# Patient Record
Sex: Male | Born: 1960 | Race: White | Hispanic: No | Marital: Married | Smoking: Never smoker
Health system: Southern US, Community
[De-identification: ages and names within clinical notes are randomized; demographics above are authoritative.]

## PROBLEM LIST (undated history)

## (undated) DIAGNOSIS — E669 Obesity, unspecified: Principal | ICD-10-CM

## (undated) DIAGNOSIS — K922 Gastrointestinal hemorrhage, unspecified: Secondary | ICD-10-CM

## (undated) DIAGNOSIS — I5042 Chronic combined systolic (congestive) and diastolic (congestive) heart failure: Secondary | ICD-10-CM

## (undated) DIAGNOSIS — I255 Ischemic cardiomyopathy: Secondary | ICD-10-CM

## (undated) DIAGNOSIS — I2510 Atherosclerotic heart disease of native coronary artery without angina pectoris: Secondary | ICD-10-CM

## (undated) DIAGNOSIS — M1990 Unspecified osteoarthritis, unspecified site: Secondary | ICD-10-CM

## (undated) DIAGNOSIS — E785 Hyperlipidemia, unspecified: Secondary | ICD-10-CM

## (undated) DIAGNOSIS — G4733 Obstructive sleep apnea (adult) (pediatric): Secondary | ICD-10-CM

## (undated) DIAGNOSIS — I219 Acute myocardial infarction, unspecified: Secondary | ICD-10-CM

## (undated) DIAGNOSIS — Z9989 Dependence on other enabling machines and devices: Secondary | ICD-10-CM

## (undated) DIAGNOSIS — D509 Iron deficiency anemia, unspecified: Secondary | ICD-10-CM

## (undated) HISTORY — DX: Ischemic cardiomyopathy: I25.5

## (undated) HISTORY — DX: Obesity, unspecified: E66.9

## (undated) HISTORY — DX: Hyperlipidemia, unspecified: E78.5

---

## 2013-07-22 ENCOUNTER — Institutional Professional Consult (permissible substitution): Payer: Self-pay | Admitting: Neurology

## 2013-08-01 DIAGNOSIS — G4733 Obstructive sleep apnea (adult) (pediatric): Secondary | ICD-10-CM

## 2013-08-01 DIAGNOSIS — E66811 Obesity, class 1: Secondary | ICD-10-CM

## 2013-08-01 DIAGNOSIS — E669 Obesity, unspecified: Secondary | ICD-10-CM

## 2013-08-01 HISTORY — DX: Obesity, class 1: E66.811

## 2013-08-01 HISTORY — DX: Obstructive sleep apnea (adult) (pediatric): G47.33

## 2013-08-01 HISTORY — DX: Obesity, unspecified: E66.9

## 2013-08-05 ENCOUNTER — Encounter (INDEPENDENT_AMBULATORY_CARE_PROVIDER_SITE_OTHER): Payer: Self-pay

## 2013-08-05 ENCOUNTER — Encounter: Payer: Self-pay | Admitting: Neurology

## 2013-08-05 ENCOUNTER — Ambulatory Visit (INDEPENDENT_AMBULATORY_CARE_PROVIDER_SITE_OTHER): Payer: Managed Care, Other (non HMO) | Admitting: Neurology

## 2013-08-05 VITALS — BP 154/96 | HR 77 | Resp 16 | Ht 71.0 in | Wt 283.0 lb

## 2013-08-05 DIAGNOSIS — E669 Obesity, unspecified: Secondary | ICD-10-CM | POA: Insufficient documentation

## 2013-08-05 DIAGNOSIS — R0989 Other specified symptoms and signs involving the circulatory and respiratory systems: Secondary | ICD-10-CM

## 2013-08-05 DIAGNOSIS — R0609 Other forms of dyspnea: Secondary | ICD-10-CM

## 2013-08-05 DIAGNOSIS — R0683 Snoring: Secondary | ICD-10-CM | POA: Insufficient documentation

## 2013-08-05 NOTE — Patient Instructions (Addendum)
Obesity Obesity is defined as having too much total body fat and a body mass index (BMI) of 30 or more. BMI is an estimate of body fat and is calculated from your height and weight. Obesity happens when you consume more calories than you can burn by exercising or performing daily physical tasks. Prolonged obesity can cause major illnesses or emergencies, such as:   A stroke.  Heart disease.  Diabetes.  Cancer.  Arthritis.  High blood pressure (hypertension).  High cholesterol.  Sleep apnea.  Erectile dysfunction.  Infertility problems. CAUSES   Regularly eating unhealthy foods.  Physical inactivity.  Certain disorders, such as an underactive thyroid (hypothyroidism), Cushing's syndrome, and polycystic ovarian syndrome.  Certain medicines, such as steroids, some depression medicines, and antipsychotics.  Genetics.  Lack of sleep. DIAGNOSIS  A caregiver can diagnose obesity after calculating your BMI. Obesity will be diagnosed if your BMI is 30 or higher.  There are other methods of measuring obesity levels. Some other methods include measuring your skin fold thickness, your waist circumference, and comparing your hip circumference to your waist circumference. TREATMENT  A healthy treatment program includes some or all of the following:  Long-term dietary changes.  Exercise and physical activity.  Behavioral and lifestyle changes.  Medicine only under the supervision of your caregiver. Medicines may help, but only if they are used with diet and exercise programs. An unhealthy treatment program includes:  Fasting.  Fad diets.  Supplements and drugs. These choices do not succeed in long-term weight control.  HOME CARE INSTRUCTIONS   Exercise and perform physical activity as directed by your caregiver. To increase physical activity, try the following:  Use stairs instead of elevators.  Park farther away from store entrances.  Garden, bike, or walk instead of  watching television or using the computer.  Eat healthy, low-calorie foods and drinks on a regular basis. Eat more fruits and vegetables. Use low-calorie cookbooks or take healthy cooking classes.  Limit fast food, sweets, and processed snack foods.  Eat smaller portions.  Keep a daily journal of everything you eat. There are many free websites to help you with this. It may be helpful to measure your foods so you can determine if you are eating the correct portion sizes.  Avoid drinking alcohol. Drink more water and drinks without calories.  Take vitamins and supplements only as recommended by your caregiver.  Weight-loss support groups, Nurse, mental health, counselors, and stress reduction education can also be very helpful. SEEK IMMEDIATE MEDICAL CARE IF:  You have chest pain or tightness.  You have trouble breathing or feel short of breath.  You have weakness or leg numbness.  You feel confused or have trouble talking.  You have sudden changes in your vision. MAKE SURE YOU:  Understand these instructions.  Will watch your condition.  Will get help right away if you are not doing well or get worse. Document Released: 08/25/2004 Document Revised: 01/17/2012 Document Reviewed: 08/24/2011 [facility] Patient Information 2014 [place]. Sleep Apnea Sleep apnea is disorder that affects a person's sleep. A person with sleep apnea has abnormal pauses in their breathing when they sleep. It is hard for them to get a good sleep. This makes a person tired during the day. It also can lead to other physical problems. There are three types of sleep apnea. One type is when breathing stops for a short time because your airway is blocked (obstructive sleep apnea). Another type is when the brain sometimes fails to give the normal  signal to breathe to the muscles that control your breathing (central sleep apnea). The third type is a combination of the other two types. HOME CARE  Do not  sleep on your back. Try to sleep on your side.  Take all medicine as told by your doctor.  Avoid alcohol, calming medicines (sedatives), and depressant drugs.  Try to lose weight if you are overweight. Talk to your doctor about a healthy weight goal. Your doctor may have you use a device that helps to open your airway. It can help you get the air that you need. It is called a positive airway pressure (PAP) device. There are three types of PAP devices:  Continuous positive airway pressure (CPAP) device.  Nasal expiratory positive airway pressure (EPAP) device.  Bilevel positive airway pressure (BPAP) device. MAKE SURE YOU:  Understand these instructions.  Will watch your condition.  Will get help right away if you are not doing well or get worse. Document Released: 04/26/2008 Document Revised: 07/04/2012 Document Reviewed: 11/19/2011 [facility] Patient Information 2014 [place].

## 2013-08-05 NOTE — Progress Notes (Signed)
[facility]  Provider:  Larey Seat, M D  Referring Provider: No ref. provider found Primary Care Physician:  No primary provider on file.  Chief Complaint  Patient presents with  . OSA    NP, paper referral, Rm 10    HPI:  James Mullins is a 53 y.o. cauc. Married , right handed  male .  Today seen here as a referral  from his occupational health provider , Dr. 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   James Mullins is aware that he is a snorer, but this has been mostly bothering his wife not himself. He endorsed some joint pain and some weight gain over the last 5 years, but he has no established health history that would interfere with performance and ability of a Engineer, materials.  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 While evaluated by occupational health doctor for the drivers exam , Dr Eden Emms  noted that the patient had some risk factors including a body mass index of 38, and neck circumference of over 70 inches, and now rule upper airway Mallampati 3-4 and a clinical symptom of severe snoring.  His occupational health exam also reported normal hearing ,  Vision 20/20 uncorrected vision,  and a blood pressure of 130/80. Snoring has become louder over the last 5 years , correlating with weight gain his wife reported.   He has irregular sleep habits, based on irregular work hours. He drove last night until 9 PM, went home and to bed at about 10 PM but was asked to drive possibly an early morning route.  External factors would be put aside, his desire is to go to bed between 10 and 11 PM. He normally falls asleep fairly promptly within 30 minutes or less.  he will have 1 or 2 nocturia breaks at night, but reports to fall back asleep  promptly again. If he has no other obligations he will sleep until 9 AM. During work days he will need an alarm. He does not drink any  caffeinated beverages. His workplace is about 10 minutes drive from his home.  The patient is basically on cold as a driver he may work 6 days 1 week and 3 days another week and  In various shifts. Average nightly sleep time may vary between 6 and 7 hours.  The patient uses snuff tobacco - but he  is a nonsmoker. He drinks  ( dealcoholized ) light beer , about 3 per day ,but not hard liquor. He is not a caffeine , ice tea or soda drinker.  No family history of sleep disorders.      Review of Systems: Out of a complete 14 system review, the patient complains of only the following symptoms, and all other reviewed systems are negative. The patient endorsed the Epworth sleepiness scale at 4 points, the fatigue severity scale at 22 points. Arthralgia .   History   Social History  . Marital Status: Married    Spouse Name: renee    Number of Children: 1  . Years of Education: college   Occupational History  . Not on file.   Social History Main Topics  . Smoking status: Never Smoker   . Smokeless tobacco: Not on file  . Alcohol Use: Yes  . Drug Use: No  . Sexual Activity: Not on file   Other Topics Concern  .  Not on file   Social History Narrative  . No narrative on file    Family History  Problem Relation Age of Onset  . Stroke Father     Past Medical History  Diagnosis Date  . Obesity (BMI 30.0-34.9)   . Snoring disorder 08/05/2013    Past Surgical History  Procedure Laterality Date  . None      No current outpatient prescriptions on file.   No current facility-administered medications for this visit.    Allergies as of 08/05/2013  . (No Known Allergies)    Vitals: BP 154/96  Pulse 77  Resp 16  Ht 5\' 11"  (1.803 m)  Wt 283 lb (128.368 kg)  BMI 39.49 kg/m2 Last Weight:  Wt Readings from Last 1 Encounters:  08/05/13 283 lb (128.368 kg)   Last Height:   Ht Readings from Last 1 Encounters:  08/05/13 5\' 11"  (1.803 m)   BMI  38   Physical  exam:  General: The patient is awake, alert and appears not in acute distress. The patient is well groomed. Head: Normocephalic, atraumatic. Neck is supple. Mallampati 3-4, neck circumference: 17.8 , inches, nasal passage clear.  He has seasonal rhinitis.  Cardiovascular:  Regular rate and rhythm , without  murmurs or carotid bruit, and without distended neck veins. Respiratory: Lungs are clear to auscultation. Skin:  Without evidence of edema, or rash Trunk: BMI is  elevated and patient has normal posture.  Neurologic exam : The patient is awake and alert, oriented to place and time.  Memory subjective  described as intact. There is a normal attention span & concentration ability.  Speech is fluent without dysarthria, dysphonia or aphasia. Mood and affect are appropriate.  Cranial nerves: Pupils are equal and briskly reactive to light. Funduscopic exam without  evidence of pallor or edema. Extraocular movements  in vertical and horizontal planes intact and without nystagmus. Visual fields by finger perimetry are intact. Hearing to finger rub intact.  Facial sensation intact to fine touch. Facial motor strength is symmetric and tongue and uvula move midline.  Motor exam:  Normal tone and normal muscle bulk and symmetric normal strength in all extremities.  Sensory:  Fine touch, pinprick and vibration were tested in all extremities. Proprioception is  normal.  Coordination: Rapid alternating movements in the fingers/hands is normal.  Finger-to-nose maneuver tested and normal without evidence of ataxia, dysmetria or tremor.  Gait and station: Patient walks without assistive device. Deep tendon reflexes: in the  upper and lower extremities are symmetric and intact. Babinski maneuver response is downgoing.   Assessment:  After physical and neurologic examination, review of laboratory studies, imaging, neurophysiology testing and pre-existing records, assessment is   1) patient is at high  risk for OSA.  2) shift work related irregular sleep times.  3) obesity 4) situational elevated blood pressure.   Plan:  Treatment plan and additional workup : Sleep study ASAP for DOT , needs 30 day compliance if placed on CPAP.  Weight loss by diet and exercise information and discussion.  35 minute discussion in visit.

## 2013-08-21 ENCOUNTER — Ambulatory Visit (INDEPENDENT_AMBULATORY_CARE_PROVIDER_SITE_OTHER): Payer: Managed Care, Other (non HMO)

## 2013-08-21 DIAGNOSIS — G4733 Obstructive sleep apnea (adult) (pediatric): Secondary | ICD-10-CM

## 2013-08-21 DIAGNOSIS — E669 Obesity, unspecified: Secondary | ICD-10-CM

## 2013-08-21 DIAGNOSIS — E66811 Obesity, class 1: Secondary | ICD-10-CM

## 2013-08-21 DIAGNOSIS — R0683 Snoring: Secondary | ICD-10-CM

## 2013-08-27 ENCOUNTER — Telehealth: Payer: Self-pay | Admitting: Neurology

## 2013-08-27 DIAGNOSIS — G4733 Obstructive sleep apnea (adult) (pediatric): Secondary | ICD-10-CM

## 2013-08-27 NOTE — Telephone Encounter (Signed)
I called and spoke with the patient about his recent sleep study results. I informed the patient that the study did reveal the diagnosis of moderate obstructive sleep apnea and Dr. Brett Fairy recommend CPAP therapy at home. I will send the order to [facility] and mail the report to the patient with a follow up instruction letter.

## 2013-08-28 ENCOUNTER — Encounter: Payer: Self-pay | Admitting: *Deleted

## 2013-08-30 ENCOUNTER — Telehealth: Payer: Self-pay | Admitting: *Deleted

## 2013-08-30 NOTE — Telephone Encounter (Signed)
Error

## 2015-09-02 DIAGNOSIS — I2510 Atherosclerotic heart disease of native coronary artery without angina pectoris: Secondary | ICD-10-CM

## 2015-09-02 DIAGNOSIS — D509 Iron deficiency anemia, unspecified: Secondary | ICD-10-CM

## 2015-09-02 DIAGNOSIS — I5042 Chronic combined systolic (congestive) and diastolic (congestive) heart failure: Secondary | ICD-10-CM

## 2015-09-02 HISTORY — DX: Iron deficiency anemia, unspecified: D50.9

## 2015-09-02 HISTORY — DX: Atherosclerotic heart disease of native coronary artery without angina pectoris: I25.10

## 2015-09-02 HISTORY — DX: Chronic combined systolic (congestive) and diastolic (congestive) heart failure: I50.42

## 2015-09-12 ENCOUNTER — Inpatient Hospital Stay (HOSPITAL_COMMUNITY)
Admission: EM | Admit: 2015-09-12 | Discharge: 2015-09-14 | DRG: 246 | Disposition: A | Discharge status: Home / Self Care | Payer: Managed Care, Other (non HMO) | Attending: Cardiology | Admitting: Cardiology

## 2015-09-12 ENCOUNTER — Inpatient Hospital Stay (HOSPITAL_COMMUNITY): Payer: Managed Care, Other (non HMO)

## 2015-09-12 ENCOUNTER — Encounter (HOSPITAL_COMMUNITY): Payer: Self-pay

## 2015-09-12 ENCOUNTER — Emergency Department (HOSPITAL_COMMUNITY): Payer: Managed Care, Other (non HMO)

## 2015-09-12 ENCOUNTER — Encounter (HOSPITAL_COMMUNITY)
Admission: EM | Disposition: A | Discharge status: Home / Self Care | Payer: Self-pay | Source: Home / Self Care | Attending: Cardiology

## 2015-09-12 DIAGNOSIS — Z955 Presence of coronary angioplasty implant and graft: Secondary | ICD-10-CM

## 2015-09-12 DIAGNOSIS — I2510 Atherosclerotic heart disease of native coronary artery without angina pectoris: Secondary | ICD-10-CM

## 2015-09-12 DIAGNOSIS — I5041 Acute combined systolic (congestive) and diastolic (congestive) heart failure: Secondary | ICD-10-CM | POA: Diagnosis present

## 2015-09-12 DIAGNOSIS — I2111 ST elevation (STEMI) myocardial infarction involving right coronary artery: Secondary | ICD-10-CM | POA: Diagnosis present

## 2015-09-12 DIAGNOSIS — G4733 Obstructive sleep apnea (adult) (pediatric): Secondary | ICD-10-CM | POA: Diagnosis present

## 2015-09-12 DIAGNOSIS — E669 Obesity, unspecified: Secondary | ICD-10-CM | POA: Diagnosis not present

## 2015-09-12 DIAGNOSIS — Z823 Family history of stroke: Secondary | ICD-10-CM | POA: Diagnosis not present

## 2015-09-12 DIAGNOSIS — D509 Iron deficiency anemia, unspecified: Secondary | ICD-10-CM | POA: Diagnosis present

## 2015-09-12 DIAGNOSIS — I213 ST elevation (STEMI) myocardial infarction of unspecified site: Secondary | ICD-10-CM

## 2015-09-12 DIAGNOSIS — I509 Heart failure, unspecified: Secondary | ICD-10-CM | POA: Diagnosis not present

## 2015-09-12 DIAGNOSIS — D649 Anemia, unspecified: Secondary | ICD-10-CM

## 2015-09-12 DIAGNOSIS — I429 Cardiomyopathy, unspecified: Secondary | ICD-10-CM | POA: Diagnosis present

## 2015-09-12 DIAGNOSIS — Z79899 Other long term (current) drug therapy: Secondary | ICD-10-CM | POA: Diagnosis not present

## 2015-09-12 DIAGNOSIS — I517 Cardiomegaly: Secondary | ICD-10-CM | POA: Diagnosis present

## 2015-09-12 DIAGNOSIS — E66811 Obesity, class 1: Secondary | ICD-10-CM | POA: Diagnosis present

## 2015-09-12 DIAGNOSIS — Z6841 Body Mass Index (BMI) 40.0 and over, adult: Secondary | ICD-10-CM

## 2015-09-12 DIAGNOSIS — I255 Ischemic cardiomyopathy: Secondary | ICD-10-CM | POA: Diagnosis not present

## 2015-09-12 DIAGNOSIS — D500 Iron deficiency anemia secondary to blood loss (chronic): Secondary | ICD-10-CM | POA: Diagnosis not present

## 2015-09-12 HISTORY — PX: CARDIAC CATHETERIZATION: SHX172

## 2015-09-12 HISTORY — DX: Dependence on other enabling machines and devices: Z99.89

## 2015-09-12 HISTORY — DX: Obstructive sleep apnea (adult) (pediatric): G47.33

## 2015-09-12 LAB — COMPREHENSIVE METABOLIC PANEL
ALK PHOS: 55 U/L (ref 38–126)
ALT: 21 U/L (ref 17–63)
ALT: 23 U/L (ref 17–63)
ANION GAP: 9 (ref 5–15)
AST: 24 U/L (ref 15–41)
AST: 49 U/L — AB (ref 15–41)
Albumin: 3.9 g/dL (ref 3.5–5.0)
Albumin: 3.3 g/dL — ABNORMAL LOW (ref 3.5–5.0)
Alkaline Phosphatase: 59 U/L (ref 38–126)
Anion gap: 6 (ref 5–15)
BUN: 8 mg/dL (ref 6–20)
BUN: 6 mg/dL (ref 6–20)
CALCIUM: 8.5 mg/dL — AB (ref 8.9–10.3)
CHLORIDE: 107 mmol/L (ref 101–111)
CHLORIDE: 109 mmol/L (ref 101–111)
CO2: 22 mmol/L (ref 22–32)
CO2: 23 mmol/L (ref 22–32)
CREATININE: 0.93 mg/dL (ref 0.61–1.24)
CREATININE: 0.97 mg/dL (ref 0.61–1.24)
Calcium: 8.4 mg/dL — ABNORMAL LOW (ref 8.9–10.3)
GFR calc Af Amer: >60 mL/min (ref >60)
GFR calc non Af Amer: >60 mL/min (ref >60)
GLUCOSE: 126 mg/dL — AB (ref 65–99)
Glucose, Bld: 101 mg/dL — ABNORMAL HIGH (ref 65–99)
Potassium: 4.3 mmol/L (ref 3.5–5.1)
Potassium: 4.4 mmol/L (ref 3.5–5.1)
SODIUM: 138 mmol/L (ref 135–145)
Sodium: 138 mmol/L (ref 135–145)
Total Bilirubin: 0.8 mg/dL (ref 0.3–1.2)
Total Bilirubin: 0.4 mg/dL (ref 0.3–1.2)
Total Protein: 6.9 g/dL (ref 6.5–8.1)
Total Protein: 5.9 g/dL — ABNORMAL LOW (ref 6.5–8.1)

## 2015-09-12 LAB — CBC
HCT: 32.2 % — ABNORMAL LOW (ref 39.0–52.0)
Hemoglobin: 9.1 g/dL — ABNORMAL LOW (ref 13.0–17.0)
MCH: 19 pg — ABNORMAL LOW (ref 26.0–34.0)
MCHC: 28.3 g/dL — ABNORMAL LOW (ref 30.0–36.0)
MCV: 67.2 fL — ABNORMAL LOW (ref 78.0–100.0)
PLATELETS: 252 10*3/uL (ref 150–400)
RBC: 4.79 MIL/uL (ref 4.22–5.81)
RDW: 17.7 % — AB (ref 11.5–15.5)
WBC: 9.6 10*3/uL (ref 4.0–10.5)

## 2015-09-12 LAB — CBC WITH DIFFERENTIAL/PLATELET
BASOS PCT: 1 %
Basophils Absolute: 0.1 10*3/uL (ref 0.0–0.1)
EOS PCT: 1 %
Eosinophils Absolute: 0.1 10*3/uL (ref 0.0–0.7)
HEMATOCRIT: 32.6 % — AB (ref 39.0–52.0)
Hemoglobin: 9.2 g/dL — ABNORMAL LOW (ref 13.0–17.0)
LYMPHS ABS: 1.2 10*3/uL (ref 0.7–4.0)
Lymphocytes Relative: 17 %
MCH: 19.1 pg — AB (ref 26.0–34.0)
MCHC: 28.2 g/dL — ABNORMAL LOW (ref 30.0–36.0)
MCV: 67.6 fL — ABNORMAL LOW (ref 78.0–100.0)
MONOS PCT: 10 %
Monocytes Absolute: 0.7 10*3/uL (ref 0.1–1.0)
NEUTROS ABS: 4.9 10*3/uL (ref 1.7–7.7)
Neutrophils Relative %: 71 %
Platelets: 256 10*3/uL (ref 150–400)
RBC: 4.82 MIL/uL (ref 4.22–5.81)
RDW: 17.6 % — AB (ref 11.5–15.5)
WBC: 7 10*3/uL (ref 4.0–10.5)

## 2015-09-12 LAB — MAGNESIUM: Magnesium: 2 mg/dL (ref 1.7–2.4)

## 2015-09-12 LAB — IRON AND TIBC
IRON: 23 ug/dL — AB (ref 45–182)
SATURATION RATIOS: 5 % — AB (ref 17.9–39.5)
TIBC: 482 ug/dL — AB (ref 250–450)
UIBC: 459 ug/dL

## 2015-09-12 LAB — TROPONIN I
TROPONIN I: 6.88 ng/mL — AB (ref <0.031)
TROPONIN I: 15.39 ng/mL — AB (ref <0.031)

## 2015-09-12 LAB — RETICULOCYTES
RBC.: 4.79 MIL/uL (ref 4.22–5.81)
Retic Count, Absolute: 71.9 10*3/uL (ref 19.0–186.0)
Retic Ct Pct: 1.5 % (ref 0.4–3.1)

## 2015-09-12 LAB — I-STAT TROPONIN, ED: TROPONIN I, POC: 0.32 ng/mL — AB (ref 0.00–0.08)

## 2015-09-12 LAB — VITAMIN B12: Vitamin B-12: 187 pg/mL (ref 180–914)

## 2015-09-12 LAB — FOLATE: Folate: 45.8 ng/mL (ref >5.9)

## 2015-09-12 LAB — PROTIME-INR
INR: 1.07 (ref 0.00–1.49)
PROTHROMBIN TIME: 13.7 s (ref 11.6–15.2)

## 2015-09-12 LAB — FERRITIN: FERRITIN: 4 ng/mL — AB (ref 24–336)

## 2015-09-12 LAB — MRSA PCR SCREENING: MRSA by PCR: NEGATIVE

## 2015-09-12 LAB — APTT: aPTT: 31 seconds (ref 24–37)

## 2015-09-12 LAB — TSH: TSH: 2.228 u[IU]/mL (ref 0.350–4.500)

## 2015-09-12 SURGERY — LEFT HEART CATH AND CORONARY ANGIOGRAPHY
Anesthesia: LOCAL

## 2015-09-12 MED ORDER — ENOXAPARIN SODIUM 40 MG/0.4ML ~~LOC~~ SOLN: 40.0000 mg | SUBCUTANEOUS | Status: DC

## 2015-09-12 MED ORDER — VERAPAMIL HCL 2.5 MG/ML IV SOLN
INTRAVENOUS | Status: DC | PRN
  Administered 2015-09-12: 12:00:00 via INTRA_ARTERIAL

## 2015-09-12 MED ORDER — HEPARIN SODIUM (PORCINE) 1000 UNIT/ML IJ SOLN
INTRAMUSCULAR | Status: AC
  Filled 2015-09-12: qty 1

## 2015-09-12 MED ORDER — TICAGRELOR 90 MG PO TABS
ORAL_TABLET | ORAL | Status: AC
  Filled 2015-09-12: qty 2

## 2015-09-12 MED ORDER — FENTANYL CITRATE (PF) 100 MCG/2ML IJ SOLN
INTRAMUSCULAR | Status: DC | PRN
  Administered 2015-09-12: 25 ug via INTRAVENOUS

## 2015-09-12 MED ORDER — SODIUM CHLORIDE 0.9% FLUSH
3.0000 mL | Freq: Two times a day (BID) | INTRAVENOUS | Status: DC
Start: 2015-09-12 — End: 2015-09-14
  Administered 2015-09-12 – 2015-09-13 (×2): 3 mL via INTRAVENOUS

## 2015-09-12 MED ORDER — LISINOPRIL 2.5 MG PO TABS
2.5000 mg | ORAL_TABLET | Freq: Every day | ORAL | Status: DC
  Administered 2015-09-12 – 2015-09-13 (×2): 2.5 mg via ORAL
  Filled 2015-09-12 (×2): qty 1

## 2015-09-12 MED ORDER — ENOXAPARIN SODIUM 60 MG/0.6ML ~~LOC~~ SOLN
60.0000 mg | SUBCUTANEOUS | Status: DC
  Administered 2015-09-13: 60 mg via SUBCUTANEOUS
  Filled 2015-09-12: qty 0.6

## 2015-09-12 MED ORDER — VERAPAMIL HCL 2.5 MG/ML IV SOLN
INTRAVENOUS | Status: AC
  Filled 2015-09-12: qty 2

## 2015-09-12 MED ORDER — ACETAMINOPHEN 325 MG PO TABS: 650.0000 mg | ORAL_TABLET | ORAL | Status: DC | PRN

## 2015-09-12 MED ORDER — LIDOCAINE HCL (PF) 1 % IJ SOLN
INTRAMUSCULAR | Status: AC
  Filled 2015-09-12: qty 30

## 2015-09-12 MED ORDER — ASPIRIN 325 MG PO TABS
325.0000 mg | ORAL_TABLET | Freq: Every day | ORAL | Status: DC
  Filled 2015-09-12: qty 1

## 2015-09-12 MED ORDER — PANTOPRAZOLE SODIUM 40 MG PO TBEC
40.0000 mg | DELAYED_RELEASE_TABLET | Freq: Every day | ORAL | Status: DC
  Administered 2015-09-13: 40 mg via ORAL
  Filled 2015-09-12: qty 1

## 2015-09-12 MED ORDER — HEPARIN (PORCINE) IN NACL 100-0.45 UNIT/ML-% IJ SOLN
1750.0000 [IU]/h | INTRAMUSCULAR | Status: DC
  Filled 2015-09-12: qty 250

## 2015-09-12 MED ORDER — LIDOCAINE HCL (PF) 1 % IJ SOLN
INTRAMUSCULAR | Status: DC | PRN
  Administered 2015-09-12: 5 mL

## 2015-09-12 MED ORDER — MIDAZOLAM HCL 2 MG/2ML IJ SOLN
INTRAMUSCULAR | Status: DC | PRN
  Administered 2015-09-12: 1 mg via INTRAVENOUS

## 2015-09-12 MED ORDER — HEPARIN BOLUS VIA INFUSION
3000.0000 [IU] | Freq: Once | INTRAVENOUS | Status: DC
  Filled 2015-09-12: qty 3000

## 2015-09-12 MED ORDER — NITROGLYCERIN 0.4 MG SL SUBL
0.4000 mg | SUBLINGUAL_TABLET | SUBLINGUAL | Status: DC | PRN
  Filled 2015-09-12: qty 1

## 2015-09-12 MED ORDER — ASPIRIN 81 MG PO CHEW
324.0000 mg | CHEWABLE_TABLET | Freq: Once | ORAL | Status: AC
  Administered 2015-09-12: 12:00:00 via ORAL

## 2015-09-12 MED ORDER — TICAGRELOR 90 MG PO TABS
ORAL_TABLET | ORAL | Status: DC | PRN
  Administered 2015-09-12: 180 mg via ORAL

## 2015-09-12 MED ORDER — SODIUM CHLORIDE 0.9% FLUSH: 3.0000 mL | INTRAVENOUS | Status: DC | PRN

## 2015-09-12 MED ORDER — CARVEDILOL 6.25 MG PO TABS
6.2500 mg | ORAL_TABLET | Freq: Two times a day (BID) | ORAL | Status: DC
  Administered 2015-09-12 – 2015-09-14 (×4): 6.25 mg via ORAL
  Filled 2015-09-12 (×5): qty 1

## 2015-09-12 MED ORDER — ONDANSETRON HCL 4 MG/2ML IJ SOLN: 4.0000 mg | Freq: Four times a day (QID) | INTRAMUSCULAR | Status: DC | PRN

## 2015-09-12 MED ORDER — IOHEXOL 350 MG/ML SOLN
INTRAVENOUS | Status: DC | PRN
  Administered 2015-09-12: 145 mL via INTRA_ARTERIAL

## 2015-09-12 MED ORDER — HEPARIN SODIUM (PORCINE) 1000 UNIT/ML IJ SOLN
INTRAMUSCULAR | Status: DC | PRN
  Administered 2015-09-12: 3000 [IU] via INTRAVENOUS
  Administered 2015-09-12: 4000 [IU] via INTRAVENOUS
  Administered 2015-09-12: 10000 [IU] via INTRAVENOUS

## 2015-09-12 MED ORDER — ASPIRIN 81 MG PO CHEW
81.0000 mg | CHEWABLE_TABLET | Freq: Every day | ORAL | Status: DC
  Administered 2015-09-13 – 2015-09-14 (×2): 81 mg via ORAL
  Filled 2015-09-12 (×2): qty 1

## 2015-09-12 MED ORDER — FENTANYL CITRATE (PF) 100 MCG/2ML IJ SOLN
INTRAMUSCULAR | Status: AC
  Filled 2015-09-12: qty 2

## 2015-09-12 MED ORDER — HEPARIN (PORCINE) IN NACL 2-0.9 UNIT/ML-% IJ SOLN
INTRAMUSCULAR | Status: DC | PRN
  Administered 2015-09-12: 13:00:00

## 2015-09-12 MED ORDER — ASPIRIN 81 MG PO CHEW
CHEWABLE_TABLET | ORAL | Status: AC
  Filled 2015-09-12: qty 4

## 2015-09-12 MED ORDER — NITROGLYCERIN 1 MG/10 ML FOR IR/CATH LAB
INTRA_ARTERIAL | Status: AC
  Filled 2015-09-12: qty 10

## 2015-09-12 MED ORDER — HEPARIN (PORCINE) IN NACL 2-0.9 UNIT/ML-% IJ SOLN
INTRAMUSCULAR | Status: AC
  Filled 2015-09-12: qty 1000

## 2015-09-12 MED ORDER — SODIUM CHLORIDE 0.9 % WEIGHT BASED INFUSION: 1.0000 mL/kg/h | INTRAVENOUS | Status: AC

## 2015-09-12 MED ORDER — SODIUM CHLORIDE 0.9 % IV SOLN: 250.0000 mL | INTRAVENOUS | Status: DC | PRN

## 2015-09-12 MED ORDER — TICAGRELOR 90 MG PO TABS
90.0000 mg | ORAL_TABLET | Freq: Two times a day (BID) | ORAL | Status: DC
  Administered 2015-09-12 – 2015-09-14 (×4): 90 mg via ORAL
  Filled 2015-09-12 (×4): qty 1

## 2015-09-12 MED ORDER — ATORVASTATIN CALCIUM 80 MG PO TABS
80.0000 mg | ORAL_TABLET | Freq: Every day | ORAL | Status: DC
  Administered 2015-09-12 – 2015-09-13 (×2): 80 mg via ORAL
  Filled 2015-09-12 (×3): qty 1

## 2015-09-12 MED ORDER — MIDAZOLAM HCL 2 MG/2ML IJ SOLN
INTRAMUSCULAR | Status: AC
  Filled 2015-09-12: qty 2

## 2015-09-12 SURGICAL SUPPLY — 23 items
BALLN TREK RX 2.5X12 (BALLOONS) ×2
BALLN ~~LOC~~ EMERGE MR 4.0X12 (BALLOONS) ×2
BALLOON TREK RX 2.5X12 (BALLOONS) IMPLANT
BALLOON ~~LOC~~ EMERGE MR 4.0X12 (BALLOONS) IMPLANT
CATH INFINITI 5 FR JL3.5 (CATHETERS) ×2 IMPLANT
CATH INFINITI 5FR ANG PIGTAIL (CATHETERS) ×2 IMPLANT
CATH INFINITI JR4 5F (CATHETERS) ×2 IMPLANT
CATH LAUNCHER 5F RADL (CATHETERS) IMPLANT
CATHETER LAUNCHER 5F RADL (CATHETERS) ×2
DEVICE RAD COMP TR BAND LRG (VASCULAR PRODUCTS) ×2 IMPLANT
ELECT DEFIB PAD ADLT CADENCE (PAD) ×1 IMPLANT
GLIDESHEATH SLEND SS 6F .021 (SHEATH) ×2 IMPLANT
GUIDE CATH RUNWAY 6FR AL 1 (CATHETERS) ×1 IMPLANT
HOVERMATT SINGLE USE (MISCELLANEOUS) ×1 IMPLANT
KIT ENCORE 26 ADVANTAGE (KITS) ×1 IMPLANT
KIT HEART LEFT (KITS) ×2 IMPLANT
PACK CARDIAC CATHETERIZATION (CUSTOM PROCEDURE TRAY) ×2 IMPLANT
STENT PROMUS PREM MR 3.5X16 (Permanent Stent) ×1 IMPLANT
SYR MEDRAD MARK V 150ML (SYRINGE) ×2 IMPLANT
TRANSDUCER W/STOPCOCK (MISCELLANEOUS) ×2 IMPLANT
TUBING CIL FLEX 10 FLL-RA (TUBING) ×2 IMPLANT
WIRE ASAHI PROWATER 180CM (WIRE) ×1 IMPLANT
WIRE SAFE-T 1.5MM-J .035X260CM (WIRE) ×2 IMPLANT

## 2015-09-12 NOTE — H&P (Signed)
Physician History and Physical    Patient ID: James Mullins MRN: [id number] DOB/AGE: November 01, 1960 55 y.o. Admit date: 09/12/2015  Primary Care Physician: No primary care provider on file. Primary Cardiologist N/A  HPI: 55 yo WM with history of obesity and OSA on CPAP therapy. No prior cardiac history. No history of HTN or DM. Lipid status is unknown. Presents with 2 day history of intermittent chest and jaw pain. Today jaw pain became more intense and he presented to the [facility] ED. Ecg there showed inferior ST elevation consistent with STEMI. On arrival to our facility he was pain free. No prior bleeding history other than small amount of BRBPR in the past. No prior GI evaluation.  Review of systems complete and found to be negative unless listed above  Past Medical History  Diagnosis Date  . Obesity (BMI 30.0-34.9)   . Snoring disorder 08/05/2013    Family History  Problem Relation Age of Onset  . Stroke Father     Social History   Social History  . Marital Status: Married    Spouse Name: renee  . Number of Children: 1  . Years of Education: college   Occupational History  . Not on file.   Social History Main Topics  . Smoking status: Never Smoker   . Smokeless tobacco: Current User    Types: Chew  . Alcohol Use: Yes  . Drug Use: No  . Sexual Activity: Not on file   Other Topics Concern  . Not on file   Social History Narrative    Past Surgical History  Procedure Laterality Date  . None       Prescriptions prior to admission  Medication Sig Dispense Refill Last Dose  . Multiple Vitamins-Minerals (MULTIVITAMIN WITH MINERALS) tablet Take 1 tablet by mouth daily.   09/12/2015 at Unknown time    Physical Exam: Blood pressure 138/91, pulse 0, temperature 98 F (36.7 C), temperature source Oral, resp. rate 0, height 5\' 9"  (1.753 m), weight 124.739 kg (275 lb), SpO2 0 %. Current Weight  09/12/15 124.739 kg (275 lb)  08/22/13 123.832 kg (273 lb)  08/05/13 128.368  kg (283 lb)    GENERAL:  Well appearing, obese WM in NAD HEENT:  PERRL, EOMI, sclera are clear. Oropharynx is clear. NECK:  No jugular venous distention, carotid upstroke brisk and symmetric, no bruits, no thyromegaly or adenopathy LUNGS:  Clear to auscultation bilaterally CHEST:  Unremarkable HEART:  RRR,  PMI not displaced or sustained,S1 and S2 within normal limits, no S3, no S4: no clicks, no rubs, no murmurs ABD:  Soft, obese, nontender. BS +, no masses or bruits. No hepatomegaly, no splenomegaly EXT:  2 + pulses throughout, no edema, no cyanosis no clubbing SKIN:  Warm and dry.  No rashes NEURO:  Alert and oriented x 3. Cranial nerves II through XII intact. PSYCH:  Cognitively intact    Labs:   Lab Results  Component Value Date   WBC 7.0 09/12/2015   HGB 9.2* 09/12/2015   HCT 32.6* 09/12/2015   MCV 67.6* 09/12/2015   PLT 256 09/12/2015    Recent Labs Lab 09/12/15 1113  NA 138  K 4.3  CL 107  CO2 22  BUN 8  CREATININE 0.93  CALCIUM 8.4*  PROT 6.9  BILITOT 0.8  ALKPHOS 59  ALT 21  AST 24  GLUCOSE 101*   No results found for: CKMB, CKMBINDEX, TROPONINI No results found for: CHOL No results found for: HDL No results found for:  [place] No results found for: TRIG No results found for: CHOLHDL No results found for: LDLDIRECT  No results found for: PROBNP No results found for: TSH No results found for: HGBA1C  Radiology: No results found.  EKG:NSR with PVCs. Inferior ST elevation   ASSESSMENT AND PLAN:  1. Inferior STEMI. Currently pain free. Will proceed with emergent cardiac cath and PCI. Given ASA. Will add beta blocker as BP allows. High dose statin 2. Morbid obesity with OSA 3. Anemia. Unknown etiology. Will heme check stool and send anemia panel.   Signed: Tracie Lindbloom [place], [place]  09/12/2015, 1:35 PM

## 2015-09-12 NOTE — ED Notes (Signed)
Pt states chest, jaw and bilateral shoulder pain off/on since Thursday.  Not associated with food, movement, or lifting heavy equipment.  Pt states some shortness of breath.  No fever.  No cough.  Non diaphoretic.  No cardiac hx.

## 2015-09-12 NOTE — ED Notes (Signed)
Pt changes in EKG.  IV lines established, cone notified, aspirin given.  Pt transported to [facility]

## 2015-09-12 NOTE — Progress Notes (Signed)
   09/12/15 1900  Clinical Encounter Type  Visited With Family  Visit Type Code  Spiritual Encounters  Spiritual Needs Emotional  CH met girlfriend in [place] lab waiting area; Informed RN and offered support as needed.

## 2015-09-12 NOTE — ED Provider Notes (Signed)
CSN: [id number]     Arrival date & time 09/12/15  45 History   First MD Initiated Contact with Patient 09/12/15 1103     Chief Complaint  Patient presents with  . Chest Pain  . Jaw Pain     (Consider location/radiation/quality/duration/timing/severity/associated sxs/prior Treatment) The history is provided by the patient.  James Mullins is a 55 y.o. male history of obesity here presenting with chest pain, jaw pain. Patient states that he has acute onset of substernal chest pain radiating to the jaw as well as left arm starting yesterday. Some mild shortness of breath associated with it. Patient states that this morning it got acutely worse and was 8 out of 10 in radiate down left arm even more. Denies any history of hypertension or hyperlipidemia or diabetes. Patient never smoked and no history of CAD. Took some Motrin with minimal relief.   Level V caveat- condition of patient    Past Medical History  Diagnosis Date  . Obesity (BMI 30.0-34.9)   . Snoring disorder 08/05/2013   Past Surgical History  Procedure Laterality Date  . None     Family History  Problem Relation Age of Onset  . Stroke Father    Social History  Substance Use Topics  . Smoking status: Never Smoker   . Smokeless tobacco: Current User    Types: Chew  . Alcohol Use: Yes    Review of Systems  Cardiovascular: Positive for chest pain.  All other systems reviewed and are negative.     Allergies  Review of patient's allergies indicates no known allergies.  Home Medications   Prior to Admission medications   Not on File   There were no vitals taken for this visit. Physical Exam  Constitutional: He is oriented to person, place, and time.  Uncomfortable   HENT:  Head: Normocephalic.  Mouth/Throat: Oropharynx is clear and moist.  Eyes: Conjunctivae are normal. Pupils are equal, round, and reactive to light.  Neck: Normal range of motion. Neck supple.  Cardiovascular: Normal rate, regular  rhythm and normal heart sounds.   Pulmonary/Chest: Effort normal and breath sounds normal. No respiratory distress. He has no wheezes. He has no rales.  Abdominal: Soft. Bowel sounds are normal. He exhibits no distension. There is no tenderness. There is no rebound.  Musculoskeletal: Normal range of motion. He exhibits no edema or tenderness.  Neurological: He is alert and oriented to person, place, and time. No cranial nerve deficit. Coordination normal.  Skin: Skin is warm and dry.  Psychiatric: He has a normal mood and affect. His behavior is normal. Judgment and thought content normal.  Nursing note and vitals reviewed.   ED Course  Procedures (including critical care time)  CRITICAL CARE Performed by: Darl Householder, Shyloh Krinke   Total critical care time: 30 minutes  Critical care time was exclusive of separately billable procedures and treating other patients.  Critical care was necessary to treat or prevent imminent or life-threatening deterioration.  Critical care was time spent personally by me on the following activities: development of treatment plan with patient and/or surrogate as well as nursing, discussions with consultants, evaluation of patient's response to treatment, examination of patient, obtaining history from patient or surrogate, ordering and performing treatments and interventions, ordering and review of laboratory studies, ordering and review of radiographic studies, pulse oximetry and re-evaluation of patient's condition.   Labs Review Labs Reviewed  CBC WITH DIFFERENTIAL/PLATELET  COMPREHENSIVE METABOLIC PANEL  APTT  PROTIME-INR  I-STAT TROPOININ, ED  Imaging Review No results found. I have personally reviewed and evaluated these images and lab results as part of my medical decision-making.   EKG Interpretation   Date/Time:  Saturday September 12 2015 11:03:16 EST Ventricular Rate:  91 PR Interval:  165 QRS Duration: 109 QT Interval:  347 QTC Calculation:  427 R Axis:   68 Text Interpretation:  Sinus rhythm Multiform ventricular premature  complexes Inferolateral infarct, old Inferior STEMI Confirmed by Jalyn Dutta  MD,  Mihir Flanigan ([id number]) on 09/12/2015 11:17:20 AM      MDM   Final diagnoses:  ST elevation myocardial infarction (STEMI), unspecified artery ([place])   James Mullins is a 55 y.o. male here with chest pain. Has PVCs on the monitor and possible inferior STEMI. Discussed with Dr. Martinique, STEMI doc, who agrees with inferior STEMI diagnosis. Given ASA, nitro, heparin. Will transfer to [facility] for cath. Code STEMI activated. Dr. Billy Fischer in the ED aware.    Wandra Arthurs, MD 09/12/15 1120

## 2015-09-13 DIAGNOSIS — D500 Iron deficiency anemia secondary to blood loss (chronic): Secondary | ICD-10-CM

## 2015-09-13 DIAGNOSIS — I255 Ischemic cardiomyopathy: Secondary | ICD-10-CM

## 2015-09-13 LAB — BASIC METABOLIC PANEL
Anion gap: 10 (ref 5–15)
BUN: 11 mg/dL (ref 6–20)
CALCIUM: 8.3 mg/dL — AB (ref 8.9–10.3)
CHLORIDE: 108 mmol/L (ref 101–111)
CO2: 22 mmol/L (ref 22–32)
CREATININE: 0.95 mg/dL (ref 0.61–1.24)
GFR calc non Af Amer: >60 mL/min (ref >60)
GLUCOSE: 111 mg/dL — AB (ref 65–99)
Potassium: 4.1 mmol/L (ref 3.5–5.1)
Sodium: 140 mmol/L (ref 135–145)

## 2015-09-13 LAB — LIPID PANEL
CHOL/HDL RATIO: 4.4 ratio
CHOLESTEROL: 133 mg/dL (ref 0–200)
HDL: 30 mg/dL — ABNORMAL LOW (ref >40)
LDL Cholesterol: 73 mg/dL (ref 0–99)
Triglycerides: 150 mg/dL — ABNORMAL HIGH (ref <150)
VLDL: 30 mg/dL (ref 0–40)

## 2015-09-13 LAB — CBC
HCT: 28.2 % — ABNORMAL LOW (ref 39.0–52.0)
Hemoglobin: 8.1 g/dL — ABNORMAL LOW (ref 13.0–17.0)
MCH: 19.3 pg — AB (ref 26.0–34.0)
MCHC: 28.7 g/dL — AB (ref 30.0–36.0)
MCV: 67.3 fL — AB (ref 78.0–100.0)
PLATELETS: 219 10*3/uL (ref 150–400)
RBC: 4.19 MIL/uL — AB (ref 4.22–5.81)
RDW: 17.7 % — AB (ref 11.5–15.5)
WBC: 6.6 10*3/uL (ref 4.0–10.5)

## 2015-09-13 LAB — TROPONIN I: Troponin I: 14.55 ng/mL (ref <0.031)

## 2015-09-13 MED ORDER — FERROUS SULFATE 325 (65 FE) MG PO TABS
325.0000 mg | ORAL_TABLET | Freq: Two times a day (BID) | ORAL | Status: DC
  Administered 2015-09-13 – 2015-09-14 (×2): 325 mg via ORAL
  Filled 2015-09-13 (×2): qty 1

## 2015-09-13 MED ORDER — LISINOPRIL 5 MG PO TABS
5.0000 mg | ORAL_TABLET | Freq: Every day | ORAL | Status: DC
  Administered 2015-09-14: 5 mg via ORAL
  Filled 2015-09-13: qty 1

## 2015-09-13 NOTE — Progress Notes (Signed)
Utilization review completed.  

## 2015-09-13 NOTE — Progress Notes (Addendum)
Patient ID: James Mullins, male   DOB: [birth year], 55 y.o.   MRN: [id number]   SUBJECTIVE: Patient presented yesterday with inferior STEMI, had DES to RCA.    No complaints today, no chest pain or dyspnea.   Scheduled Meds: . aspirin  81 mg Oral Daily  . atorvastatin  80 mg Oral q1800  . carvedilol  6.25 mg Oral BID WC  . enoxaparin (LOVENOX) injection  60 mg Subcutaneous Q24H  . lisinopril  2.5 mg Oral Daily  . pantoprazole  40 mg Oral Q1200  . sodium chloride flush  3 mL Intravenous Q12H  . ticagrelor  90 mg Oral BID   Continuous Infusions:  PRN Meds:.sodium chloride, acetaminophen, nitroGLYCERIN, ondansetron (ZOFRAN) IV, sodium chloride flush    Filed Vitals:   09/13/15 0500 09/13/15 0600 09/13/15 0700 09/13/15 0800  BP:  114/80    Pulse: 68 61 75 74  Temp:    98.9 F (37.2 C)  TempSrc:    Oral  Resp: 16 15 19 18   Height:      Weight:      SpO2: 98% 99% 97% 96%    Intake/Output Summary (Last 24 hours) at 09/13/15 0957 Last data filed at 09/13/15 0700  Gross per 24 hour  Intake   1096 ml  Output   3475 ml  Net  -2379 ml    LABS: Basic Metabolic Panel:  Recent Labs  09/12/15 1458 09/13/15 0136  NA 138 140  K 4.4 4.1  CL 109 108  CO2 23 22  GLUCOSE 126* 111*  BUN 6 11  CREATININE 0.97 0.95  CALCIUM 8.5* 8.3*  MG 2.0  --    Liver Function Tests:  Recent Labs  09/12/15 1113 09/12/15 1458  AST 24 49*  ALT 21 23  ALKPHOS 59 55  BILITOT 0.8 0.4  PROT 6.9 5.9*  ALBUMIN 3.9 3.3*   No results for input(s): LIPASE, AMYLASE in the last 72 hours. CBC:  Recent Labs  09/12/15 1113 09/12/15 1458 09/13/15 0136  WBC 7.0 9.6 6.6  NEUTROABS 4.9  --   --   HGB 9.2* 9.1* 8.1*  HCT 32.6* 32.2* 28.2*  MCV 67.6* 67.2* 67.3*  PLT 256 252 219   Cardiac Enzymes:  Recent Labs  09/12/15 1458 09/12/15 1921 09/13/15 0136  TROPONINI 6.88* 15.39* 14.55*   BNP: Invalid input(s): POCBNP D-Dimer: No results for input(s): DDIMER in the last 72  hours. Hemoglobin A1C: No results for input(s): HGBA1C in the last 72 hours. Fasting Lipid Panel:  Recent Labs  09/13/15 0136  CHOL 133  HDL 30*  LDLCALC 73  TRIG 150*  CHOLHDL 4.4   Thyroid Function Tests:  Recent Labs  09/12/15 1458  TSH 2.228   Anemia Panel:  Recent Labs  09/12/15 1458  VITAMINB12 187  FOLATE 45.8  FERRITIN 4*  TIBC 482*  IRON 23*  RETICCTPCT 1.5    RADIOLOGY: Portable Chest X-ray 1 View  09/12/2015  CLINICAL DATA:  ST elevation myocardial infarction. EXAM: PORTABLE CHEST 1 VIEW COMPARISON:  None. FINDINGS: Mild cardiomegaly, accentuated by technique. No failure or consolidation. Negative aortic and hilar contours. IMPRESSION: Mild cardiomegaly without failure. Electronically Signed   By: James Fantasia M.D.   On: 09/12/2015 15:25    PHYSICAL EXAM General: NAD, obese Neck: No JVD, no thyromegaly or thyroid nodule.  Lungs: Clear to auscultation bilaterally with normal respiratory effort. CV: Nondisplaced PMI.  Heart regular S1/S2, no S3/S4, no murmur.  No peripheral edema.  No  carotid bruit.  Normal pedal pulses.  Abdomen: Soft, nontender, no hepatosplenomegaly, no distention.  Neurologic: Alert and oriented x 3.  Psych: Normal affect. Extremities: No clubbing or cyanosis.   TELEMETRY: Reviewed telemetry pt in NSR  ASSESSMENT AND PLAN: 55 yo with history of obesity, OSA presented with inferior MI, now s/p DES to RCA. 1. CAD: Inferior MI with DES to RCA. Doing well, no further chest pain.  - Continue ASA 81 and ticagrelor.   - Continue atorvastatin 80 daily.  - Cardiac rehab consult, ambulate.  2. Cardiomyopathy: EF 35-40% by LV-gram, hypokinesis globally, ?out of proportion to degree of CAD.  Not volume overloaded on exam. - Will need echo today.  - Continue Coreg, can increase lisinopril to 5 mg daily.  3. Anemia: Fe deficiency.  Says he has occasional red blood mixed with stool that he has assumed to be hemorrhoids.  Will send FOBT.   Will start po iron. Will need eventual GI workup, can be as outpatient if hemoglobin remains stable.   Ambulate with CR, may go to telemetry.   James Mullins 09/13/2015 10:01 AM

## 2015-09-14 ENCOUNTER — Inpatient Hospital Stay (HOSPITAL_COMMUNITY): Payer: Managed Care, Other (non HMO)

## 2015-09-14 ENCOUNTER — Encounter (HOSPITAL_COMMUNITY): Payer: Self-pay | Admitting: Cardiology

## 2015-09-14 DIAGNOSIS — E669 Obesity, unspecified: Secondary | ICD-10-CM

## 2015-09-14 DIAGNOSIS — I509 Heart failure, unspecified: Secondary | ICD-10-CM

## 2015-09-14 LAB — POCT ACTIVATED CLOTTING TIME
ACTIVATED CLOTTING TIME: 219 s
Activated Clotting Time: 224 seconds

## 2015-09-14 LAB — BASIC METABOLIC PANEL
ANION GAP: 12 (ref 5–15)
BUN: 10 mg/dL (ref 6–20)
CALCIUM: 8.5 mg/dL — AB (ref 8.9–10.3)
CHLORIDE: 108 mmol/L (ref 101–111)
CO2: 21 mmol/L — AB (ref 22–32)
CREATININE: 0.85 mg/dL (ref 0.61–1.24)
GFR calc non Af Amer: >60 mL/min (ref >60)
Glucose, Bld: 102 mg/dL — ABNORMAL HIGH (ref 65–99)
Potassium: 3.9 mmol/L (ref 3.5–5.1)
SODIUM: 141 mmol/L (ref 135–145)

## 2015-09-14 LAB — CBC
HCT: 28.8 % — ABNORMAL LOW (ref 39.0–52.0)
HEMOGLOBIN: 7.8 g/dL — AB (ref 13.0–17.0)
MCH: 18.2 pg — AB (ref 26.0–34.0)
MCHC: 27.1 g/dL — ABNORMAL LOW (ref 30.0–36.0)
MCV: 67.1 fL — ABNORMAL LOW (ref 78.0–100.0)
PLATELETS: 221 10*3/uL (ref 150–400)
RBC: 4.29 MIL/uL (ref 4.22–5.81)
RDW: 17.7 % — ABNORMAL HIGH (ref 11.5–15.5)
WBC: 5.7 10*3/uL (ref 4.0–10.5)

## 2015-09-14 LAB — POCT I-STAT, CHEM 8
BUN: 6 mg/dL (ref 6–20)
CREATININE: 0.7 mg/dL (ref 0.61–1.24)
Calcium, Ion: 1.13 mmol/L (ref 1.12–1.23)
Chloride: 102 mmol/L (ref 101–111)
GLUCOSE: 92 mg/dL (ref 65–99)
HEMATOCRIT: 31 % — AB (ref 39.0–52.0)
HEMOGLOBIN: 10.5 g/dL — AB (ref 13.0–17.0)
POTASSIUM: 4.2 mmol/L (ref 3.5–5.1)
Sodium: 136 mmol/L (ref 135–145)
TCO2: 21 mmol/L (ref 0–100)

## 2015-09-14 LAB — HEMOGLOBIN A1C
HEMOGLOBIN A1C: 5.4 % (ref 4.8–5.6)
Mean Plasma Glucose: 108 mg/dL

## 2015-09-14 MED ORDER — PANTOPRAZOLE SODIUM 40 MG PO TBEC: 40.0000 mg | DELAYED_RELEASE_TABLET | Freq: Every day | ORAL | Status: DC

## 2015-09-14 MED ORDER — LISINOPRIL 5 MG PO TABS: 5.0000 mg | ORAL_TABLET | Freq: Every day | ORAL | Status: DC

## 2015-09-14 MED ORDER — ATORVASTATIN CALCIUM 80 MG PO TABS: 80.0000 mg | ORAL_TABLET | Freq: Every day | ORAL | Status: DC

## 2015-09-14 MED ORDER — TICAGRELOR 60 MG PO TABS: 60.0000 mg | ORAL_TABLET | Freq: Two times a day (BID) | ORAL | Status: DC

## 2015-09-14 MED ORDER — CARVEDILOL 6.25 MG PO TABS: 6.2500 mg | ORAL_TABLET | Freq: Two times a day (BID) | ORAL | Status: DC

## 2015-09-14 MED ORDER — NITROGLYCERIN 0.4 MG SL SUBL: 0.4000 mg | SUBLINGUAL_TABLET | SUBLINGUAL | Status: DC | PRN

## 2015-09-14 MED ORDER — ASPIRIN 81 MG PO CHEW: 81.0000 mg | CHEWABLE_TABLET | Freq: Every day | ORAL | Status: DC

## 2015-09-14 MED ORDER — TICAGRELOR 90 MG PO TABS: 90.0000 mg | ORAL_TABLET | Freq: Two times a day (BID) | ORAL | Status: DC

## 2015-09-14 MED ORDER — FERROUS SULFATE 325 (65 FE) MG PO TABS: 325.0000 mg | ORAL_TABLET | Freq: Two times a day (BID) | ORAL | Status: DC

## 2015-09-14 MED FILL — CARVEDILOL 6.25 MG TABLET: 6.25 | 30 days supply | Qty: 60 | Fill #0

## 2015-09-14 MED FILL — PANTOPRAZOLE SOD DR 40 MG T: 40 | 30 days supply | Qty: 30 | Fill #0 | Status: TO

## 2015-09-14 MED FILL — ATORVASTATIN 80 MG TABLET: 80 | 30 days supply | Qty: 30 | Fill #0

## 2015-09-14 MED FILL — NITROGLYCERIN 0.4 MG TAB SL: 0.4 | 15 days supply | Qty: 25 | Fill #0

## 2015-09-14 MED FILL — LISINOPRIL 5 MG TABLET: 5 | 30 days supply | Qty: 30 | Fill #0

## 2015-09-14 NOTE — Progress Notes (Signed)
Insurance check completed for Brilinta Pt copay will be $68.41- prior auth not required

## 2015-09-14 NOTE — Progress Notes (Signed)
CARDIAC REHAB PHASE I   PRE:  Rate/Rhythm: 49 SR with PVC    BP: sitting 113/71    SaO2:   MODE:  Ambulation: 690 ft   POST:  Rate/Rhythm: 89 SR with PVCs (increased walking)    BP: sitting 118/65     SaO2:   Tolerated well. Has PVCs. No c/o, feels well. Ed completed. Pt hasn't thought much about quitting tobacco. Is planning to change his diet and exercise. Will send referral to [facility]. Understands importance of Brilinta. Awaiting echo/EF. I encouraged him to weigh daily and cut out salt.  [id number]   James Mullins [place] CES, ACSM 09/14/2015 11:04 AM

## 2015-09-14 NOTE — Progress Notes (Signed)
Patient ID: James Mullins, male   DOB: 1961/03/01, 55 y.o.   MRN: [id number]   SUBJECTIVE: James Mullins is feeling well. He denies any chest pain or shortness of breath. He has been ambulating without difficulty.   Scheduled Meds: . aspirin  81 mg Oral Daily  . atorvastatin  80 mg Oral q1800  . carvedilol  6.25 mg Oral BID WC  . enoxaparin (LOVENOX) injection  60 mg Subcutaneous Q24H  . ferrous sulfate  325 mg Oral BID WC  . lisinopril  5 mg Oral Daily  . pantoprazole  40 mg Oral Q1200  . sodium chloride flush  3 mL Intravenous Q12H  . ticagrelor  90 mg Oral BID   Continuous Infusions:  PRN Meds:.sodium chloride, acetaminophen, nitroGLYCERIN, ondansetron (ZOFRAN) IV, sodium chloride flush    Filed Vitals:   09/13/15 1328 09/13/15 2038 09/14/15 0425 09/14/15 1329  BP: 105/63 116/72 108/64 123/73  Pulse: 51 65 64 71  Temp: 98 F (36.7 C) 98.5 F (36.9 C) 98 F (36.7 C) 98 F (36.7 C)  TempSrc: Oral Oral Oral Oral  Resp: 18 18 18 20   Height:      Weight:      SpO2: 100% 99% 100% 100%    Intake/Output Summary (Last 24 hours) at 09/14/15 1352 Last data filed at 09/14/15 1300  Gross per 24 hour  Intake    720 ml  Output      0 ml  Net    720 ml    LABS: Basic Metabolic Panel:  Recent Labs  09/12/15 1458 09/13/15 0136 09/14/15 0321  NA 138 140 141  K 4.4 4.1 3.9  CL 109 108 108  CO2 23 22 21*  GLUCOSE 126* 111* 102*  BUN 6 11 10   CREATININE 0.97 0.95 0.85  CALCIUM 8.5* 8.3* 8.5*  MG 2.0  --   --    Liver Function Tests:  Recent Labs  09/12/15 1113 09/12/15 1458  AST 24 49*  ALT 21 23  ALKPHOS 59 55  BILITOT 0.8 0.4  PROT 6.9 5.9*  ALBUMIN 3.9 3.3*   No results for input(s): LIPASE, AMYLASE in the last 72 hours. CBC:  Recent Labs  09/12/15 1113  09/13/15 0136 09/14/15 0321  WBC 7.0  < > 6.6 5.7  NEUTROABS 4.9  --   --   --   HGB 9.2*  < > 8.1* 7.8*  HCT 32.6*  < > 28.2* 28.8*  MCV 67.6*  < > 67.3* 67.1*  PLT 256  < > 219 221  < > =  values in this interval not displayed. Cardiac Enzymes:  Recent Labs  09/12/15 1458 09/12/15 1921 09/13/15 0136  TROPONINI 6.88* 15.39* 14.55*   BNP: Invalid input(s): POCBNP D-Dimer: No results for input(s): DDIMER in the last 72 hours. Hemoglobin A1C:  Recent Labs  09/12/15 1458  HGBA1C 5.4   Fasting Lipid Panel:  Recent Labs  09/13/15 0136  CHOL 133  HDL 30*  LDLCALC 73  TRIG 150*  CHOLHDL 4.4   Thyroid Function Tests:  Recent Labs  09/12/15 1458  TSH 2.228   Anemia Panel:  Recent Labs  09/12/15 1458  VITAMINB12 187  FOLATE 45.8  FERRITIN 4*  TIBC 482*  IRON 23*  RETICCTPCT 1.5    RADIOLOGY: Portable Chest X-ray 1 View  09/12/2015  CLINICAL DATA:  ST elevation myocardial infarction. EXAM: PORTABLE CHEST 1 VIEW COMPARISON:  None. FINDINGS: Mild cardiomegaly, accentuated by technique. No failure or consolidation. Negative aortic  and hilar contours. IMPRESSION: Mild cardiomegaly without failure. Electronically Signed   By: Monte Fantasia M.D.   On: 09/12/2015 15:25    PHYSICAL EXAM General: NAD, obese Neck: No JVD, no thyromegaly or thyroid nodule.  Lungs: Clear to auscultation bilaterally with normal respiratory effort. CV: Nondisplaced PMI.  Heart regular S1/S2, no S3/S4, no murmur.  No peripheral edema.  No carotid bruit.  Normal pedal pulses.  Abdomen: Soft, nontender, no hepatosplenomegaly, no distention.  Neurologic: Alert and oriented x 3.  Psych: Normal affect. Extremities: No clubbing or cyanosis.   TELEMETRY: Frequent PVCs.  ASSESSMENT AND PLAN: 55 yo with history of obesity, OSA presented with inferior MI, now s/p DES to RCA.  1. CAD: Inferior MI with DES to RCA. Doing well, no further chest pain.  - Continue ASA 81 and ticagrelor.   - Continue atorvastatin 80 daily.  - Cardiac rehab    2. 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 It also revealed grade 2  diastolic dysfunction. He is euvolemic on exam today. - Continue lisinopril and carvedilol  3. Anemia: Fe deficiency.  Says he has occasional red blood mixed with stool that he has assumed to be hemorrhoids.  Started on iron supplementation.  H/H stable.  He will need an outpatient GI evaluation and a CBC checked in 1 week.    Mehran Guderian C. Oval Linsey, MD, [facility]   09/14/2015 1:52 PM

## 2015-09-14 NOTE — Progress Notes (Signed)
  Echocardiogram 2D Echocardiogram has been performed.  James Mullins 09/14/2015, 9:50 AM

## 2015-09-14 NOTE — Progress Notes (Deleted)
Discharge Summary    Patient ID: James Mullins,  MRN: [id number], DOB/AGE: September 05, 1960 55 y.o.  Admit date: 09/12/2015 Discharge date: 09/14/2015  Primary Care Provider: No primary care provider on file. Primary Cardiologist: Dr. Martinique  Discharge Diagnoses    Principal Problem:   OSA (obstructive sleep apnea) Active Problems:   Obesity (BMI 30.0-34.9)   ST elevation (STEMI) myocardial infarction involving right coronary artery (HCC)   Allergies No Known Allergies  Diagnostic Studies/Procedures    Emergent LHC 09/12/15 Procedures    Coronary Stent Intervention   Left Heart Cath and Coronary Angiography    Conclusion     There is moderate to severe left ventricular systolic dysfunction.  Mid RCA lesion, 90% stenosed. Post intervention, there is a 0% residual stenosis.  1. Single vessel obstructive CAD 2. Moderate to severe LV dysfunction. Out of proportion to CAD 3. Successful stenting of the mid RCA with a DES   2D echo 09/14/15 Study Conclusions  - Left ventricle: The cavity size was mildly dilated. Systolic function was mildly to moderately reduced. The estimated ejection fraction was in the range of 40% to 45%. There is akinesis of the basalinferior myocardium. Features are consistent with a pseudonormal left ventricular filling pattern, with concomitant abnormal relaxation and increased filling pressure (grade 2 diastolic dysfunction). - Mitral valve: There was mild regurgitation. - Left atrium: The atrium was moderately to severely dilated. - Tricuspid valve: There was trivial regurgitation. - Pulmonary arteries: PA peak pressure: 31 mm Hg (S).   History of Present Illness     55 yo WM with history of obesity and OSA on CPAP therapy. No prior cardiac history. No history of HTN or DM. Lipid status is unknown. Presents with 2 day history of intermittent chest and jaw pain. Today jaw pain became more intense and he presented to the  [facility] ED. Ecg there showed inferior ST elevation consistent with STEMI. He was transferred to [facility] for emergent [facility].   Hospital Course     On arrival to [facility], patient was taken urgently to the cath lab for emergent LHC, performed by Dr. Martinique. He was found to have single vessel obstructive CAD. There was 90% mid RCA lesion. This was successfully treated with PCI utilizing a DES. There was moderate to severe LV dysfunction, out of proportion to CAD. EF was estimated at 35-45%. He tolerated the procedure well and left the cath lab in stable condition. He was placed on DAPT with ASA + Brilinta. He was also treated with high intensity statin along with a BB and ACE-I. A 2D echo was obtained post PCI which showed improvement in EF to 40-45%. His volume status remained stable. He also denied any recurrent CP or dyspnea. He had no difficulties ambulating with cardiac rehab and had no post cath complications.   His hospital course is also notable for microcytic anemia with a Hgb of 7.8 and a MCV of 67.6. He reported occasional red blood mixed with stool that he has assumed to be hemorrhoids. He was started on iron supplementation and H/H remained stable. Plan is for repeat office CBC in 1 week followed by outpatient GI evaluation.   The patient was last seen and examined by Dr. Oval Linsey who determined that he was stable for discharge home. Post hospital f/u is arranged in 1 week on 09/21/15. He will be followed long term by Dr. Martinique.   Consultants: none   Discharge Vitals Blood pressure 123/73, pulse 71, temperature 98 F (  36.7 C), temperature source Oral, resp. rate 20, height 5\' 9"  (1.753 m), weight 273 lb 5.9 oz (124 kg), SpO2 100 %.  Filed Weights   09/12/15 1124 09/12/15 1300  Weight: 275 lb (124.739 kg) 273 lb 5.9 oz (124 kg)    Labs & Radiologic Studies     CBC  Recent Labs  09/12/15 1113  09/13/15 0136 09/14/15 0321  WBC 7.0  < > 6.6 5.7  NEUTROABS 4.9  --   --   --   HGB 9.2*  < >  8.1* 7.8*  HCT 32.6*  < > 28.2* 28.8*  MCV 67.6*  < > 67.3* 67.1*  PLT 256  < > 219 221  < > = values in this interval not displayed. Basic Metabolic Panel  Recent Labs  09/12/15 1458 09/13/15 0136 09/14/15 0321  NA 138 140 141  K 4.4 4.1 3.9  CL 109 108 108  CO2 23 22 21*  GLUCOSE 126* 111* 102*  BUN 6 11 10   CREATININE 0.97 0.95 0.85  CALCIUM 8.5* 8.3* 8.5*  MG 2.0  --   --    Liver Function Tests  Recent Labs  09/12/15 1113 09/12/15 1458  AST 24 49*  ALT 21 23  ALKPHOS 59 55  BILITOT 0.8 0.4  PROT 6.9 5.9*  ALBUMIN 3.9 3.3*   No results for input(s): LIPASE, AMYLASE in the last 72 hours. Cardiac Enzymes  Recent Labs  09/12/15 1458 09/12/15 1921 09/13/15 0136  TROPONINI 6.88* 15.39* 14.55*   BNP Invalid input(s): POCBNP D-Dimer No results for input(s): DDIMER in the last 72 hours. Hemoglobin A1C  Recent Labs  09/12/15 1458  HGBA1C 5.4   Fasting Lipid Panel  Recent Labs  09/13/15 0136  CHOL 133  HDL 30*  LDLCALC 73  TRIG 150*  CHOLHDL 4.4   Thyroid Function Tests  Recent Labs  09/12/15 1458  TSH 2.228    Portable Chest X-ray 1 View  09/12/2015  CLINICAL DATA:  ST elevation myocardial infarction. EXAM: PORTABLE CHEST 1 VIEW COMPARISON:  None. FINDINGS: Mild cardiomegaly, accentuated by technique. No failure or consolidation. Negative aortic and hilar contours. IMPRESSION: Mild cardiomegaly without failure. Electronically Signed   By: Monte Fantasia M.D.   On: 09/12/2015 15:25    Disposition   Pt is being discharged home today in good condition.  Follow-up Plans & Appointments    Follow-up Information    Follow up with Lyda Jester, PA-C On 09/21/2015.   Specialties:  Cardiology, Radiology   Why:  3:30 PM (Dr. Doug Sou PA)   Contact information:   [address] [place] [address] [phone]      Discharge Instructions    AMB Referral to Cardiac Rehabilitation - Phase II    Complete by:  As directed    Diagnosis:  Myocardial Infarction     AMB Referral to Phase II Cardiac Rehab    Complete by:  As directed   Diagnosis:   Myocardial Infarction PCI       Diet - low sodium heart healthy    Complete by:  As directed      Diet - low sodium heart healthy    Complete by:  As directed      Diet - low sodium heart healthy    Complete by:  As directed      Increase activity slowly    Complete by:  As directed      Increase activity slowly    Complete by:  As directed      Increase activity slowly    Complete by:  As directed            Discharge Medications   Current Discharge Medication List    START taking these medications   Details  aspirin 81 MG chewable tablet Chew 1 tablet (81 mg total) by mouth daily.    atorvastatin (LIPITOR) 80 MG tablet Take 1 tablet (80 mg total) by mouth daily at 6 PM. Qty: 30 tablet, Refills: 5    carvedilol (COREG) 6.25 MG tablet Take 1 tablet (6.25 mg total) by mouth 2 (two) times daily with a meal. Qty: 60 tablet, Refills: 5    ferrous sulfate 325 (65 FE) MG tablet Take 1 tablet (325 mg total) by mouth 2 (two) times daily with a meal. Qty: 60 tablet, Refills: 3    lisinopril (PRINIVIL,ZESTRIL) 5 MG tablet Take 1 tablet (5 mg total) by mouth daily. Qty: 30 tablet, Refills: 5    nitroGLYCERIN (NITROSTAT) 0.4 MG SL tablet Place 1 tablet (0.4 mg total) under the tongue every 5 (five) minutes as needed for chest pain (CP or SOB). Qty: 25 tablet, Refills: 2    pantoprazole (PROTONIX) 40 MG tablet Take 1 tablet (40 mg total) by mouth daily at 12 noon. Qty: 30 tablet, Refills: 5    !! ticagrelor (BRILINTA) 90 MG TABS tablet Take 1 tablet (90 mg total) by mouth 2 (two) times daily. Qty: 60 tablet, Refills: 10    !! ticagrelor (BRILINTA) 90 MG TABS tablet Take 1 tablet (90 mg total) by mouth 2 (two) times daily. Qty: 60 tablet, Refills: 0     !! - Potential duplicate medications found. Please discuss with provider.    CONTINUE these  medications which have NOT CHANGED   Details  Multiple Vitamins-Minerals (MULTIVITAMIN WITH MINERALS) tablet Take 1 tablet by mouth daily.         Aspirin prescribed at discharge?  Yes High Intensity Statin Prescribed? (Lipitor 40-80mg  or Crestor 20-40mg ): Yes Beta Blocker Prescribed? Yes For EF 45% or less, Was ACEI/ARB Prescribed? Yes ADP Receptor Inhibitor Prescribed? (i.e. Plavix etc.-Includes Medically Managed Patients): Yes For EF <40%, Aldosterone Inhibitor Prescribed? No:  Was EF assessed during THIS hospitalization? Yes Was Cardiac Rehab II ordered? (Included Medically managed Patients): Yes   Outstanding Labs/Studies   CBC in 1 week OP referral to GI  Duration of Discharge Encounter   Greater than 30 minutes including physician time.  Alveta Heimlich, Melvena Vink NP 09/14/2015, 3:10 PM

## 2015-09-14 NOTE — Discharge Summary (Signed)
Discharge Summary    Patient ID: James Mullins,  MRN: [id number], DOB/AGE: April 06, 1961 55 y.o.  Admit date: 09/12/2015 Discharge date: 09/14/2015  Primary Care Provider: No primary care provider on file. Primary Cardiologist: Dr. Martinique  Discharge Diagnoses    Principal Problem:   OSA (obstructive sleep apnea) Active Problems:   Obesity (BMI 30.0-34.9)   ST elevation (STEMI) myocardial infarction involving right coronary artery (HCC)   Allergies No Known Allergies  Diagnostic Studies/Procedures    Emergent LHC 09/12/15 Procedures    Coronary Stent Intervention   Left Heart Cath and Coronary Angiography    Conclusion     There is moderate to severe left ventricular systolic dysfunction.  Mid RCA lesion, 90% stenosed. Post intervention, there is a 0% residual stenosis.  1. Single vessel obstructive CAD 2. Moderate to severe LV dysfunction. Out of proportion to CAD 3. Successful stenting of the mid RCA with a DES   2D echo 09/14/15 Study Conclusions  - Left ventricle: The cavity size was mildly dilated. Systolic function was mildly to moderately reduced. The estimated ejection fraction was in the range of 40% to 45%. There is akinesis of the basalinferior myocardium. Features are consistent with a pseudonormal left ventricular filling pattern, with concomitant abnormal relaxation and increased filling pressure (grade 2 diastolic dysfunction). - Mitral valve: There was mild regurgitation. - Left atrium: The atrium was moderately to severely dilated. - Tricuspid valve: There was trivial regurgitation. - Pulmonary arteries: PA peak pressure: 31 mm Hg (S).   History of Present Illness     55 yo WM with history of obesity and OSA on CPAP therapy. No prior cardiac history. No history of HTN or DM. Lipid status is unknown. Presents with 2 day history of intermittent chest and jaw pain. Today jaw pain became more intense and he presented to the  [facility] ED. Ecg there showed inferior ST elevation consistent with STEMI. He was transferred to [facility] for emergent [facility].   Hospital Course     On arrival to [facility], patient was taken urgently to the cath lab for emergent LHC, performed by Dr. Martinique. He was found to have single vessel obstructive CAD. There was 90% mid RCA lesion. This was successfully treated with PCI utilizing a DES. There was moderate to severe LV dysfunction, out of proportion to CAD. EF was estimated at 35-45%. He tolerated the procedure well and left the cath lab in stable condition. He was placed on DAPT with ASA + Brilinta. He was also treated with high intensity statin along with a BB and ACE-I. A 2D echo was obtained post PCI which showed improvement in EF to 40-45%. His volume status remained stable. He also denied any recurrent CP or dyspnea. He had no difficulties ambulating with cardiac rehab and had no post cath complications.   His hospital course is also notable for microcytic anemia with a Hgb of 7.8 and a MCV of 67.6. He reported occasional red blood mixed with stool that he has assumed to be hemorrhoids. He was started on iron supplementation and H/H remained stable. Plan is for repeat office CBC in 1 week followed by outpatient GI evaluation.   The patient was last seen and examined by Dr. Oval Linsey who determined that he was stable for discharge home. Post hospital f/u is arranged in 1 week on 09/21/15. He will be followed long term by Dr. Martinique.   Consultants: none   Discharge Vitals Blood pressure 123/73, pulse 71, temperature 98 F (  36.7 C), temperature source Oral, resp. rate 20, height 5\' 9"  (1.753 m), weight 273 lb 5.9 oz (124 kg), SpO2 100 %.  Filed Weights   09/12/15 1124 09/12/15 1300  Weight: 275 lb (124.739 kg) 273 lb 5.9 oz (124 kg)    Labs & Radiologic Studies     CBC  Recent Labs  09/12/15 1113  09/13/15 0136 09/14/15 0321  WBC 7.0  < > 6.6 5.7  NEUTROABS 4.9  --   --   --   HGB 9.2*  < >  8.1* 7.8*  HCT 32.6*  < > 28.2* 28.8*  MCV 67.6*  < > 67.3* 67.1*  PLT 256  < > 219 221  < > = values in this interval not displayed. Basic Metabolic Panel  Recent Labs  09/12/15 1458 09/13/15 0136 09/14/15 0321  NA 138 140 141  K 4.4 4.1 3.9  CL 109 108 108  CO2 23 22 21*  GLUCOSE 126* 111* 102*  BUN 6 11 10   CREATININE 0.97 0.95 0.85  CALCIUM 8.5* 8.3* 8.5*  MG 2.0  --   --    Liver Function Tests  Recent Labs  09/12/15 1113 09/12/15 1458  AST 24 49*  ALT 21 23  ALKPHOS 59 55  BILITOT 0.8 0.4  PROT 6.9 5.9*  ALBUMIN 3.9 3.3*   No results for input(s): LIPASE, AMYLASE in the last 72 hours. Cardiac Enzymes  Recent Labs  09/12/15 1458 09/12/15 1921 09/13/15 0136  TROPONINI 6.88* 15.39* 14.55*   BNP Invalid input(s): POCBNP D-Dimer No results for input(s): DDIMER in the last 72 hours. Hemoglobin A1C  Recent Labs  09/12/15 1458  HGBA1C 5.4   Fasting Lipid Panel  Recent Labs  09/13/15 0136  CHOL 133  HDL 30*  LDLCALC 73  TRIG 150*  CHOLHDL 4.4   Thyroid Function Tests  Recent Labs  09/12/15 1458  TSH 2.228    Portable Chest X-ray 1 View  09/12/2015  CLINICAL DATA:  ST elevation myocardial infarction. EXAM: PORTABLE CHEST 1 VIEW COMPARISON:  None. FINDINGS: Mild cardiomegaly, accentuated by technique. No failure or consolidation. Negative aortic and hilar contours. IMPRESSION: Mild cardiomegaly without failure. Electronically Signed   By: Monte Fantasia M.D.   On: 09/12/2015 15:25    Disposition   Pt is being discharged home today in good condition.  Follow-up Plans & Appointments    Follow-up Information    Follow up with Lyda Jester, PA-C On 09/21/2015.   Specialties:  Cardiology, Radiology   Why:  3:30 PM (Dr. Doug Sou PA)   Contact information:   [address] [place] [address] [phone]      Discharge Instructions    AMB Referral to Cardiac Rehabilitation - Phase II    Complete by:  As directed    Diagnosis:  Myocardial Infarction     AMB Referral to Phase II Cardiac Rehab    Complete by:  As directed   Diagnosis:   Myocardial Infarction PCI       Diet - low sodium heart healthy    Complete by:  As directed      Diet - low sodium heart healthy    Complete by:  As directed      Diet - low sodium heart healthy    Complete by:  As directed      Increase activity slowly    Complete by:  As directed      Increase activity slowly    Complete by:  As directed      Increase activity slowly    Complete by:  As directed            Discharge Medications   Current Discharge Medication List    START taking these medications   Details  aspirin 81 MG chewable tablet Chew 1 tablet (81 mg total) by mouth daily.    atorvastatin (LIPITOR) 80 MG tablet Take 1 tablet (80 mg total) by mouth daily at 6 PM. Qty: 30 tablet, Refills: 5    carvedilol (COREG) 6.25 MG tablet Take 1 tablet (6.25 mg total) by mouth 2 (two) times daily with a meal. Qty: 60 tablet, Refills: 5    ferrous sulfate 325 (65 FE) MG tablet Take 1 tablet (325 mg total) by mouth 2 (two) times daily with a meal. Qty: 60 tablet, Refills: 3    lisinopril (PRINIVIL,ZESTRIL) 5 MG tablet Take 1 tablet (5 mg total) by mouth daily. Qty: 30 tablet, Refills: 5    nitroGLYCERIN (NITROSTAT) 0.4 MG SL tablet Place 1 tablet (0.4 mg total) under the tongue every 5 (five) minutes as needed for chest pain (CP or SOB). Qty: 25 tablet, Refills: 2    pantoprazole (PROTONIX) 40 MG tablet Take 1 tablet (40 mg total) by mouth daily at 12 noon. Qty: 30 tablet, Refills: 5    !! ticagrelor (BRILINTA) 90 MG TABS tablet Take 1 tablet (90 mg total) by mouth 2 (two) times daily. Qty: 60 tablet, Refills: 10    !! ticagrelor (BRILINTA) 90 MG TABS tablet Take 1 tablet (90 mg total) by mouth 2 (two) times daily. Qty: 60 tablet, Refills: 0     !! - Potential duplicate medications found. Please discuss with provider.    CONTINUE these  medications which have NOT CHANGED   Details  Multiple Vitamins-Minerals (MULTIVITAMIN WITH MINERALS) tablet Take 1 tablet by mouth daily.         Aspirin prescribed at discharge?  Yes High Intensity Statin Prescribed? (Lipitor 40-80mg  or Crestor 20-40mg ): Yes Beta Blocker Prescribed? Yes For EF 45% or less, Was ACEI/ARB Prescribed? Yes ADP Receptor Inhibitor Prescribed? (i.e. Plavix etc.-Includes Medically Managed Patients): Yes For EF <40%, Aldosterone Inhibitor Prescribed? No:  Was EF assessed during THIS hospitalization? Yes Was Cardiac Rehab II ordered? (Included Medically managed Patients): Yes   Outstanding Labs/Studies   CBC in 1 week OP referral to GI  Duration of Discharge Encounter   Greater than 30 minutes including physician time.  Alveta Heimlich, Shaconda Hajduk NP 09/14/2015, 3:10 PM

## 2015-09-14 NOTE — Care Management Note (Signed)
Case Management Note Marvetta Gibbons RN, BSN Unit 2W-Case Manager [phone]  Patient Details  Name: Costa Mackiewicz Villescas MRN: [id number] Date of Birth: [birth year]  Subjective/Objective:    Pt admitted with Stemi, s/p cath with stent                Action/Plan: PTA pt lived at home with spouse- plan to return home- pt started on Brilinta- insurance check for coverage submitted- Pt copay will be $68.41- prior auth not required -- spoke with pt at bedside- coverage info shared- also gave pt both 30 day free and copay assist cards for Brilinta- pt uses [facility] which does have drug in stock.  No further CM needs noted.    Expected Discharge Date:      09/14/15            Expected Discharge Plan:  Home/Self Care  In-House Referral:     Discharge planning Services  CM Consult, Medication Assistance  Post Acute Care Choice:    Choice offered to:     DME Arranged:    DME Agency:     HH Arranged:    HH Agency:     Status of Service:  Completed, signed off  Medicare Important Message Given:    Date Medicare IM Given:    Medicare IM give by:    Date Additional Medicare IM Given:    Additional Medicare Important Message give by:     If discussed at [place] of Stay Meetings, dates discussed:   Discharge Disposition: Home/self care    Additional Comments:  Dawayne Patricia, RN 09/14/2015, 3:03 PM

## 2015-09-21 ENCOUNTER — Encounter: Payer: Self-pay | Admitting: Cardiology

## 2015-09-21 ENCOUNTER — Ambulatory Visit (INDEPENDENT_AMBULATORY_CARE_PROVIDER_SITE_OTHER): Payer: Managed Care, Other (non HMO) | Admitting: Cardiology

## 2015-09-21 ENCOUNTER — Telehealth: Payer: Self-pay

## 2015-09-21 VITALS — BP 112/62 | HR 60 | Ht 69.0 in | Wt 277.0 lb

## 2015-09-21 DIAGNOSIS — D62 Acute posthemorrhagic anemia: Secondary | ICD-10-CM

## 2015-09-21 DIAGNOSIS — D509 Iron deficiency anemia, unspecified: Secondary | ICD-10-CM

## 2015-09-21 DIAGNOSIS — I5022 Chronic systolic (congestive) heart failure: Secondary | ICD-10-CM

## 2015-09-21 DIAGNOSIS — D649 Anemia, unspecified: Secondary | ICD-10-CM | POA: Insufficient documentation

## 2015-09-21 MED ORDER — CARVEDILOL 6.25 MG PO TABS: 3.1250 mg | ORAL_TABLET | Freq: Two times a day (BID) | ORAL | Status: DC

## 2015-09-21 NOTE — Telephone Encounter (Signed)
Patient called no answer.Left message on personal voice mail I was calling to schedule 6 week f/u with Dr.Jordan.Advised to call me back.

## 2015-09-21 NOTE — Progress Notes (Signed)
09/21/2015 James Mullins   [birth year]  [id number]  Primary Physician No PCP Per Patient Primary Cardiologist: Dr. Martinique   Reason for Visit/CC: [facility] F/u for CAD/ STEMI  HPI:   Patient is a 55 year old male, who presents to clinic today for post-hospital followup after recent admission for acute STEMI. His past medical history is significant for obesity and obstructive sleep apnea on CPAP therapy. He presented to [facility] on 09/12/2015 with complaint of chest and jaw pain and ST elevations consistent with an inferior STEMI. Emergent left heart catheterization by Dr. Martinique revealed an occluded RCA which was successfully treated with PCI utilizing a drug-eluting stent. There was no other residual disease. Left ventricular systolic function was moderately reduced at 40-45%. He was treated with dual antiplatelet therapy with aspirin plus Brilinta, Lipitor, Coreg and lisinopril. During his hospitalization he was also noted to have a microcytic anemia with a hemoglobin of 7.8 and mean corpuscle volume of 67.6. He reported occasional red blood mixed with stool that he has assumed to be hemorrhoids. He was started on iron supplementation and H/H remained stable. Plan is for repeat CBC today + referral for outpatient GI evaluation.   Today in follow-up, he reports that he is well. He denies any recurrent chest or jaw pain. No significant dyspnea. His only complaint is mild dizziness however he denies any syncope/near-syncope. He also notes mild fatigue. He denies any melena. He has been fully compliant with all of his medications including his DAPT. He has been compliant with supplemental iron. He is tolerating Lipitor well without any side effects. He has not had to use any sublingual nitroglycerin.    Current Outpatient Prescriptions  Medication Sig Dispense Refill  . aspirin 81 MG chewable tablet Chew 1 tablet (81 mg total) by mouth daily.    Marland Kitchen atorvastatin (LIPITOR) 80 MG  tablet Take 1 tablet (80 mg total) by mouth daily at 6 PM. 30 tablet 5  . carvedilol (COREG) 6.25 MG tablet Take 1 tablet (6.25 mg total) by mouth 2 (two) times daily with a meal. 60 tablet 5  . ferrous sulfate 325 (65 FE) MG tablet Take 1 tablet (325 mg total) by mouth 2 (two) times daily with a meal. 60 tablet 3  . lisinopril (PRINIVIL,ZESTRIL) 5 MG tablet Take 1 tablet (5 mg total) by mouth daily. 30 tablet 5  . Multiple Vitamins-Minerals (MULTIVITAMIN WITH MINERALS) tablet Take 1 tablet by mouth daily.    . nitroGLYCERIN (NITROSTAT) 0.4 MG SL tablet Place 1 tablet (0.4 mg total) under the tongue every 5 (five) minutes as needed for chest pain (CP or SOB). 25 tablet 2  . pantoprazole (PROTONIX) 40 MG tablet Take 1 tablet (40 mg total) by mouth daily at 12 noon. 30 tablet 5  . BRILINTA 60 MG TABS tablet Take 60 mg by mouth 2 (two) times daily. Take 1 tab by mouth twice a day  0   No current facility-administered medications for this visit.    No Known Allergies  Social History   Social History  . Marital Status: Married    Spouse Name: renee  . Number of Children: 1  . Years of Education: college   Occupational History  . truck driver    Social History Main Topics  . Smoking status: Never Smoker   . Smokeless tobacco: Current User    Types: Chew  . Alcohol Use: Yes  . Drug Use: No  . Sexual Activity: Not on file  Other Topics Concern  . Not on file   Social History Narrative     Review of Systems: General: negative for chills, fever, night sweats or weight changes.  Cardiovascular: negative for chest pain, dyspnea on exertion, edema, orthopnea, palpitations, paroxysmal nocturnal dyspnea or shortness of breath Dermatological: negative for rash Respiratory: negative for cough or wheezing Urologic: negative for hematuria Abdominal: negative for nausea, vomiting, diarrhea, bright red blood per rectum, melena, or hematemesis Neurologic: negative for visual changes,  syncope, or dizziness All other systems reviewed and are otherwise negative except as noted above.    Blood pressure 112/62, pulse 60, height 5\' 9"  (1.753 m), weight 277 lb (125.646 kg).  General appearance: alert, cooperative and no distress Neck: no carotid bruit and no JVD Lungs: clear to auscultation bilaterally Heart: regular rate and rhythm, S1, S2 normal, no murmur, click, rub or gallop Extremities: no LEE Pulses: 2+ and symmetric Skin: warm and dry Neurologic: Grossly normal   ASSESSMENT AND PLAN:   1. CAD: Status post inferior STEMI, subsequent to severe single-vessel disease with RCA occlusion. Status post PCI plus drug-eluting stent to RCA. No residual CAD. He denies recurrent chest pain. Continue dual antiplatelet therapy with aspirin plus Brilinta for a minimum of one year for newly placed drug-eluting stent. Continue high potency statin, beta blocker and ACE inhibitor.   2. HTN: well-controlled however patient reports symptoms of dizziness and fatigue. His blood pressure today in clinic is 112/62 which he reports is low for him. His baseline is usually in the [id number] systolic. His HR is 60 bpm. Given his symptoms, we will reduce Coreg dose to 3.125 mg BID.   3. Chronic Diastolic CHF: EF [id number]. He is euvolemic on physical exam. No dyspnea. Continue BB and ACE-I. Low sodium diet advised.   4. HLD: continue high dose Lipitor, 80 mg daily. LDL 73 mg/dL. Recheck HFTs in 6 weeks.   5. Anemia: Hgb was 7.8 at time of discharge. He is on DAPT for newly placed DES. Per MD recommendations, we will recheck a CBC today and will arrange OP GI consultation. He is to continue supplemental Fe and Protonix. He does have a family h/o colon cancer. His mother was diagnosed and died from metastatic colon cancer at age 64 y/o. He has never had a colonoscopy. Given his need for uninterrupted DAPT, this will need to be delayed for now, but he should still establish care with GI so that this can be  followed.    PLAN  F/u with Dr. Martinique in 4-6 weeks.   Lyda Jester PA-C 09/21/2015 3:46 PM

## 2015-09-21 NOTE — Patient Instructions (Signed)
Medication Instructions:   DECREASE CARVEDILOL TO 3.125 MG TWICE DAILY= 1/2 OF THE 6.25 MG TABLET TWICE DAILY  Labwork:  Your physician recommends that you HAVE LAB WORK TODAY  Follow-Up:  Your physician recommends that you schedule a follow-up appointment in: 4 TO 6  WEEKS WITH DR Martinique   REFERRAL TO GI FOR ANEMIA

## 2015-09-22 ENCOUNTER — Telehealth: Payer: Self-pay

## 2015-09-22 LAB — CBC
HEMATOCRIT: 31.7 % — AB (ref 39.0–52.0)
Hemoglobin: 9 g/dL — ABNORMAL LOW (ref 13.0–17.0)
MCH: 19.7 pg — AB (ref 26.0–34.0)
MCHC: 28.4 g/dL — AB (ref 30.0–36.0)
MCV: 69.4 fL — AB (ref 78.0–100.0)
MPV: 9.9 fL (ref 8.6–12.4)
Platelets: 287 10*3/uL (ref 150–400)
RBC: 4.57 MIL/uL (ref 4.22–5.81)
RDW: 21.1 % — AB (ref 11.5–15.5)
WBC: 7.2 10*3/uL (ref 4.0–10.5)

## 2015-09-22 NOTE — Telephone Encounter (Signed)
Spoke to patient appointment scheduled with Dr.Jordan 11/20/2015 at 8:15 am.

## 2015-10-02 MED FILL — ATORVASTATIN 80 MG TABLET: 80 | 30 days supply | Qty: 30 | Fill #1 | Status: TO

## 2015-10-02 MED FILL — LISINOPRIL 5 MG TABLET: 5 | 30 days supply | Qty: 30 | Fill #1 | Status: TO

## 2015-10-02 MED FILL — BRILINTA 90 MG TABLET: 90 | 30 days supply | Qty: 60 | Fill #0

## 2015-10-02 MED FILL — PANTOPRAZOLE SOD DR 40 MG T: 40 | 30 days supply | Qty: 30 | Fill #1

## 2015-10-02 MED FILL — CARVEDILOL 6.25 MG TABLET: 6.25 | 30 days supply | Qty: 60 | Fill #1 | Status: TO

## 2015-10-29 ENCOUNTER — Encounter (HOSPITAL_COMMUNITY): Payer: Self-pay | Admitting: Emergency Medicine

## 2015-10-29 ENCOUNTER — Emergency Department (HOSPITAL_COMMUNITY): Payer: Managed Care, Other (non HMO)

## 2015-10-29 ENCOUNTER — Inpatient Hospital Stay (HOSPITAL_COMMUNITY): Payer: Managed Care, Other (non HMO)

## 2015-10-29 ENCOUNTER — Inpatient Hospital Stay (HOSPITAL_COMMUNITY): Payer: Managed Care, Other (non HMO) | Admitting: Certified Registered"

## 2015-10-29 ENCOUNTER — Encounter (HOSPITAL_COMMUNITY)
Admission: EM | Disposition: A | Discharge status: Home / Self Care | Payer: Self-pay | Source: Home / Self Care | Attending: Internal Medicine

## 2015-10-29 ENCOUNTER — Inpatient Hospital Stay (HOSPITAL_COMMUNITY)
Admission: EM | Admit: 2015-10-29 | Discharge: 2015-11-06 | DRG: 378 | Disposition: A | Discharge status: Home / Self Care | Payer: Managed Care, Other (non HMO) | Attending: Internal Medicine | Admitting: Internal Medicine

## 2015-10-29 DIAGNOSIS — M10072 Idiopathic gout, left ankle and foot: Secondary | ICD-10-CM | POA: Diagnosis present

## 2015-10-29 DIAGNOSIS — K2961 Other gastritis with bleeding: Secondary | ICD-10-CM | POA: Diagnosis not present

## 2015-10-29 DIAGNOSIS — D509 Iron deficiency anemia, unspecified: Secondary | ICD-10-CM | POA: Diagnosis not present

## 2015-10-29 DIAGNOSIS — I2111 ST elevation (STEMI) myocardial infarction involving right coronary artery: Secondary | ICD-10-CM | POA: Diagnosis not present

## 2015-10-29 DIAGNOSIS — E669 Obesity, unspecified: Secondary | ICD-10-CM | POA: Diagnosis present

## 2015-10-29 DIAGNOSIS — I2510 Atherosclerotic heart disease of native coronary artery without angina pectoris: Secondary | ICD-10-CM | POA: Diagnosis present

## 2015-10-29 DIAGNOSIS — K317 Polyp of stomach and duodenum: Secondary | ICD-10-CM | POA: Diagnosis present

## 2015-10-29 DIAGNOSIS — D62 Acute posthemorrhagic anemia: Secondary | ICD-10-CM

## 2015-10-29 DIAGNOSIS — C49A0 Gastrointestinal stromal tumor, unspecified site: Secondary | ICD-10-CM | POA: Diagnosis not present

## 2015-10-29 DIAGNOSIS — Z7982 Long term (current) use of aspirin: Secondary | ICD-10-CM | POA: Diagnosis not present

## 2015-10-29 DIAGNOSIS — E66811 Obesity, class 1: Secondary | ICD-10-CM | POA: Diagnosis present

## 2015-10-29 DIAGNOSIS — M109 Gout, unspecified: Secondary | ICD-10-CM | POA: Insufficient documentation

## 2015-10-29 DIAGNOSIS — E785 Hyperlipidemia, unspecified: Secondary | ICD-10-CM | POA: Diagnosis present

## 2015-10-29 DIAGNOSIS — Z800 Family history of malignant neoplasm of digestive organs: Secondary | ICD-10-CM | POA: Diagnosis not present

## 2015-10-29 DIAGNOSIS — Z823 Family history of stroke: Secondary | ICD-10-CM

## 2015-10-29 DIAGNOSIS — I255 Ischemic cardiomyopathy: Secondary | ICD-10-CM | POA: Diagnosis present

## 2015-10-29 DIAGNOSIS — K319 Disease of stomach and duodenum, unspecified: Secondary | ICD-10-CM | POA: Diagnosis not present

## 2015-10-29 DIAGNOSIS — I5042 Chronic combined systolic (congestive) and diastolic (congestive) heart failure: Secondary | ICD-10-CM | POA: Diagnosis present

## 2015-10-29 DIAGNOSIS — K921 Melena: Secondary | ICD-10-CM | POA: Diagnosis not present

## 2015-10-29 DIAGNOSIS — K3189 Other diseases of stomach and duodenum: Secondary | ICD-10-CM | POA: Diagnosis not present

## 2015-10-29 DIAGNOSIS — Z955 Presence of coronary angioplasty implant and graft: Secondary | ICD-10-CM | POA: Diagnosis not present

## 2015-10-29 DIAGNOSIS — R112 Nausea with vomiting, unspecified: Secondary | ICD-10-CM | POA: Diagnosis present

## 2015-10-29 DIAGNOSIS — I10 Essential (primary) hypertension: Secondary | ICD-10-CM | POA: Diagnosis not present

## 2015-10-29 DIAGNOSIS — D500 Iron deficiency anemia secondary to blood loss (chronic): Secondary | ICD-10-CM | POA: Diagnosis not present

## 2015-10-29 DIAGNOSIS — K920 Hematemesis: Secondary | ICD-10-CM | POA: Diagnosis present

## 2015-10-29 DIAGNOSIS — I110 Hypertensive heart disease with heart failure: Secondary | ICD-10-CM | POA: Diagnosis present

## 2015-10-29 DIAGNOSIS — C49A3 Gastrointestinal stromal tumor of small intestine: Secondary | ICD-10-CM | POA: Diagnosis not present

## 2015-10-29 DIAGNOSIS — K922 Gastrointestinal hemorrhage, unspecified: Secondary | ICD-10-CM | POA: Diagnosis present

## 2015-10-29 DIAGNOSIS — D490 Neoplasm of unspecified behavior of digestive system: Secondary | ICD-10-CM | POA: Diagnosis not present

## 2015-10-29 DIAGNOSIS — Z79899 Other long term (current) drug therapy: Secondary | ICD-10-CM | POA: Diagnosis not present

## 2015-10-29 DIAGNOSIS — G4733 Obstructive sleep apnea (adult) (pediatric): Secondary | ICD-10-CM | POA: Diagnosis present

## 2015-10-29 DIAGNOSIS — I252 Old myocardial infarction: Secondary | ICD-10-CM

## 2015-10-29 DIAGNOSIS — R059 Cough, unspecified: Secondary | ICD-10-CM | POA: Diagnosis present

## 2015-10-29 DIAGNOSIS — K254 Chronic or unspecified gastric ulcer with hemorrhage: Secondary | ICD-10-CM | POA: Diagnosis not present

## 2015-10-29 DIAGNOSIS — R05 Cough: Secondary | ICD-10-CM | POA: Diagnosis present

## 2015-10-29 DIAGNOSIS — Z6841 Body Mass Index (BMI) 40.0 and over, adult: Secondary | ICD-10-CM | POA: Diagnosis not present

## 2015-10-29 DIAGNOSIS — D649 Anemia, unspecified: Secondary | ICD-10-CM | POA: Diagnosis present

## 2015-10-29 DIAGNOSIS — D481 Neoplasm of uncertain behavior of connective and other soft tissue: Secondary | ICD-10-CM | POA: Diagnosis not present

## 2015-10-29 HISTORY — DX: Chronic combined systolic (congestive) and diastolic (congestive) heart failure: I50.42

## 2015-10-29 HISTORY — DX: Gastrointestinal hemorrhage, unspecified: K92.2

## 2015-10-29 HISTORY — DX: Acute myocardial infarction, unspecified: I21.9

## 2015-10-29 HISTORY — DX: Iron deficiency anemia, unspecified: D50.9

## 2015-10-29 HISTORY — PX: ESOPHAGOGASTRODUODENOSCOPY: SHX5428

## 2015-10-29 HISTORY — DX: Atherosclerotic heart disease of native coronary artery without angina pectoris: I25.10

## 2015-10-29 LAB — CBC WITH DIFFERENTIAL/PLATELET
BASOS ABS: 0 10*3/uL (ref 0.0–0.1)
Basophils Relative: 0 %
EOS ABS: 0 10*3/uL (ref 0.0–0.7)
Eosinophils Relative: 0 %
HCT: 20.2 % — ABNORMAL LOW (ref 39.0–52.0)
Hemoglobin: 6.5 g/dL — CL (ref 13.0–17.0)
LYMPHS PCT: 17 %
Lymphs Abs: 1.5 10*3/uL (ref 0.7–4.0)
MCH: 26.4 pg (ref 26.0–34.0)
MCHC: 32.2 g/dL (ref 30.0–36.0)
MCV: 82.1 fL (ref 78.0–100.0)
MONOS PCT: 6 %
Monocytes Absolute: 0.5 10*3/uL (ref 0.1–1.0)
NEUTROS ABS: 6.7 10*3/uL (ref 1.7–7.7)
NEUTROS PCT: 77 %
PLATELETS: 240 10*3/uL (ref 150–400)
RBC: 2.46 MIL/uL — AB (ref 4.22–5.81)
RDW: 24.9 % — AB (ref 11.5–15.5)
WBC: 8.7 10*3/uL (ref 4.0–10.5)

## 2015-10-29 LAB — URINALYSIS, ROUTINE W REFLEX MICROSCOPIC
Bilirubin Urine: NEGATIVE
GLUCOSE, UA: NEGATIVE mg/dL
Hgb urine dipstick: NEGATIVE
Ketones, ur: NEGATIVE mg/dL
LEUKOCYTES UA: NEGATIVE
NITRITE: NEGATIVE
PH: 5 (ref 5.0–8.0)
PROTEIN: NEGATIVE mg/dL
Specific Gravity, Urine: 1.022 (ref 1.005–1.030)

## 2015-10-29 LAB — COMPREHENSIVE METABOLIC PANEL
ALBUMIN: 2.8 g/dL — AB (ref 3.5–5.0)
ALK PHOS: 45 U/L (ref 38–126)
ALT: 17 U/L (ref 17–63)
ANION GAP: 5 (ref 5–15)
AST: 19 U/L (ref 15–41)
BILIRUBIN TOTAL: 0.2 mg/dL — AB (ref 0.3–1.2)
BUN: 34 mg/dL — ABNORMAL HIGH (ref 6–20)
CALCIUM: 7.7 mg/dL — AB (ref 8.9–10.3)
CO2: 21 mmol/L — ABNORMAL LOW (ref 22–32)
Chloride: 112 mmol/L — ABNORMAL HIGH (ref 101–111)
Creatinine, Ser: 0.77 mg/dL (ref 0.61–1.24)
GFR calc non Af Amer: >60 mL/min (ref >60)
GLUCOSE: 136 mg/dL — AB (ref 65–99)
POTASSIUM: 4.8 mmol/L (ref 3.5–5.1)
SODIUM: 138 mmol/L (ref 135–145)
TOTAL PROTEIN: 5 g/dL — AB (ref 6.5–8.1)

## 2015-10-29 LAB — ABO/RH: ABO/RH(D): O POS

## 2015-10-29 LAB — PREPARE RBC (CROSSMATCH)

## 2015-10-29 LAB — INFLUENZA PANEL BY PCR (TYPE A & B)
H1N1FLUPCR: NOT DETECTED
INFLAPCR: NEGATIVE
Influenza B By PCR: NEGATIVE

## 2015-10-29 LAB — I-STAT TROPONIN, ED: Troponin i, poc: 0.01 ng/mL (ref 0.00–0.08)

## 2015-10-29 LAB — BRAIN NATRIURETIC PEPTIDE: B Natriuretic Peptide: 28.9 pg/mL (ref 0.0–100.0)

## 2015-10-29 LAB — APTT: aPTT: 27 seconds (ref 24–37)

## 2015-10-29 LAB — PROTIME-INR
INR: 1.1 (ref 0.00–1.49)
PROTHROMBIN TIME: 14.4 s (ref 11.6–15.2)

## 2015-10-29 SURGERY — EGD (ESOPHAGOGASTRODUODENOSCOPY)
Anesthesia: General

## 2015-10-29 MED ORDER — IOPAMIDOL (ISOVUE-300) INJECTION 61%
100.0000 mL | Freq: Once | INTRAVENOUS | Status: AC | PRN
  Administered 2015-10-29: 100 mL via INTRAVENOUS

## 2015-10-29 MED ORDER — SODIUM CHLORIDE 0.9 % IV SOLN
10.0000 mL/h | Freq: Once | INTRAVENOUS | Status: AC
  Administered 2015-10-29: 10 mL/h via INTRAVENOUS

## 2015-10-29 MED ORDER — PROPOFOL 10 MG/ML IV BOLUS
INTRAVENOUS | Status: DC | PRN
  Administered 2015-10-29: 150 mg via INTRAVENOUS

## 2015-10-29 MED ORDER — NITROGLYCERIN 0.4 MG SL SUBL: 0.4000 mg | SUBLINGUAL_TABLET | SUBLINGUAL | Status: DC | PRN

## 2015-10-29 MED ORDER — FENTANYL CITRATE (PF) 100 MCG/2ML IJ SOLN
INTRAMUSCULAR | Status: AC
  Filled 2015-10-29: qty 2

## 2015-10-29 MED ORDER — PANTOPRAZOLE SODIUM 40 MG IV SOLR: 40.0000 mg | Freq: Two times a day (BID) | INTRAVENOUS | Status: DC

## 2015-10-29 MED ORDER — SODIUM CHLORIDE 0.9 % IV SOLN
INTRAVENOUS | Status: DC
  Administered 2015-10-29 – 2015-11-03 (×8): via INTRAVENOUS

## 2015-10-29 MED ORDER — LIDOCAINE HCL (CARDIAC) 20 MG/ML IV SOLN
INTRAVENOUS | Status: DC | PRN
  Administered 2015-10-29: 100 mg via INTRAVENOUS

## 2015-10-29 MED ORDER — CARVEDILOL 3.125 MG PO TABS
3.1250 mg | ORAL_TABLET | Freq: Two times a day (BID) | ORAL | Status: DC
  Administered 2015-10-30 – 2015-11-06 (×13): 3.125 mg via ORAL
  Filled 2015-10-29 (×18): qty 1

## 2015-10-29 MED ORDER — ACETAMINOPHEN 325 MG PO TABS: 650.0000 mg | ORAL_TABLET | Freq: Four times a day (QID) | ORAL | Status: DC | PRN

## 2015-10-29 MED ORDER — SODIUM CHLORIDE 0.9 % IV SOLN
8.0000 mg/h | INTRAVENOUS | Status: AC
  Administered 2015-10-29 – 2015-11-01 (×5): 8 mg/h via INTRAVENOUS
  Filled 2015-10-29 (×13): qty 80

## 2015-10-29 MED ORDER — PROPOFOL 10 MG/ML IV BOLUS
INTRAVENOUS | Status: AC
  Filled 2015-10-29: qty 20

## 2015-10-29 MED ORDER — FENTANYL CITRATE (PF) 100 MCG/2ML IJ SOLN
INTRAMUSCULAR | Status: DC | PRN
  Administered 2015-10-29: 100 ug via INTRAVENOUS

## 2015-10-29 MED ORDER — DM-GUAIFENESIN ER 30-600 MG PO TB12
1.0000 | ORAL_TABLET | Freq: Two times a day (BID) | ORAL | Status: DC
  Administered 2015-10-29 – 2015-11-06 (×16): 1 via ORAL
  Filled 2015-10-29 (×18): qty 1

## 2015-10-29 MED ORDER — PANTOPRAZOLE SODIUM 40 MG IV SOLR
40.0000 mg | Freq: Once | INTRAVENOUS | Status: AC
  Administered 2015-10-29: 40 mg via INTRAVENOUS
  Filled 2015-10-29: qty 40

## 2015-10-29 MED ORDER — SUCRALFATE 1 GM/10ML PO SUSP
1.0000 g | Freq: Three times a day (TID) | ORAL | Status: DC
  Filled 2015-10-29 (×3): qty 10

## 2015-10-29 MED ORDER — SUCCINYLCHOLINE CHLORIDE 20 MG/ML IJ SOLN
INTRAMUSCULAR | Status: DC | PRN
  Administered 2015-10-29: 100 mg via INTRAVENOUS

## 2015-10-29 MED ORDER — SODIUM CHLORIDE 0.9 % IV SOLN: INTRAVENOUS | Status: DC

## 2015-10-29 MED ORDER — PANTOPRAZOLE SODIUM 40 MG IV SOLR: 40.0000 mg | Freq: Once | INTRAVENOUS | Status: DC

## 2015-10-29 MED ORDER — LIDOCAINE HCL (CARDIAC) 20 MG/ML IV SOLN
INTRAVENOUS | Status: AC
  Filled 2015-10-29: qty 5

## 2015-10-29 MED ORDER — ONDANSETRON HCL 4 MG/2ML IJ SOLN: 4.0000 mg | Freq: Four times a day (QID) | INTRAMUSCULAR | Status: DC | PRN

## 2015-10-29 MED ORDER — SODIUM CHLORIDE 0.9% FLUSH
3.0000 mL | Freq: Two times a day (BID) | INTRAVENOUS | Status: DC
  Administered 2015-10-29 – 2015-11-06 (×10): 3 mL via INTRAVENOUS

## 2015-10-29 MED ORDER — ADULT MULTIVITAMIN W/MINERALS CH
1.0000 | ORAL_TABLET | Freq: Every day | ORAL | Status: DC
  Administered 2015-10-30 – 2015-11-06 (×8): 1 via ORAL
  Filled 2015-10-29 (×8): qty 1

## 2015-10-29 MED ORDER — FERROUS SULFATE 325 (65 FE) MG PO TABS
325.0000 mg | ORAL_TABLET | Freq: Two times a day (BID) | ORAL | Status: DC
  Filled 2015-10-29 (×2): qty 1

## 2015-10-29 MED ORDER — SODIUM CHLORIDE 0.9 % IV BOLUS (SEPSIS)
1000.0000 mL | Freq: Once | INTRAVENOUS | Status: AC
  Administered 2015-10-29: 1000 mL via INTRAVENOUS

## 2015-10-29 MED ORDER — ACETAMINOPHEN 650 MG RE SUPP: 650.0000 mg | Freq: Four times a day (QID) | RECTAL | Status: DC | PRN

## 2015-10-29 MED ORDER — ONDANSETRON HCL 4 MG PO TABS: 4.0000 mg | ORAL_TABLET | Freq: Four times a day (QID) | ORAL | Status: DC | PRN

## 2015-10-29 MED ORDER — IOHEXOL 300 MG/ML  SOLN
50.0000 mL | Freq: Once | INTRAMUSCULAR | Status: AC | PRN
Start: 2015-10-29 — End: 2015-10-29
  Administered 2015-10-29: 50 mL via ORAL

## 2015-10-29 MED ORDER — ATORVASTATIN CALCIUM 40 MG PO TABS
80.0000 mg | ORAL_TABLET | Freq: Every day | ORAL | Status: DC
  Administered 2015-10-30 – 2015-11-06 (×8): 80 mg via ORAL
  Filled 2015-10-29: qty 2
  Filled 2015-10-29: qty 1
  Filled 2015-10-29 (×8): qty 2

## 2015-10-29 NOTE — ED Notes (Signed)
Critical Hgb of 6.5 called by lab - Dr. Thomasene Lot and primary nurse notified.

## 2015-10-29 NOTE — Consult Note (Signed)
[facility] Consult: 12:58 PM 10/29/2015  LOS: 0 days    Referring Provider: Dr Blaine Hamper  Primary Care Physician:  No PCP Per Patient Primary Gastroenterologist:  unassigned    Reason for Consultation:     HPI: James Mullins is a 55 y.o. male.  Obese. OSA.  CAD, STEMI 09/2015.  On 81 ASA and Brilinta post cath/RCA stent placement 09/12/2015.  Moderate to severe LV dysfunction "Out of proportion to CAD", EF 40 to 45%. Grade 2 diastolic dysfunction.  Mivcrocytic anemia noted during that admission, Hgb 9.2, MCV 67 on 09/12/15, 7.8 on 2/13, 9.0 on 2/20 with pesistently low MCV.  Iron 23, TIBC 482, Sat 5%, Ferritin 4.  Was never transfused but discharged with po Iron Rx.  Did not have stool testing or referral to a GI doc.  Glucose intolerance.  Pt had returned to work, doing well.  Last week he flew to [place] and drove back to [place] transporting a car.    Since starting po iron stools are dark, formed and less regular.  3 days ago developed anorexia and then weakness.  Yesterday has dizziness, presyncope.  This AM had hematemesis (once) and looser, black and maroon stool (3 so far).  No heartburn or abdominal pain.  Came to ED. BP 90s/50s-60s.  Pulse 60s to 70s.   Hgb is 6.5 but MCV now 82  1 unit ordered.  coags normal.   Pt's mom died at  89 with metastatic colon cancer, post surgery and chemo.  Dx made ~ age 59.  Pt's weight is stable. Appetite generally great.  Drinks 12 pack beer q 7 days.  No NSAIDs except the 81 ASA.  No previous issues with excessive bleeding.  Last week had URI with sinus congestion, cough, fever.  Had previous, up to date flu shot.     Past Medical History  Diagnosis Date  . Obesity (BMI 30.0-34.9)   . Snoring disorder 08/05/2013  . OSA on CPAP   . CAD (coronary artery disease)   . MI  (myocardial infarction) ([place])   . Chronic combined systolic and diastolic CHF (congestive heart failure) [facility])     Past Surgical History  Procedure Laterality Date  . None    . Cardiac catheterization N/A 09/12/2015    Procedure: Left Heart Cath and Coronary Angiography;  Surgeon: Peter M Martinique, MD;  Location: [place] [facility];  Service: Cardiovascular;  Laterality: N/A;  . Cardiac catheterization N/A 09/12/2015    Procedure: Coronary Stent Intervention;  Surgeon: Peter M Martinique, MD;  Location: [place] [facility];  Service: Cardiovascular;  Laterality: N/A;    Prior to Admission medications   Medication Sig Start Date End Date Taking? Authorizing Provider  aspirin 81 MG chewable tablet Chew 1 tablet (81 mg total) by mouth daily. 09/14/15  Yes Brittainy Erie Noe, PA-C  atorvastatin (LIPITOR) 80 MG tablet Take 1 tablet (80 mg total) by mouth daily at 6 PM. 09/14/15  Yes Brittainy M Simmons, PA-C  BRILINTA 90 MG TABS tablet Take 90 mg by mouth 2 (two)  times daily.  10/02/15  Yes Historical Provider, MD  carvedilol (COREG) 6.25 MG tablet Take 0.5 tablets (3.125 mg total) by mouth 2 (two) times daily with a meal. 09/21/15  Yes Brittainy Erie Noe, PA-C  ferrous sulfate 325 (65 FE) MG tablet Take 1 tablet (325 mg total) by mouth 2 (two) times daily with a meal. 09/14/15  Yes Brittainy M Simmons, PA-C  lisinopril (PRINIVIL,ZESTRIL) 5 MG tablet Take 1 tablet (5 mg total) by mouth daily. 09/14/15  Yes Brittainy Erie Noe, PA-C  Multiple Vitamins-Minerals (MULTIVITAMIN WITH MINERALS) tablet Take 1 tablet by mouth daily.   Yes Historical Provider, MD  nitroGLYCERIN (NITROSTAT) 0.4 MG SL tablet Place 1 tablet (0.4 mg total) under the tongue every 5 (five) minutes as needed for chest pain (CP or SOB). 09/14/15  Yes Brittainy Erie Noe, PA-C  pantoprazole (PROTONIX) 40 MG tablet Take 1 tablet (40 mg total) by mouth daily at 12 noon. 09/14/15  Yes Brittainy Erie Noe, PA-C  Pseudoeph-CPM-DM-APAP (TYLENOL COLD PO)  Take 2 tablets by mouth 3 (three) times daily as needed (cold symptoms).   Yes Historical Provider, MD    Scheduled Meds: . atorvastatin  80 mg Oral q1800  . carvedilol  3.125 mg Oral BID WC  . dextromethorphan-guaiFENesin  1 tablet Oral BID  . ferrous sulfate  325 mg Oral BID WC  . multivitamin with minerals  1 tablet Oral Daily  . pantoprazole (PROTONIX) IV  40 mg Intravenous Q12H  . sodium chloride flush  3 mL Intravenous Q12H  . sucralfate  1 g Oral TID WC & HS   Infusions: . sodium chloride    . sodium chloride     PRN Meds: acetaminophen **OR** acetaminophen, nitroGLYCERIN, ondansetron **OR** ondansetron (ZOFRAN) IV   Allergies as of 10/29/2015  . (No Known Allergies)    Family History  Problem Relation Age of Onset  . Stroke Father     Social History   Social History  . Marital Status: Married    Spouse Name: renee  . Number of Children: 1  . Years of Education: college   Occupational History  . truck driver    Social History Main Topics  . Smoking status: Never Smoker   . Smokeless tobacco: Current User    Types: Chew  . Alcohol Use: Yes  . Drug Use: No  . Sexual Activity: Not on file   Other Topics Concern  . Not on file   Social History Narrative    REVIEW OF SYSTEMS: Constitutional:  Per HPI.  Generally has few limits to ADLs and job performance,  Not an Barista. ENT:  No nose bleeds Pulm:  No DOE.  Cough per HPI CV:  No palpitations, no LE edema. No chest pain.  GU:  No hematuria, no frequency GI:  Per HPI.  No dysphagia Heme:  Per HPI   Transfusions:  None ever Neuro:  No headaches, no peripheral tingling or numbness Derm:  No itching, no rash or sores.  Endocrine:  No sweats or chills.  No polyuria or dysuria Immunization: current on flu shot Travel:  All over [place].    PHYSICAL EXAM: Vital signs in last 24 hours: Filed Vitals:   10/29/15 1215 10/29/15 1216  BP: 106/69   Pulse: 79   Temp:  97.9 F (36.6 C)  Resp: 12    Wt  Readings from Last 3 Encounters:  09/21/15 125.646 kg (277 lb)  09/12/15 124 kg (273 lb 5.9 oz)  08/22/13 123.832 kg (273  lb)    General: obese, pleasant, comfortable.  Not ill looking Head:  No asymmetry or swelling  Eyes:  No icterus.  Conjunctiva a bit pale Ears:  Not HOH  Nose:  Some sniffling and congestion.  Mouth:  Clear, moist, good teeth.  No blood Neck:  Thick, no mass, no JVD or TMG Lungs:  Clear bil.  Slight, dry cough.  No dyspnea Heart: RRR.  No mrg.  S1/S2 present Abdomen:  Soft, obese, active BS.  No mass, no HSM.  No bruits, no hernias.   Rectal: deferred, dark/maroon stool seen in the commode: looks/smells melenic   Musc/Skeltl: no joint erythema, swelling or deformities Extremities:  No CCE.    Neurologic:  Oriented x 3.  No tremor, no limb weakness.  Skin:  No rash or sores.  No telangectasias  Intake/Output from previous day:   Intake/Output this shift:    LAB RESULTS:  Recent Labs  10/29/15 1010  WBC 8.7  HGB 6.5*  HCT 20.2*  PLT 240   BMET Lab Results  Component Value Date   NA 138 10/29/2015   NA 141 09/14/2015   NA 140 09/13/2015   K 4.8 10/29/2015   K 3.9 09/14/2015   K 4.1 09/13/2015   CL 112* 10/29/2015   CL 108 09/14/2015   CL 108 09/13/2015   CO2 21* 10/29/2015   CO2 21* 09/14/2015   CO2 22 09/13/2015   GLUCOSE 136* 10/29/2015   GLUCOSE 102* 09/14/2015   GLUCOSE 111* 09/13/2015   BUN 34* 10/29/2015   BUN 10 09/14/2015   BUN 11 09/13/2015   CREATININE 0.77 10/29/2015   CREATININE 0.85 09/14/2015   CREATININE 0.95 09/13/2015   CALCIUM 7.7* 10/29/2015   CALCIUM 8.5* 09/14/2015   CALCIUM 8.3* 09/13/2015   LFT  Recent Labs  10/29/15 1010  PROT 5.0*  ALBUMIN 2.8*  AST 19  ALT 17  ALKPHOS 45  BILITOT 0.2*   PT/INR Lab Results  Component Value Date   INR 1.10 10/29/2015   INR 1.07 09/12/2015   Hepatitis Panel No results for input(s): HEPBSAG, HCVAB, HEPAIGM, HEPBIGM in the last 72 hours. C-Diff No components  found for: CDIFF Lipase  No results found for: LIPASE  Drugs of Abuse  No results found for: LABOPIA, COCAINSCRNUR, LABBENZ, AMPHETMU, THCU, LABBARB   RADIOLOGY STUDIES: Dg Chest 2 View  10/29/2015  CLINICAL DATA:  Chest pain, shortness of breath. EXAM: CHEST  2 VIEW COMPARISON:  September 12, 2015. FINDINGS: The heart size and mediastinal contours are within normal limits. Both lungs are clear. No pneumothorax or pleural effusion is noted. The visualized skeletal structures are unremarkable. IMPRESSION: No active cardiopulmonary disease. Electronically Signed   By: Marijo Conception, M.D.   On: 10/29/2015 10:07    ENDOSCOPIC STUDIES: None.  IMPRESSION:   *  Upper GI bleed.  In setting of ASA/Brilinta.  Rule out ulcer.   *   Acute blood loss on top of microcytic, iron deficiency anemia. Hgb 09/21/15 at 1 week post hospital discharge was improved.  Microcytosis has resolved as of today's labs.   *  Maternal hx of death from colon cancer age 68, pt has never had colonoscopy  *  CAD.  MI with RCA DES placement 09/12/15.  On Brilinta, ASA since.     PLAN:     *  Stopped Carafate, po iron (the dark stool it causes confuses issue of GI bleeding or not), continue BID IV PPI  The loose stools are  due to GI bleed.  Do not suspect infection.  Therefore d/c'd contact precautions and stool study orders.   *  EGD this afternoon.  D/w pt and his wife.  He is agreeable.    Azucena Freed  10/29/2015, 12:58 PM Pager: [phone]      Attending physician's note   I have taken a history, examined the patient and reviewed the chart. I agree with the Advanced Practitioner's note, impression and recommendations. 48 yr M with h/o chronic alcohol use, s/p recent MI and RCA stent placement 09/12/2015 on ASA 81 and Brilinta presented with melena. Given recent MI and stent placement he is at high risk for in stent thrombosis to hold anti platelet agents. Continue ASA and Brilinta. Start PPI gtt. Transfuse 2U  pRBC stat. Will plan for urgent EGD this afternoon. The risks and benefits as well as alternatives of endoscopic procedure(s) have been discussed and reviewed. All questions answered. The patient agrees to proceed.  Damaris Hippo, MD [phone] Mon-Fri 8a-5p [phone] after 5p, weekends, holidays

## 2015-10-29 NOTE — Anesthesia Postprocedure Evaluation (Signed)
Anesthesia Post Note  Patient: Clint Gronemeyer Cieslak  Procedure(s) Performed: Procedure(s) (LRB): ESOPHAGOGASTRODUODENOSCOPY (EGD) (N/A)  Patient location during evaluation: PACU Anesthesia Type: General Level of consciousness: awake and alert Pain management: pain level controlled Vital Signs Assessment: post-procedure vital signs reviewed and stable Respiratory status: spontaneous breathing, nonlabored ventilation, respiratory function stable and patient connected to nasal cannula oxygen Cardiovascular status: blood pressure returned to baseline and stable Postop Assessment: no signs of nausea or vomiting Anesthetic complications: no    Last Vitals:  Filed Vitals:   10/29/15 1524 10/29/15 1627  BP: 105/58 131/57  Pulse: 86   Temp: 37.3 C 36.7 C  Resp: 20 20    Last Pain:  Filed Vitals:   10/29/15 1629  PainSc: 0-No pain                 Kayven Aldaco L

## 2015-10-29 NOTE — H&P (Signed)
[facility] History and Physical  Jmarcus Bloemker Scovill [id number] DOB: 1960-09-22 DOA: 10/29/2015  Referring physician: ED physician PCP: No PCP Per Patient  Specialists:   Chief Complaint: nausea, vomiting, diarrhea, hematemesis and cough  HPI: James Mullins is a 55 y.o. male with PMH of CAD, recent STEMI, s/p of DES on 07/11/16 (currently on aspirin and Brilinta), hyperlipidemia, OSA on CPAP, obesity, combined systolic and diastolic congestive heart failure with EF of 40-45 percent, GI bleeding, who presents with nausea, vomiting, diarrhea, hematemesis and cough.  Patient reports that he started having nausea, vomiting, diarrhea at about 7 PM. He has mild abdominal discomfort. He vomited once with bright red blood and some blood clots, described as "good amount of blood". Patient does not have chest pain or dizziness. He has shortness of breath. He also has mild cough with clear mucus production and nasal congestion. Patient denies symptoms of UTI, unilateral weakness, hematuria. ENT no hearing loss.    Of note, patient was recently hospitalized from 2/11 to 09/14/15 because of STEMI. He is s/p of drug-eluting stent placement and has been on ASA and Brilina. Per previous discharge summary, pt had  occasional red blood mixed with stool that he assumed to be hemorrhoids. He was started on iron supplementation. Plan was given referral to GI, but has not been seen yet. He never had EGD or colonoscopy. Patient reports that his mother died of colon cancer at young age.  In ED, patient was found to have hemoglobin dropped from 9.0 on 09/21/15-->6.5. FOBT pending, No tachycardia. Blood pressure soft, electrolytes and renal function okay, negative chest x-ray, negative troponin, INR 1.00. Patient is admitted to inpatient for further evaluation and treatment. GI was consulted by EDP.  EKG: Independently reviewed.QTC 454, T-wave inversion in lead III/aVF, which existed in previous EKG on  09/13/15..  Where does patient live?   At home Can patient participate in ADLs?  Yes  Review of Systems:   General: no fevers, chills, no changes in body weight, has poor appetite, has fatigue HEENT: no blurry vision, hearing changes or sore throat Pulm: has dyspnea, coughing, no wheezing CV: no chest pain, no palpitations Abd: has nausea, vomiting, abdominal discomfort, diarrhea, no constipation GU: no dysuria, burning on urination, increased urinary frequency, hematuria  Ext: no leg edema Neuro: no unilateral weakness, numbness, or tingling, no vision change or hearing loss Skin: no rash MSK: No muscle spasm, no deformity, no limitation of range of movement in spin Heme: No easy bruising.  Travel history: No recent long distant travel.  Allergy: No Known Allergies  Past Medical History  Diagnosis Date  . Obesity (BMI 30.0-34.9)   . Snoring disorder 08/05/2013  . OSA on CPAP   . CAD (coronary artery disease)   . MI (myocardial infarction) ([place])   . Chronic combined systolic and diastolic CHF (congestive heart failure) [facility])     Past Surgical History  Procedure Laterality Date  . None    . Cardiac catheterization N/A 09/12/2015    Procedure: Left Heart Cath and Coronary Angiography;  Surgeon: Peter M Martinique, MD;  Location: [place] [facility];  Service: Cardiovascular;  Laterality: N/A;  . Cardiac catheterization N/A 09/12/2015    Procedure: Coronary Stent Intervention;  Surgeon: Peter M Martinique, MD;  Location: [place] [facility];  Service: Cardiovascular;  Laterality: N/A;    Social History:  reports that he has never smoked. His smokeless tobacco use includes Chew. He reports that he drinks alcohol. He reports that  he does not use illicit drugs.  Family History:  Family History  Problem Relation Age of Onset  . Stroke Father   . Colon cancer Mother      Prior to Admission medications   Medication Sig Start Date End Date Taking? Authorizing Provider  aspirin 81 MG  chewable tablet Chew 1 tablet (81 mg total) by mouth daily. 09/14/15  Yes Brittainy Erie Noe, PA-C  atorvastatin (LIPITOR) 80 MG tablet Take 1 tablet (80 mg total) by mouth daily at 6 PM. 09/14/15  Yes Brittainy M Simmons, PA-C  BRILINTA 90 MG TABS tablet Take 90 mg by mouth 2 (two) times daily.  10/02/15  Yes Historical Provider, MD  carvedilol (COREG) 6.25 MG tablet Take 0.5 tablets (3.125 mg total) by mouth 2 (two) times daily with a meal. 09/21/15  Yes Brittainy Erie Noe, PA-C  ferrous sulfate 325 (65 FE) MG tablet Take 1 tablet (325 mg total) by mouth 2 (two) times daily with a meal. 09/14/15  Yes Brittainy M Simmons, PA-C  lisinopril (PRINIVIL,ZESTRIL) 5 MG tablet Take 1 tablet (5 mg total) by mouth daily. 09/14/15  Yes Brittainy Erie Noe, PA-C  Multiple Vitamins-Minerals (MULTIVITAMIN WITH MINERALS) tablet Take 1 tablet by mouth daily.   Yes Historical Provider, MD  nitroGLYCERIN (NITROSTAT) 0.4 MG SL tablet Place 1 tablet (0.4 mg total) under the tongue every 5 (five) minutes as needed for chest pain (CP or SOB). 09/14/15  Yes Brittainy Erie Noe, PA-C  pantoprazole (PROTONIX) 40 MG tablet Take 1 tablet (40 mg total) by mouth daily at 12 noon. 09/14/15  Yes Brittainy Erie Noe, PA-C  Pseudoeph-CPM-DM-APAP (TYLENOL COLD PO) Take 2 tablets by mouth 3 (three) times daily as needed (cold symptoms).   Yes Historical Provider, MD    Physical Exam: Filed Vitals:   10/29/15 1001 10/29/15 1125 10/29/15 1215 10/29/15 1216  BP:  99/61 106/69   Pulse: 73 77 79   Temp:    97.9 F (36.6 C)  TempSrc:    Oral  Resp: 19 16 12    SpO2: 98% 100% 100%    General: Not in acute distress. Pale looking. HEENT:       Eyes: PERRL, EOMI, no scleral icterus.       ENT: No discharge from the ears and nose, no pharynx injection, no tonsillar enlargement.        Neck: No JVD, no bruit, no mass felt. Heme: No neck lymph node enlargement. Cardiac: S1/S2, RRR, No murmurs, No gallops or rubs. Pulm:  No rales, wheezing,  rhonchi or rubs. Abd: Soft, nondistended, nontender, no rebound pain, no organomegaly, BS present. Ext: No pitting leg edema bilaterally. 2+DP/PT pulse bilaterally. Musculoskeletal: No joint deformities, No joint redness or warmth, no limitation of ROM in spin. Skin: No rashes.  Neuro: Alert, oriented X3, cranial nerves II-XII grossly intact, moves all extremities normally. Psych: Patient is not psychotic, no suicidal or hemocidal ideation.  Labs on Admission:  Basic Metabolic Panel:  Recent Labs Lab 10/29/15 1010  NA 138  K 4.8  CL 112*  CO2 21*  GLUCOSE 136*  BUN 34*  CREATININE 0.77  CALCIUM 7.7*   Liver Function Tests:  Recent Labs Lab 10/29/15 1010  AST 19  ALT 17  ALKPHOS 45  BILITOT 0.2*  PROT 5.0*  ALBUMIN 2.8*   No results for input(s): LIPASE, AMYLASE in the last 168 hours. No results for input(s): AMMONIA in the last 168 hours. CBC:  Recent Labs Lab 10/29/15 1010  WBC 8.7  NEUTROABS 6.7  HGB 6.5*  HCT 20.2*  MCV 82.1  PLT 240   Cardiac Enzymes: No results for input(s): CKTOTAL, CKMB, CKMBINDEX, TROPONINI in the last 168 hours.  BNP (last 3 results) No results for input(s): BNP in the last 8760 hours.  ProBNP (last 3 results) No results for input(s): PROBNP in the last 8760 hours.  CBG: No results for input(s): GLUCAP in the last 168 hours.  Radiological Exams on Admission: Dg Chest 2 View  10/29/2015  CLINICAL DATA:  Chest pain, shortness of breath. EXAM: CHEST  2 VIEW COMPARISON:  September 12, 2015. FINDINGS: The heart size and mediastinal contours are within normal limits. Both lungs are clear. No pneumothorax or pleural effusion is noted. The visualized skeletal structures are unremarkable. IMPRESSION: No active cardiopulmonary disease. Electronically Signed   By: Marijo Conception, M.D.   On: 10/29/2015 10:07    Assessment/Plan Principal Problem:   GI bleed Active Problems:   Obesity (BMI 30.0-34.9)   ST elevation (STEMI) myocardial  infarction involving right coronary artery (HCC)   OSA (obstructive sleep apnea)   Anemia   Chronic combined systolic and diastolic CHF (congestive heart failure) (HCC)   Cough   GI bleed: pt is on ASA and brilinta for recent MI and DES placement. I spoke with Card, Dr. Lyla Son who agreed to hold ASA and brinlita until GI give permission to restart. He recommended to restart ASA and plavix in stead of brinlinta when GI is OK. hgb dropped from 9.0-->6.5. Hemodynamically stable. Patient has shortness of breath, no chest pain.  - will admit to tele bed - transfuse 1 U of blood - GI consulted by Ed, will follow up recommendations - NPO for possible EGD - IVF: 1L NS in ED, followed by 50 mL/hr (pt has EF 40-45%) - Start IV pantoprazole 40 mg bib and carafate - Zofran IV for nausea - Avoid NSAIDs and SQ heparin - Maintain IV access (2 large bore IVs if possible). - Monitor closely and follow q6h cbc, transfuse as necessary. - LaB: INR, PTT and type screen  Nausea, vomiting, diarrhea: -Follow-up c diff PCR and GI path panel -Zofran for nausea  OSA: -CPAP  CAD and MI: s/p DES on 09/12/15. No CP now. -hold ASA and brilinta -continue Coreg and Lipitor -Nitroglycerin when necessary  OSA: -CPAP  Chronic combined systolic and diastolic CHF (congestive heart failure) ([place]): 2d echo on 09/14/15 showed EF of 40-45% with grade 2 diastolic dysfunction. Patient does not have leg edema. Patient is not on diuretics. He says have his compensated. -Hold her lisinopril due to softer blood pressure in the GI bleeding -Check BNP  Cough and nasal congestion: Chest x-ray is negative. no fever, chills or chest pain. Likely URI viral infection. No indication for Abs.  -flu PCR  -Mucinex for cough   DVT ppx: SCD  Code Status: Full code Family Communication:  Yes, patient's wife at bed side Disposition Plan: Admit to inpatient   Date of Service 10/29/2015    Ivor Costa [facility] Pager  [phone]  If 7PM-7AM, please contact night-coverage [email or web address] Password [email or web address] 10/29/2015, 1:06 PM

## 2015-10-29 NOTE — ED Notes (Signed)
Will do ekg when pt returns from x-ray. 

## 2015-10-29 NOTE — ED Notes (Signed)
Pt reminded of need for urine specimen 

## 2015-10-29 NOTE — ED Notes (Signed)
Pt from home. Reports n/v/d since 0700. EMS gave 4mg  zofran and started a liter NS bolus prior to arrival.

## 2015-10-29 NOTE — Op Note (Signed)
[facility] Patient Name: James Mullins Procedure Date: 10/29/2015 MRN: [id number] Attending MD: Mauri Pole , MD Date of Birth: [birth year] CSN:  Age: 55 Admit Type: Inpatient Procedure:                Upper GI endoscopy Indications:              Melena Providers:                Mauri Pole, MD, Sarah Monday, RN, Elspeth Cho, Technician Referring MD:              Medicines:                Monitored Anesthesia Care Complications:            No immediate complications. Estimated blood loss:                            Minimal. Estimated Blood Loss:     Estimated blood loss was minimal. Procedure:                Pre-Anesthesia Assessment:                           - Prior to the procedure, a History and Physical                            was performed, and patient medications and                            allergies were reviewed. The patient's tolerance of                            previous anesthesia was also reviewed. The risks                            and benefits of the procedure and the sedation                            options and risks were discussed with the patient.                            All questions were answered, and informed consent                            was obtained. Anticoagulants: The patient has taken                            aspirin and Brilinta. It was decided not to                            withhold these medications prior to procedure. ASA                            Grade Assessment:  III - A patient with severe                            systemic disease. After reviewing the risks and                            benefits, the patient was deemed in satisfactory                            condition to undergo the procedure.                           After obtaining informed consent, the endoscope was                            passed under direct vision. Throughout the             procedure, the patient's blood pressure, pulse, and                            oxygen saturations were monitored continuously. The                            [id number]) scope was introduced through the                            mouth, and advanced to the second part of duodenum.                            The upper GI endoscopy was accomplished without                            difficulty. The patient tolerated the procedure                            well. Scope In: Scope Out: Findings:      The esophagus was normal.      The examined duodenum was normal.      Multiple large gastric nodules appears to be sub epithelial in the       stomach extending from GE junction to 10-12 cm with multiple ulcerations       and mild oozing of blood. ?small antral pre pyloric varices, 3 columns.       Biopsies were taken Impression:               Multiple large gastric nodules with multiple ulcers                            differential includes GIST vs gastric varices                           specimens collected. Moderate Sedation:      N/A- Per Anesthesia Care Recommendation:           - Await pathology results                           -  CT chest, abd & pelvis with contrast                           -Monitori Hgb q8h and transfuse as needed to                            mainatin Hgb >8                           -PPI gtt                           -NPO                           -Antiplatelet therapy as per cardiology given                            recent STEMI and stent placement Procedure Code(s):        --- Professional ---                           [phone], Esophagogastroduodenoscopy, flexible,                            transoral; diagnostic, including collection of                            specimen(s) by brushing or washing, when performed                            (separate procedure) Diagnosis Code(s):        --- Professional ---                           K92.1,  Melena (includes Hematochezia) CPT copyright 2016 American Medical Association. All rights reserved. The codes documented in this report are preliminary and upon coder review may  be revised to meet current compliance requirements. Mauri Pole, MD 10/29/2015 4:39:42 PM This report has been signed electronically. Number of Addenda: 0

## 2015-10-29 NOTE — Anesthesia Preprocedure Evaluation (Addendum)
Anesthesia Evaluation  Patient identified by MRN, date of birth, ID band Patient awake    Reviewed: Allergy & Precautions, H&P , NPO status , Patient's Chart, lab work & pertinent test results, reviewed documented beta blocker date and time   Airway Mallampati: III  TM Distance: >3 FB Neck ROM: full    Dental no notable dental hx. (+) Dental Advisory Given, Teeth Intact   Pulmonary sleep apnea and Continuous Positive Airway Pressure Ventilation ,    Pulmonary exam normal breath sounds clear to auscultation       Cardiovascular + CAD, + Past MI and +CHF  Normal cardiovascular exam Rhythm:regular Rate:Normal  Combined systolic and diastolic CHF   Neuro/Psych negative neurological ROS  negative psych ROS   GI/Hepatic negative GI ROS, Neg liver ROS,   Endo/Other  negative endocrine ROSMorbid obesity  Renal/GU negative Renal ROS  negative genitourinary   Musculoskeletal   Abdominal (+) + obese,   Peds  Hematology  (+) anemia , hgb 6.5   Anesthesia Other Findings   Reproductive/Obstetrics negative OB ROS                           Anesthesia Physical Anesthesia Plan  ASA: IV and emergent  Anesthesia Plan: General   Post-op Pain Management:    Induction: Intravenous  Airway Management Planned: Oral ETT  Additional Equipment:   Intra-op Plan:   Post-operative Plan: Extubation in OR  Informed Consent: I have reviewed the patients History and Physical, chart, labs and discussed the procedure including the risks, benefits and alternatives for the proposed anesthesia with the patient or authorized representative who has indicated his/her understanding and acceptance.   Dental Advisory Given  Plan Discussed with: CRNA and Surgeon  Anesthesia Plan Comments:        Anesthesia Quick Evaluation

## 2015-10-29 NOTE — ED Provider Notes (Signed)
CSN: [id number]     Arrival date & time 10/29/15  0901 History   First MD Initiated Contact with Patient 10/29/15 [phone]     Chief Complaint  Patient presents with  . Emesis  . Diarrhea     (Consider location/radiation/quality/duration/timing/severity/associated sxs/prior Treatment) HPI   Patient is a 55 year old male with past medical history significant for STEMI, obesity, OSA. Patient is presenting today with several days of cough congestion shortness of breath and then today had an episode of diarrhea and 2 episodes of vomiting. One episode of vomiting had a small amount of bright red blood in it. This is after the prior episode of vomiting and multiple episodes of dry heaving.  Patient also concerned about his shortness of breath and cough recently. Patient's wife concerned about bleeding.  Patient on novel antiplatelet.  Past Medical History  Diagnosis Date  . Obesity (BMI 30.0-34.9)   . Snoring disorder 08/05/2013  . OSA on CPAP   . CAD (coronary artery disease)   . MI (myocardial infarction) [facility])    Past Surgical History  Procedure Laterality Date  . None    . Cardiac catheterization N/A 09/12/2015    Procedure: Left Heart Cath and Coronary Angiography;  Surgeon: Peter M Martinique, MD;  Location: [place] [facility];  Service: Cardiovascular;  Laterality: N/A;  . Cardiac catheterization N/A 09/12/2015    Procedure: Coronary Stent Intervention;  Surgeon: Peter M Martinique, MD;  Location: [place] [facility];  Service: Cardiovascular;  Laterality: N/A;   Family History  Problem Relation Age of Onset  . Stroke Father    Social History  Substance Use Topics  . Smoking status: Never Smoker   . Smokeless tobacco: Current User    Types: Chew  . Alcohol Use: Yes    Review of Systems  Constitutional: Positive for fatigue. Negative for fever and activity change.  HENT: Positive for congestion.   Respiratory: Positive for cough and shortness of breath.   Cardiovascular: Negative  for chest pain.  Gastrointestinal: Positive for nausea, vomiting and diarrhea. Negative for abdominal pain.  Genitourinary: Negative for dysuria.  Psychiatric/Behavioral: Negative for agitation.      Allergies  Review of patient's allergies indicates no known allergies.  Home Medications   Prior to Admission medications   Medication Sig Start Date End Date Taking? Authorizing Provider  aspirin 81 MG chewable tablet Chew 1 tablet (81 mg total) by mouth daily. 09/14/15  Yes Brittainy Erie Noe, PA-C  atorvastatin (LIPITOR) 80 MG tablet Take 1 tablet (80 mg total) by mouth daily at 6 PM. 09/14/15  Yes Brittainy M Simmons, PA-C  BRILINTA 90 MG TABS tablet Take 90 mg by mouth 2 (two) times daily.  10/02/15  Yes Historical Provider, MD  carvedilol (COREG) 6.25 MG tablet Take 0.5 tablets (3.125 mg total) by mouth 2 (two) times daily with a meal. 09/21/15  Yes Brittainy Erie Noe, PA-C  ferrous sulfate 325 (65 FE) MG tablet Take 1 tablet (325 mg total) by mouth 2 (two) times daily with a meal. 09/14/15  Yes Brittainy M Simmons, PA-C  lisinopril (PRINIVIL,ZESTRIL) 5 MG tablet Take 1 tablet (5 mg total) by mouth daily. 09/14/15  Yes Brittainy Erie Noe, PA-C  Multiple Vitamins-Minerals (MULTIVITAMIN WITH MINERALS) tablet Take 1 tablet by mouth daily.   Yes Historical Provider, MD  nitroGLYCERIN (NITROSTAT) 0.4 MG SL tablet Place 1 tablet (0.4 mg total) under the tongue every 5 (five) minutes as needed for chest pain (CP or SOB). 09/14/15  Yes  Brittainy Erie Noe, PA-C  pantoprazole (PROTONIX) 40 MG tablet Take 1 tablet (40 mg total) by mouth daily at 12 noon. 09/14/15  Yes Brittainy Erie Noe, PA-C  Pseudoeph-CPM-DM-APAP (TYLENOL COLD PO) Take 2 tablets by mouth 3 (three) times daily as needed (cold symptoms).   Yes Historical Provider, MD   BP 99/61 mmHg  Pulse 77  Temp(Src) 97.9 F (36.6 C) (Oral)  Resp 16  SpO2 100% Physical Exam  Constitutional: He is oriented to person, place, and time. He appears  well-nourished.  HENT:  Head: Normocephalic.  Mouth/Throat: Oropharynx is clear and moist.  Moist mucous membranes.  Eyes: Conjunctivae are normal.  Neck: No tracheal deviation present.  Cardiovascular: Normal rate.   Normal heart rate.  Pulmonary/Chest: Effort normal. No stridor. No respiratory distress.  Abdominal: Soft. There is no tenderness. There is no guarding.  No tenderness to palpation.  Musculoskeletal: Normal range of motion. He exhibits no edema.  Neurological: He is oriented to person, place, and time. No cranial nerve deficit.  Skin: Skin is warm and dry. No rash noted. He is not diaphoretic.  Psychiatric: He has a normal mood and affect. His behavior is normal.  Nursing note and vitals reviewed.   ED Course  Procedures (including critical care time) Labs Review Labs Reviewed  COMPREHENSIVE METABOLIC PANEL - Abnormal; Notable for the following:    Chloride 112 (*)    CO2 21 (*)    Glucose, Bld 136 (*)    BUN 34 (*)    Calcium 7.7 (*)    Total Protein 5.0 (*)    Albumin 2.8 (*)    Total Bilirubin 0.2 (*)    All other components within normal limits  CBC WITH DIFFERENTIAL/PLATELET - Abnormal; Notable for the following:    RBC 2.46 (*)    Hemoglobin 6.5 (*)    HCT 20.2 (*)    RDW 24.9 (*)    All other components within normal limits  PROTIME-INR  URINALYSIS, ROUTINE W REFLEX MICROSCOPIC (NOT AT [facility])  I-STAT TROPOININ, ED  TYPE AND SCREEN  PREPARE RBC (CROSSMATCH)  ABO/RH    Imaging Review Dg Chest 2 View  10/29/2015  CLINICAL DATA:  Chest pain, shortness of breath. EXAM: CHEST  2 VIEW COMPARISON:  September 12, 2015. FINDINGS: The heart size and mediastinal contours are within normal limits. Both lungs are clear. No pneumothorax or pleural effusion is noted. The visualized skeletal structures are unremarkable. IMPRESSION: No active cardiopulmonary disease. Electronically Signed   By: Marijo Conception, M.D.   On: 10/29/2015 10:07   I have personally  reviewed and evaluated these images and lab results as part of my medical decision-making.   EKG Interpretation   Date/Time:  Thursday October 29 2015 09:59:03 EDT Ventricular Rate:  69 PR Interval:  155 QRS Duration: 100 QT Interval:  424 QTC Calculation: 454 R Axis:   43 Text Interpretation:  Sinus rhythm RSR' in V1 or V2, right VCD or RVH  Borderline T abnormalities, inferior leads change since last tracing in  teh system no evidence of STEMi on the current ekg.  Confirmed by Gerald Leitz ([id number]) on 10/29/2015 10:03:17 AM      MDM   Final diagnoses:  Gastrointestinal hemorrhage, unspecified gastritis, unspecified gastrointestinal hemorrhage type    She is a 55 year old gentleman with past medical history significant for stent on antiplatelet. He is presenting today with several days of cough congestion and shortness of breath. In addition he had 2 episodes of vomiting  one with small amounts of bright red blood. Patient has normal no tachycardia, mildly low BP and normal physical exam.  The patient likely has a flu. Flu this year involves both respiratory and GI symptoms. I think the blood is probably from irritation of the esophagus after multiple episodes of dry heaving. We will give fluids, observe in the emergency department, will get chest x-ray.   12:15 PM Low Hgb.   Concern for GI bleed as source. In the setting of cardiac patietn, Will give 1 unit of blood, consult GI.  No histoyr of varices.   CRITICAL CARE Performed by: Gardiner Sleeper Total critical care time: 30 minutes Critical care time was exclusive of separately billable procedures and treating other patients. Critical care was necessary to treat or prevent imminent or life-threatening deterioration. Critical care was time spent personally by me on the following activities: development of treatment plan with patient and/or surrogate as well as nursing, discussions with consultants, evaluation of  patient's response to treatment, examination of patient, obtaining history from patient or surrogate, ordering and performing treatments and interventions, ordering and review of laboratory studies, ordering and review of radiographic studies, pulse oximetry and re-evaluation of patient's condition.   Khalfani Weideman Julio Alm, MD 10/29/15 1215

## 2015-10-29 NOTE — Anesthesia Procedure Notes (Signed)
Procedure Name: Intubation Date/Time: 10/29/2015 3:56 PM Performed by: Danley Danker L Patient Re-evaluated:Patient Re-evaluated prior to inductionOxygen Delivery Method: Circle system utilized Preoxygenation: Pre-oxygenation with 100% oxygen Intubation Type: IV induction Laryngoscope Size: Miller and 3 Grade View: Grade I Tube type: Oral Number of attempts: 2 Airway Equipment and Method: Stylet Placement Confirmation: ETT inserted through vocal cords under direct vision,  breath sounds checked- equal and bilateral and positive ETCO2 Secured at: 20 cm Tube secured with: Tape Dental Injury: Teeth and Oropharynx as per pre-operative assessment

## 2015-10-29 NOTE — Transfer of Care (Signed)
Immediate Anesthesia Transfer of Care Note  Patient: Blanton Proto Kugel  Procedure(s) Performed: Procedure(s): ESOPHAGOGASTRODUODENOSCOPY (EGD) (N/A)  Patient Location: Endoscopy Unit  Anesthesia Type:General  Level of Consciousness: awake, alert  and oriented  Airway & Oxygen Therapy: Patient Spontanous Breathing and Patient connected to face mask oxygen  Post-op Assessment: Report given to RN and Post -op Vital signs reviewed and stable  Post vital signs: Reviewed and stable  Last Vitals:  Filed Vitals:   10/29/15 1524 10/29/15 1627  BP: 105/58   Pulse: 86   Temp: 37.3 C 36.7 C  Resp: 20     Complications: No apparent anesthesia complications

## 2015-10-29 NOTE — ED Notes (Signed)
Bed: [id number] Expected date:  Expected time:  Means of arrival:  Comments: 55 yo M- N/V/D

## 2015-10-30 ENCOUNTER — Encounter (HOSPITAL_COMMUNITY): Payer: Self-pay | Admitting: Gastroenterology

## 2015-10-30 DIAGNOSIS — D481 Neoplasm of uncertain behavior of connective and other soft tissue: Secondary | ICD-10-CM

## 2015-10-30 DIAGNOSIS — K3189 Other diseases of stomach and duodenum: Secondary | ICD-10-CM | POA: Insufficient documentation

## 2015-10-30 DIAGNOSIS — K2961 Other gastritis with bleeding: Secondary | ICD-10-CM

## 2015-10-30 DIAGNOSIS — D509 Iron deficiency anemia, unspecified: Secondary | ICD-10-CM

## 2015-10-30 DIAGNOSIS — D62 Acute posthemorrhagic anemia: Secondary | ICD-10-CM

## 2015-10-30 DIAGNOSIS — D500 Iron deficiency anemia secondary to blood loss (chronic): Secondary | ICD-10-CM

## 2015-10-30 DIAGNOSIS — I2111 ST elevation (STEMI) myocardial infarction involving right coronary artery: Secondary | ICD-10-CM

## 2015-10-30 DIAGNOSIS — K922 Gastrointestinal hemorrhage, unspecified: Secondary | ICD-10-CM

## 2015-10-30 LAB — CBC
HCT: 20.3 % — ABNORMAL LOW (ref 39.0–52.0)
HEMATOCRIT: 19.6 % — AB (ref 39.0–52.0)
Hemoglobin: 6.8 g/dL — CL (ref 13.0–17.0)
Hemoglobin: 6.4 g/dL — CL (ref 13.0–17.0)
MCH: 27.2 pg (ref 26.0–34.0)
MCH: 28.1 pg (ref 26.0–34.0)
MCHC: 33.5 g/dL (ref 30.0–36.0)
MCHC: 32.7 g/dL (ref 30.0–36.0)
MCV: 83.9 fL (ref 78.0–100.0)
MCV: 83.4 fL (ref 78.0–100.0)
PLATELETS: 164 10*3/uL (ref 150–400)
Platelets: 161 10*3/uL (ref 150–400)
RBC: 2.35 MIL/uL — ABNORMAL LOW (ref 4.22–5.81)
RBC: 2.42 MIL/uL — AB (ref 4.22–5.81)
RDW: 21.5 % — ABNORMAL HIGH (ref 11.5–15.5)
RDW: 21.5 % — AB (ref 11.5–15.5)
WBC: 6.2 10*3/uL (ref 4.0–10.5)
WBC: 4.9 10*3/uL (ref 4.0–10.5)

## 2015-10-30 LAB — GLUCOSE, CAPILLARY: Glucose-Capillary: 96 mg/dL (ref 65–99)

## 2015-10-30 LAB — PREPARE RBC (CROSSMATCH)

## 2015-10-30 MED ORDER — SODIUM CHLORIDE 0.9 % IV SOLN
Freq: Once | INTRAVENOUS | Status: AC
Start: 2015-10-30 — End: 2015-10-30
  Administered 2015-10-30: 16:00:00 via INTRAVENOUS

## 2015-10-30 MED ORDER — TICAGRELOR 90 MG PO TABS
90.0000 mg | ORAL_TABLET | Freq: Two times a day (BID) | ORAL | Status: DC
  Administered 2015-10-30: 90 mg via ORAL
  Filled 2015-10-30 (×2): qty 1

## 2015-10-30 MED ORDER — ASPIRIN 81 MG PO CHEW
81.0000 mg | CHEWABLE_TABLET | Freq: Every day | ORAL | Status: DC
  Administered 2015-10-30 – 2015-11-06 (×8): 81 mg via ORAL
  Filled 2015-10-30 (×8): qty 1

## 2015-10-30 NOTE — Progress Notes (Signed)
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 The patient complained of nausea and vomiting and diarrhea on 10/29/2015. He had some bright red blood and hematemesis. Altogether, he stated he vomited approximately 4-5 times.  Of note, patient was recently hospitalized from 2/11 to 09/14/15 because of STEMI. He is s/p of drug-eluting stent placement and has been on ASA and Brilina. Per previous discharge summary, pt had occasional red blood mixed with stool that he assumed to be hemorrhoids. The patient's hemoglobin was noted to be 6.5 at the time of admission, below his baseline of 9. As result, the patient was admitted for further evaluation.  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 Surgery consulted -Case discussed with GI -10/30/2015--transfuse 2 additional units PRBC (4 units total) -continue Protonix drip  CAD -recent STEMI with RCA DES--09/12/15 -appreciate cardiology consult--> continue ASA 81 mg, hold Brillinta - If it is felt that surgery will be on hold, we could consider adding Plavix 75 mg once a day instead of Brilinta -anticoagulation per GI/cardiology  Ischemic cardiomyopathy/chronic systolic and diastolic CHF -123456 echo EF 40-45 percent, grade 2 DD -Continue carvedilol -daily  weight  Acute blood loss anemia -Transfuse PRBC -Monitor hemodynamically -trend hemoglobin    Family Communication:   Wife updated at beside Disposition Plan:   Home when medically stable       Procedures/Studies: Dg Chest 2 View  10/29/2015  CLINICAL DATA:  Chest pain, shortness of breath. EXAM: CHEST  2 VIEW COMPARISON:  September 12, 2015. FINDINGS: The heart size and mediastinal contours are within normal limits. Both lungs are clear. No pneumothorax or pleural effusion is noted. The visualized skeletal structures are unremarkable. IMPRESSION: No active cardiopulmonary disease. Electronically Signed   By: Marijo Conception, M.D.   On: 10/29/2015 10:07   Ct Chest W Contrast  10/29/2015  CLINICAL DATA:  55 y.o. male with Gastric mass. 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 Patient reports that he started having nausea, vomiting, diarrhea at about 7 PM. He has mild abdominal discomfort. He vomited once with bright red blood and some blood clots, described as "good amount of blood". EXAM: CT CHEST, ABDOMEN, AND PELVIS WITH CONTRAST TECHNIQUE: Multidetector CT imaging of the chest, abdomen and pelvis was performed following the standard protocol during bolus administration of intravenous contrast. CONTRAST:  161mL ISOVUE-300 IOPAMIDOL (ISOVUE-300) INJECTION 61%, 77mL OMNIPAQUE IOHEXOL 300 MG/ML SOLN COMPARISON:  None. FINDINGS: CT CHEST Neck base and axilla: No mass or adenopathy. Visualized thyroid is unremarkable. Mediastinum and hila: Heart normal in size and configuration. Great vessels normal in caliber. No atherosclerotic plaque. No mediastinal or hilar masses or pathologically enlarged lymph nodes. Lungs and pleura: 3.8 mm right lower lobe nodule with an adjacent 2 mm nodule. Otherwise clear.  No  pleural effusion. No pneumothorax. CT ABDOMEN AND PELVIS Liver, spleen, gallbladder, pancreas, adrenal glands:  Normal. Kidneys, ureters, bladder:  Normal. Lymph nodes:  No pathologically enlarged lymph nodes. Ascites:  None. Gastrointestinal: 3.7 cm round smooth mass projects along the posterior wall of the mid stomach. No other stomach mass. Normal small bowel. Normal colon. Normal appendix. MUSCULOSKELETAL Mild degenerative changes along the thoracic spine. No osteoblastic or osteolytic lesions. IMPRESSION: 1. 3.7 cm gastric mass. This has smooth margins. 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 2. No evidence of locally invasive gastric malignancy or of metastatic disease. 3. No acute findings within the chest, abdomen or pelvis. Electronically Signed   By: Lajean Manes M.D.   On: 10/29/2015 20:49   Ct Abdomen Pelvis W Contrast  10/29/2015  CLINICAL DATA:  55 y.o. male with Gastric mass. 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 Patient reports that he started having nausea, vomiting, diarrhea at about 7 PM. He has mild abdominal discomfort. He vomited once with bright red blood and some blood clots, described as "good amount of blood". EXAM: CT CHEST, ABDOMEN, AND PELVIS WITH CONTRAST TECHNIQUE: Multidetector CT imaging of the chest, abdomen and pelvis was performed following the standard protocol during bolus administration of intravenous contrast. CONTRAST:  172mL ISOVUE-300 IOPAMIDOL (ISOVUE-300) INJECTION 61%, 40mL OMNIPAQUE IOHEXOL 300 MG/ML SOLN COMPARISON:  None. FINDINGS: CT CHEST Neck base and axilla: No mass or adenopathy. Visualized thyroid is unremarkable. Mediastinum and hila: Heart normal in size and configuration. Great vessels normal in caliber. No atherosclerotic plaque. No mediastinal or hilar masses or  pathologically enlarged lymph nodes. Lungs and pleura: 3.8 mm right lower lobe nodule with an adjacent 2 mm nodule. Otherwise clear. No pleural effusion. No pneumothorax. CT ABDOMEN AND PELVIS Liver, spleen, gallbladder, pancreas, adrenal glands:  Normal. Kidneys, ureters, bladder:  Normal. Lymph nodes:  No pathologically enlarged lymph nodes. Ascites:  None. Gastrointestinal: 3.7 cm round smooth mass projects along the posterior wall of the mid stomach. No other stomach mass. Normal small bowel. Normal colon. Normal appendix. MUSCULOSKELETAL Mild degenerative changes along the thoracic spine. No osteoblastic or osteolytic lesions. IMPRESSION: 1. 3.7 cm gastric mass. This has smooth margins. 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 2. No evidence of locally invasive gastric malignancy or of metastatic disease. 3. No acute findings within the chest, abdomen or pelvis. Electronically Signed   By: Lajean Manes M.D.   On: 10/29/2015 20:49         Subjective: Patient denies fevers, chills, headache, chest pain, dyspnea, nausea, vomiting, diarrhea, abdominal pain, dysuria, hematuria   Objective: Filed Vitals:   10/30/15 1040 10/30/15 1310 10/30/15 1341 10/30/15 1628  BP: 120/64 116/63 113/58 122/66  Pulse: 73 71 73 66  Temp: 98.8 F (37.1 C) 98.3 F (36.8 C) 98.2 F (36.8 C) 98.7 F (37.1 C)  TempSrc: Oral Oral Oral Oral  Resp: 18 16 16 18   Height:      Weight:      SpO2: 100% 100% 99% 99%    Intake/Output Summary (Last 24 hours) at 10/30/15 1942 Last data filed at 10/30/15 1627  Gross per 24 hour  Intake  677.5 ml  Output      0 ml  Net  677.5 ml   Weight change:  Exam:   General:  Pt is alert, follows commands appropriately, not  in acute distress  HEENT: No icterus, No thrush, No neck mass, [place]/AT  Cardiovascular: RRR, S1/S2, no rubs, no gallops  Respiratory: CTA bilaterally, no wheezing, no crackles, no rhonchi  Abdomen: Soft/+BS, non tender, non distended, no guarding  Extremities:  No edema, No lymphangitis, No petechiae, No rashes, no synovitis  Data Reviewed: Basic Metabolic Panel:  Recent Labs Lab 10/29/15 1010  NA 138  K 4.8  CL 112*  CO2 21*  GLUCOSE 136*  BUN 34*  CREATININE 0.77  CALCIUM 7.7*   Liver Function Tests:  Recent Labs Lab 10/29/15 1010  AST 19  ALT 17  ALKPHOS 45  BILITOT 0.2*  PROT 5.0*  ALBUMIN 2.8*   No results for input(s): LIPASE, AMYLASE in the last 168 hours. No results for input(s): AMMONIA in the last 168 hours. CBC:  Recent Labs Lab 10/29/15 1010 10/30/15 0034 10/30/15 0807  WBC 8.7 6.2 4.9  NEUTROABS 6.7  --   --   HGB 6.5* 6.8* 6.4*  HCT 20.2* 20.3* 19.6*  MCV 82.1 83.9 83.4  PLT 240 164 161   Cardiac Enzymes: No results for input(s): CKTOTAL, CKMB, CKMBINDEX, TROPONINI in the last 168 hours. BNP: Invalid input(s): POCBNP CBG:  Recent Labs Lab 10/30/15 0745  GLUCAP 96    No results found for this or any previous visit (from the past 240 hour(s)).   Scheduled Meds: . aspirin  81 mg Oral Daily  . atorvastatin  80 mg Oral q1800  . carvedilol  3.125 mg Oral BID WC  . dextromethorphan-guaiFENesin  1 tablet Oral BID  . multivitamin with minerals  1 tablet Oral Daily  . sodium chloride flush  3 mL Intravenous Q12H   Continuous Infusions: . sodium chloride 50 mL/hr at 10/30/15 0611  . pantoprozole (PROTONIX) infusion 8 mg/hr (10/30/15 1626)     Dayven Linsley, DO  [facility] Pager [phone]  If 7PM-7AM, please contact night-coverage [email or web address] Password [email or web address] 10/30/2015, 7:42 PM   LOS: 1 day

## 2015-10-30 NOTE — Consult Note (Signed)
Reason for Consult:upper GI bleeding, gastric mass Referring Physician: Dr. Harl Bowie    HPI: James Mullins is a 55 year old male with a history of CAD, STEMI s/p DES 9/98/33, OSA, systolic and diastolic heart failure who presented yesterday with hematemesis and diarrhea for 1 day.  He has noticed increased dyspnea and fatigue over the past few weeks.  Symptoms started suddenly 2 days ago.  1 episode of hematemesis, but persistent dark and tarry stools.  He was found to have a hemoglobin of 6.5 from baseline of 9.  He then underwent a upper endoscopy which revealed multiple large gastric nodules with multiple surrounding ulcers.  We have therefore been asked to evaluate.  CT of abdomen and pelvis revealed a 3.7cm gastric mass, CT of chest was negative for metastatic disease. Since admission, he has received 3 units of pRBCs.  Hemoglobin this AM was 6.4.    He has been placed back on ASA and Brilinta.  Remains NPO.    Past Medical History  Diagnosis Date  . Obesity (BMI 30.0-34.9) 2015  . OSA on CPAP 08/2013    snoring. sleep study confirmed.  Dr Asencion Partridge Dohmeier  . CAD (coronary artery disease) 09/2015  . MI (myocardial infarction) ([facility]) 09/2015  . Chronic combined systolic and diastolic CHF (congestive heart failure) ([facility]) 09/2015    ef 40 to 45%,  grade 2 diastolic dysfunction.   . Iron deficiency anemia 09/2015  . Upper GI bleed 10/29/15    EGD: multiple subepithelial gastric nodules with overlying multiple, oozing ulcers.  GIST vs gastric varices.     Past Surgical History  Procedure Laterality Date  . Cardiac catheterization N/A 09/12/2015    Left Heart Cath and Coronary Angiography; Dr Peter M Martinique, MD;   . Cardiac catheterization N/A 09/12/2015    placement Promus stent to mid RCA, Dr Peter M Martinique, MD  . Esophagogastroduodenoscopy N/A 10/29/2015    Procedure: ESOPHAGOGASTRODUODENOSCOPY (EGD);  Surgeon: Mauri Pole, MD;  Location: [facility];  Service: Endoscopy;   Laterality: N/A;    Family History  Problem Relation Age of Onset  . Stroke Father   . Colon cancer Mother     Social History:  reports that he has never smoked. His smokeless tobacco use includes Chew. He reports that he drinks alcohol. He reports that he does not use illicit drugs.  Allergies: No Known Allergies  Medications:  Scheduled Meds: . sodium chloride   Intravenous Once  . aspirin  81 mg Oral Daily  . atorvastatin  80 mg Oral q1800  . carvedilol  3.125 mg Oral BID WC  . dextromethorphan-guaiFENesin  1 tablet Oral BID  . multivitamin with minerals  1 tablet Oral Daily  . sodium chloride flush  3 mL Intravenous Q12H  . ticagrelor  90 mg Oral BID   Continuous Infusions: . sodium chloride 50 mL/hr at 10/30/15 0611  . pantoprozole (PROTONIX) infusion 8 mg/hr (10/30/15 0611)   PRN Meds:.acetaminophen **OR** acetaminophen, nitroGLYCERIN, ondansetron **OR** ondansetron (ZOFRAN) IV   Results for orders placed or performed during the hospital encounter of 10/29/15 (from the past 48 hour(s))  Comprehensive metabolic panel     Status: Abnormal   Collection Time: 10/29/15 10:10 AM  Result Value Ref Range   Sodium 138 135 - 145 mmol/L   Potassium 4.8 3.5 - 5.1 mmol/L   Chloride 112 (H) 101 - 111 mmol/L   CO2 21 (L) 22 - 32 mmol/L   Glucose, Bld 136 (H) 65 - 99  mg/dL   BUN 34 (H) 6 - 20 mg/dL   Creatinine, Ser 0.77 0.61 - 1.24 mg/dL   Calcium 7.7 (L) 8.9 - 10.3 mg/dL   Total Protein 5.0 (L) 6.5 - 8.1 g/dL   Albumin 2.8 (L) 3.5 - 5.0 g/dL   AST 19 15 - 41 U/L   ALT 17 17 - 63 U/L   Alkaline Phosphatase 45 38 - 126 U/L   Total Bilirubin 0.2 (L) 0.3 - 1.2 mg/dL   GFR calc non Af Amer >60 >60 mL/min   GFR calc Af Amer >60 >60 mL/min    Comment: (NOTE) The eGFR has been calculated using the CKD EPI equation. This calculation has not been validated in all clinical situations. eGFR's persistently <60 mL/min signify possible Chronic Kidney Disease.    Anion gap 5 5 - 15   CBC with Differential/Platelet     Status: Abnormal   Collection Time: 10/29/15 10:10 AM  Result Value Ref Range   WBC 8.7 4.0 - 10.5 K/uL   RBC 2.46 (L) 4.22 - 5.81 MIL/uL   Hemoglobin 6.5 (LL) 13.0 - 17.0 g/dL    Comment: CRITICAL RESULT CALLED TO, READ BACK BY AND VERIFIED WITH: AYERS,E. RN '@1030'$  ON 3.30.17 BY MCCOY,N.    HCT 20.2 (L) 39.0 - 52.0 %   MCV 82.1 78.0 - 100.0 fL   MCH 26.4 26.0 - 34.0 pg   MCHC 32.2 30.0 - 36.0 g/dL   RDW 24.9 (H) 11.5 - 15.5 %   Platelets 240 150 - 400 K/uL   Neutrophils Relative % 77 %   Lymphocytes Relative 17 %   Monocytes Relative 6 %   Eosinophils Relative 0 %   Basophils Relative 0 %   Neutro Abs 6.7 1.7 - 7.7 K/uL   Lymphs Abs 1.5 0.7 - 4.0 K/uL   Monocytes Absolute 0.5 0.1 - 1.0 K/uL   Eosinophils Absolute 0.0 0.0 - 0.7 K/uL   Basophils Absolute 0.0 0.0 - 0.1 K/uL   RBC Morphology ELLIPTOCYTES     Comment: POLYCHROMASIA PRESENT TEARDROP CELLS    WBC Morphology MILD LEFT SHIFT (1-5% METAS, OCC MYELO, OCC BANDS)    Smear Review LARGE PLATELETS PRESENT   Protime-INR     Status: None   Collection Time: 10/29/15 10:10 AM  Result Value Ref Range   Prothrombin Time 14.4 11.6 - 15.2 seconds   INR 1.10 0.00 - 1.49  I-stat troponin, ED     Status: None   Collection Time: 10/29/15 10:15 AM  Result Value Ref Range   Troponin i, poc 0.01 0.00 - 0.08 ng/mL   Comment 3            Comment: Due to the release kinetics of cTnI, a negative result within the first hours of the onset of symptoms does not rule out myocardial infarction with certainty. If myocardial infarction is still suspected, repeat the test at appropriate intervals.   Type and screen [place]     Status: None (Preliminary result)   Collection Time: 10/29/15 11:02 AM  Result Value Ref Range   ABO/RH(D) O POS    Antibody Screen NEG    Sample Expiration 11/01/2015    Unit Number [id number]    Blood Component Type RED CELLS,LR    Unit division 00     Status of Unit ISSUED,FINAL    Transfusion Status OK TO TRANSFUSE    Crossmatch Result Compatible    Unit Number [id number]    Blood Component  Type RED CELLS,LR    Unit division 00    Status of Unit ISSUED,FINAL    Transfusion Status OK TO TRANSFUSE    Crossmatch Result Compatible    Unit Number [id number]    Blood Component Type RED CELLS,LR    Unit division 00    Status of Unit ISSUED    Transfusion Status OK TO TRANSFUSE    Crossmatch Result Compatible    Unit Number [id number]    Blood Component Type RED CELLS,LR    Unit division 00    Status of Unit ALLOCATED    Transfusion Status OK TO TRANSFUSE    Crossmatch Result Compatible   Prepare RBC     Status: None   Collection Time: 10/29/15 11:02 AM  Result Value Ref Range   Order Confirmation ORDER PROCESSED BY BLOOD BANK   ABO/Rh     Status: None   Collection Time: 10/29/15 11:02 AM  Result Value Ref Range   ABO/RH(D) O POS   Urinalysis, Routine w reflex microscopic (not at [facility])     Status: None   Collection Time: 10/29/15  1:38 PM  Result Value Ref Range   Color, Urine YELLOW YELLOW   APPearance CLEAR CLEAR   Specific Gravity, Urine 1.022 1.005 - 1.030   pH 5.0 5.0 - 8.0   Glucose, UA NEGATIVE NEGATIVE mg/dL   Hgb urine dipstick NEGATIVE NEGATIVE   Bilirubin Urine NEGATIVE NEGATIVE   Ketones, ur NEGATIVE NEGATIVE mg/dL   Protein, ur NEGATIVE NEGATIVE mg/dL   Nitrite NEGATIVE NEGATIVE   Leukocytes, UA NEGATIVE NEGATIVE    Comment: MICROSCOPIC NOT DONE ON URINES WITH NEGATIVE PROTEIN, BLOOD, LEUKOCYTES, NITRITE, OR GLUCOSE <1000 mg/dL.  Influenza panel by PCR (type A & B, H1N1)     Status: None   Collection Time: 10/29/15  1:38 PM  Result Value Ref Range   Influenza A By PCR NEGATIVE NEGATIVE   Influenza B By PCR NEGATIVE NEGATIVE   H1N1 flu by pcr NOT DETECTED NOT DETECTED    Comment:        The Xpert Flu assay (FDA approved for nasal aspirates or washes and nasopharyngeal swab specimens),  is intended as an aid in the diagnosis of influenza and should not be used as a sole basis for treatment. Performed at [facility]   Brain natriuretic peptide     Status: None   Collection Time: 10/29/15  1:50 PM  Result Value Ref Range   B Natriuretic Peptide 28.9 0.0 - 100.0 pg/mL  APTT     Status: None   Collection Time: 10/29/15  1:50 PM  Result Value Ref Range   aPTT 27 24 - 37 seconds  Prepare RBC     Status: None   Collection Time: 10/29/15  5:14 PM  Result Value Ref Range   Order Confirmation ORDER PROCESSED BY BLOOD BANK   CBC     Status: Abnormal   Collection Time: 10/30/15 12:34 AM  Result Value Ref Range   WBC 6.2 4.0 - 10.5 K/uL   RBC 2.42 (L) 4.22 - 5.81 MIL/uL   Hemoglobin 6.8 (LL) 13.0 - 17.0 g/dL    Comment: CRITICAL VALUE NOTED.  VALUE IS CONSISTENT WITH PREVIOUSLY REPORTED AND CALLED VALUE. REPEATED TO VERIFY    HCT 20.3 (L) 39.0 - 52.0 %   MCV 83.9 78.0 - 100.0 fL   MCH 28.1 26.0 - 34.0 pg   MCHC 33.5 30.0 - 36.0 g/dL   RDW 21.5 (H) 11.5 - 15.5 %  Platelets 164 150 - 400 K/uL  Glucose, capillary     Status: None   Collection Time: 10/30/15  7:45 AM  Result Value Ref Range   Glucose-Capillary 96 65 - 99 mg/dL   Comment 1 Notify RN    Comment 2 Document in Chart   CBC     Status: Abnormal   Collection Time: 10/30/15  8:07 AM  Result Value Ref Range   WBC 4.9 4.0 - 10.5 K/uL   RBC 2.35 (L) 4.22 - 5.81 MIL/uL   Hemoglobin 6.4 (LL) 13.0 - 17.0 g/dL    Comment: CRITICAL VALUE NOTED.  VALUE IS CONSISTENT WITH PREVIOUSLY REPORTED AND CALLED VALUE.   HCT 19.6 (L) 39.0 - 52.0 %   MCV 83.4 78.0 - 100.0 fL   MCH 27.2 26.0 - 34.0 pg   MCHC 32.7 30.0 - 36.0 g/dL   RDW 82.3 (H) 98.8 - 30.0 %   Platelets 161 150 - 400 K/uL  Prepare RBC     Status: None   Collection Time: 10/30/15 10:00 AM  Result Value Ref Range   Order Confirmation ORDER PROCESSED BY BLOOD BANK     Dg Chest 2 View  10/29/2015  CLINICAL DATA:  Chest pain, shortness of breath.  EXAM: CHEST  2 VIEW COMPARISON:  September 12, 2015. FINDINGS: The heart size and mediastinal contours are within normal limits. Both lungs are clear. No pneumothorax or pleural effusion is noted. The visualized skeletal structures are unremarkable. IMPRESSION: No active cardiopulmonary disease. Electronically Signed   By: Lupita Raider, M.D.   On: 10/29/2015 10:07   Ct Chest W Contrast  10/29/2015  CLINICAL DATA:  55 y.o. male with Gastric mass. 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 Patient reports that he started having nausea, vomiting, diarrhea at about 7 PM. He has mild abdominal discomfort. He vomited once with bright red blood and some blood clots, described as "good amount of blood". EXAM: CT CHEST, ABDOMEN, AND PELVIS WITH CONTRAST TECHNIQUE: Multidetector CT imaging of the chest, abdomen and pelvis was performed following the standard protocol during bolus administration of intravenous contrast. CONTRAST:  ISOVUE-300 IOPAMIDOL (ISOVUE-300) INJECTION 61%, 1mL OMNIPAQUE IOHEXOL 300 MG/ML SOLN COMPARISON:  None. FINDINGS: CT CHEST Neck base and axilla: No mass or adenopathy. Visualized thyroid is unremarkable. Mediastinum and hila: Heart normal in size and configuration. Great vessels normal in caliber. No atherosclerotic plaque. No mediastinal or hilar masses or pathologically enlarged lymph nodes. Lungs and pleura: 3.8 mm right lower lobe nodule with an adjacent 2 mm nodule. Otherwise clear. No pleural effusion. No pneumothorax. CT ABDOMEN AND PELVIS Liver, spleen, gallbladder, pancreas, adrenal glands:  Normal. Kidneys, ureters, bladder:  Normal. Lymph nodes:  No pathologically enlarged lymph nodes. Ascites:  None. Gastrointestinal: 3.7 cm round smooth mass projects along the posterior wall of the mid  stomach. No other stomach mass. Normal small bowel. Normal colon. Normal appendix. MUSCULOSKELETAL Mild degenerative changes along the thoracic spine. No osteoblastic or osteolytic lesions. IMPRESSION: 1. 3.7 cm gastric mass. This has smooth margins. 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 2. No evidence of locally invasive gastric malignancy or of metastatic disease. 3. No acute findings within the chest, abdomen or pelvis. Electronically Signed   By: Amie Portland M.D.   On: 10/29/2015 20:49   Ct Abdomen Pelvis W Contrast  10/29/2015  CLINICAL DATA:  55 y.o. male with Gastric mass. 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 Patient reports that he started having nausea, vomiting, diarrhea at about 7 PM. He has mild abdominal discomfort. He vomited once with bright red blood and some blood clots, described as "good amount of blood". EXAM: CT CHEST, ABDOMEN, AND PELVIS WITH CONTRAST TECHNIQUE: Multidetector CT imaging of the chest, abdomen and pelvis was performed following the standard protocol during bolus administration of intravenous contrast. CONTRAST:  ISOVUE-300 IOPAMIDOL (ISOVUE-300) INJECTION 61%, 13mL OMNIPAQUE IOHEXOL 300 MG/ML SOLN COMPARISON:  None. FINDINGS: CT CHEST Neck base and axilla: No mass or adenopathy. Visualized thyroid is unremarkable. Mediastinum and hila: Heart normal in size and configuration. Great vessels normal in caliber. No atherosclerotic plaque. No mediastinal or hilar masses or pathologically enlarged lymph nodes. Lungs and pleura: 3.8 mm right lower lobe nodule with an adjacent 2 mm nodule. Otherwise clear. No pleural effusion. No pneumothorax. CT ABDOMEN AND PELVIS Liver, spleen, gallbladder, pancreas, adrenal glands:  Normal. Kidneys, ureters, bladder:  Normal. Lymph nodes:  No pathologically  enlarged lymph nodes. Ascites:  None. Gastrointestinal: 3.7 cm round smooth mass projects along the posterior wall of the mid stomach. No other stomach mass. Normal small bowel. Normal colon. Normal appendix. MUSCULOSKELETAL Mild degenerative changes along the thoracic spine. No osteoblastic or osteolytic lesions. IMPRESSION: 1. 3.7 cm gastric mass. This has smooth margins. 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 2. No evidence of locally invasive gastric malignancy or of metastatic disease. 3. No acute findings within the chest, abdomen or pelvis. Electronically Signed   By: Amie Portland M.D.   On: 10/29/2015 20:49    Review of Systems  Constitutional: Positive for malaise/fatigue. Negative for fever, chills, weight loss and diaphoresis.  Eyes: Negative for blurred vision, double vision, photophobia, pain, discharge and redness.  Respiratory: Positive for shortness of breath. Negative for cough, hemoptysis, sputum production and wheezing.   Cardiovascular: Negative for chest pain, palpitations, orthopnea, claudication, leg swelling and PND.  Gastrointestinal: Positive for vomiting and diarrhea. Negative for abdominal pain, constipation, blood in stool and melena.  Genitourinary: Negative for dysuria, urgency, frequency, hematuria and flank pain.  Musculoskeletal: Negative for myalgias, back pain, joint pain, falls and neck pain.  Neurological: Negative for dizziness, tingling, tremors, sensory change, speech change, focal weakness, seizures, loss of consciousness and weakness.  Psychiatric/Behavioral: Negative for depression, suicidal ideas, hallucinations, memory loss and substance abuse. The patient is not nervous/anxious and does not have insomnia.    Blood pressure 120/64, pulse 73, temperature 98.8 F (37.1 C), temperature source Oral, resp. rate 18, height 5\' 9"  (1.753 m), weight 122.4 kg (269 lb 13.5 oz), SpO2 100 %. Physical Exam  Constitutional: He is oriented to person, place, and time. He appears  well-developed and well-nourished. No distress.  Cardiovascular: Normal rate, regular rhythm and intact distal pulses.  Exam reveals no gallop and no friction rub.   No murmur heard. Respiratory: Effort normal and breath sounds normal. No respiratory distress. He has no wheezes. He has no rales. He exhibits no tenderness.  GI: Soft. Bowel sounds are normal. He exhibits no distension and no mass. There is no tenderness. There is no rebound and no guarding.  Musculoskeletal: Normal range of motion. He exhibits no edema or tenderness.  Neurological: He is alert and oriented to person, place,  and time.  Skin: Skin is warm and dry. He is not diaphoretic.  Psychiatric: He has a normal mood and affect. His behavior is normal. Judgment and thought content normal.    Assessment/Plan: ABLA Upper GI bleeding Gastric mass Will await pathology. Remain NPO at this time.  Cardiology evaluation.  Doubtful he will be able to come off anticoagulation given recent stent.  Emergent surgery only if he becomes unstable.  He is hemodynamically stable at present time.  May consider SDU given cardiac history of hgb level.  Receiving additional 2 units of pRBCs.  Will follow along closely and await pathology for final surgical recommendations.  CAD/NSTEMI-stent February 2017.  On Brilinta and ASA    Jalyn Rosero, [facility] ANP-BC Pager [phone] 10/30/2015, 12:46 PM

## 2015-10-30 NOTE — Consult Note (Signed)
CARDIOLOGY CONSULT NOTE   Patient ID: James Mullins MRN: [id number] DOB/AGE: 1961/03/19 55 y.o.  Admit date: 10/29/2015  Primary Physician   No PCP Per Patient Primary Cardiologist   Dr Martinique Reason for Consultation   preop eval  [id number] James Mullins is a 55 y.o. year old male with a history of obesity, OSA on CPAP, chronic ETOH use.   D/c 09/14/2015 after admission for STEMI, s/p DES RCA (single vessel dz), EF 40-45%. Seen in office 02/20 and was doing well. Coreg reduced. He was anemic on admission and H&H 7.8/28 at d/c. +iron deficiency. At f/u office visit, his H&H was 9.0/31.7. Pt on ASA/Brilinta.  Pt admitted 03/30 for N&V, diarrhea, hematemesis and cough. H&H 6.5/20.2 on admission. EGD was performed and showed multiple large gastric nodules with multiple ulcers, differential includes GIST vs gastric varices specimens collected.  Cardiology asked to weigh in on DAPT and preop risk as pt may need surgery. High risk of rebleeding.   James Mullins was doing very well after hospital discharge until he got this illness. No ischemic symptoms, compliant with medications and increasing activity.   He currently feels very weak, but the transfusions are helping. No chest pain or SOB.     Past Medical History  Diagnosis Date  . Obesity (BMI 30.0-34.9) 2015  . OSA on CPAP 08/2013    snoring. sleep study confirmed.  Dr Asencion Partridge Dohmeier  . CAD (coronary artery disease) 09/2015  . MI (myocardial infarction) ([facility]) 09/2015  . Chronic combined systolic and diastolic CHF (congestive heart failure) ([place]) 09/2015    ef 40 to 45%,  grade 2 diastolic dysfunction.   . Iron deficiency anemia 09/2015  . Upper GI bleed 10/29/15    EGD: multiple subepithelial gastric nodules with overlying multiple, oozing ulcers.  GIST vs gastric varices.      Past Surgical History  Procedure Laterality Date  . Cardiac catheterization N/A 09/12/2015    Left Heart Cath and Coronary Angiography; Dr Peter  M Martinique, MD;   . Cardiac catheterization N/A 09/12/2015    placement Promus stent to mid RCA, Dr Peter M Martinique, MD  . Esophagogastroduodenoscopy N/A 10/29/2015    Procedure: ESOPHAGOGASTRODUODENOSCOPY (EGD);  Surgeon: Mauri Pole, MD;  Location: [facility];  Service: Endoscopy;  Laterality: N/A;    No Known Allergies  I have reviewed the patient's current medications . sodium chloride   Intravenous Once  . aspirin  81 mg Oral Daily  . atorvastatin  80 mg Oral q1800  . carvedilol  3.125 mg Oral BID WC  . dextromethorphan-guaiFENesin  1 tablet Oral BID  . multivitamin with minerals  1 tablet Oral Daily  . sodium chloride flush  3 mL Intravenous Q12H  . ticagrelor  90 mg Oral BID   . sodium chloride 50 mL/hr at 10/30/15 0611  . pantoprozole (PROTONIX) infusion 8 mg/hr (10/30/15 [id number])   acetaminophen **OR** acetaminophen, nitroGLYCERIN, ondansetron **OR** ondansetron (ZOFRAN) IV  Prior to Admission medications   Medication Sig Start Date End Date Taking? Authorizing Provider  aspirin 81 MG chewable tablet Chew 1 tablet (81 mg total) by mouth daily. 09/14/15  Yes Brittainy Erie Noe, PA-C  atorvastatin (LIPITOR) 80 MG tablet Take 1 tablet (80 mg total) by mouth daily at 6 PM. 09/14/15  Yes Brittainy M Simmons, PA-C  BRILINTA 90 MG TABS tablet Take 90 mg by mouth 2 (two) times daily.  10/02/15  Yes Historical Provider, MD  carvedilol (COREG) 6.25 MG tablet  Take 0.5 tablets (3.125 mg total) by mouth 2 (two) times daily with a meal. 09/21/15  Yes Brittainy Erie Noe, PA-C  ferrous sulfate 325 (65 FE) MG tablet Take 1 tablet (325 mg total) by mouth 2 (two) times daily with a meal. 09/14/15  Yes Brittainy Erie Noe, PA-C  lisinopril (PRINIVIL,ZESTRIL) 5 MG tablet Take 1 tablet (5 mg total) by mouth daily. 09/14/15  Yes Brittainy Erie Noe, PA-C  Multiple Vitamins-Minerals (MULTIVITAMIN WITH MINERALS) tablet Take 1 tablet by mouth daily.   Yes Historical Provider, MD  nitroGLYCERIN  (NITROSTAT) 0.4 MG SL tablet Place 1 tablet (0.4 mg total) under the tongue every 5 (five) minutes as needed for chest pain (CP or SOB). 09/14/15  Yes Brittainy Erie Noe, PA-C  pantoprazole (PROTONIX) 40 MG tablet Take 1 tablet (40 mg total) by mouth daily at 12 noon. 09/14/15  Yes Brittainy Erie Noe, PA-C  Pseudoeph-CPM-DM-APAP (TYLENOL COLD PO) Take 2 tablets by mouth 3 (three) times daily as needed (cold symptoms).   Yes Historical Provider, MD     Social History   Social History  . Marital Status: Married    Spouse Name: renee  . Number of Children: 1  . Years of Education: college   Occupational History  . 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    Social History Main Topics  . Smoking status: Never Smoker   . Smokeless tobacco: Current User    Types: Chew  . Alcohol Use: Yes  . Drug Use: No  . Sexual Activity: Not on file   Other Topics Concern  . Not on file   Social History Narrative    Family Status  Relation Status Death Age  . Mother Deceased   . Father Alive   . Sister Alive   . Brother Alive    Family History  Problem Relation Age of Onset  . Stroke Father   . Colon cancer Mother      ROS:  Full 14 point review of systems complete and found to be negative unless listed above.  Physical Exam: Blood pressure 120/64, pulse 73, temperature 98.8 F (37.1 C), temperature source Oral, resp. rate 18, height 5\' 9"  (1.753 m), weight 269 lb 13.5 oz (122.4 kg), SpO2 100 %.  General: Well developed, well nourished, male in no acute distress Head: Eyes PERRLA, No xanthomas.   Normocephalic and atraumatic, oropharynx without edema or exudate. Dentition: good Lungs: few rales bases Heart: HRRR S1 S2, no rub/gallop, no murmur. pulses are 2+ all 4 extrem.   Neck: No carotid bruits. No lymphadenopathy.  JVD not elevated. Abdomen: Bowel sounds present, abdomen soft and non-tender without masses or hernias noted. Msk:  No spine or  cva tenderness. No weakness, no joint deformities or effusions. Extremities: No clubbing or cyanosis. No edema.  Neuro: Alert and oriented X 3. No focal deficits noted. Psych:  Good affect, responds appropriately Skin: No rashes or lesions noted.  Labs:   Lab Results  Component Value Date   WBC 4.9 10/30/2015   HGB 6.4* 10/30/2015   HCT 19.6* 10/30/2015   MCV 83.4 10/30/2015   PLT 161 10/30/2015    Recent Labs  10/29/15 1010  INR 1.10    Recent Labs Lab 10/29/15 1010  NA 138  K 4.8  CL 112*  CO2 21*  BUN 34*  CREATININE 0.77  CALCIUM 7.7*  PROT 5.0*  BILITOT 0.2*  ALKPHOS 45  ALT 17  AST 19  GLUCOSE 136*  ALBUMIN 2.8*    Recent Labs  10/29/15 1015  TROPIPOC 0.01   B NATRIURETIC PEPTIDE  Date/Time Value Ref Range Status  10/29/2015 01:50 PM 28.9 0.0 - 100.0 pg/mL Final   VITAMIN B-12  Date/Time Value Ref Range Status  09/12/2015 02:58 PM 187 180 - 914 pg/mL Final    Comment:    (NOTE) This assay is not validated for testing neonatal or myeloproliferative syndrome specimens for Vitamin B12 levels.    FOLATE  Date/Time Value Ref Range Status  09/12/2015 02:58 PM 45.8 >5.9 ng/mL Final   FERRITIN  Date/Time Value Ref Range Status  09/12/2015 02:58 PM 4* 24 - 336 ng/mL Final   TIBC  Date/Time Value Ref Range Status  09/12/2015 02:58 PM 482* 250 - 450 ug/dL Final   IRON  Date/Time Value Ref Range Status  09/12/2015 02:58 PM 23* 45 - 182 ug/dL Final   RETIC CT PCT  Date/Time Value Ref Range Status  09/12/2015 02:58 PM 1.5 0.4 - 3.1 % Final    ECG:  10/29/2015 SR, T wave inversions inferior leads c/w recent MI  Radiology:  Dg Chest 2 View 10/29/2015  CLINICAL DATA:  Chest pain, shortness of breath. EXAM: CHEST  2 VIEW COMPARISON:  September 12, 2015. FINDINGS: The heart size and mediastinal contours are within normal limits. Both lungs are clear. No pneumothorax or pleural effusion is noted. The visualized skeletal structures are unremarkable.  IMPRESSION: No active cardiopulmonary disease. Electronically Signed   By: Marijo Conception, M.D.   On: 10/29/2015 10:07   Ct Chest W Contrast 10/29/2015  CLINICAL DATA:  55 y.o. male with Gastric mass. 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 Patient reports that he started having nausea, vomiting, diarrhea at about 7 PM. He has mild abdominal discomfort. He vomited once with bright red blood and some blood clots, described as "good amount of blood". EXAM: CT CHEST, ABDOMEN, AND PELVIS WITH CONTRAST TECHNIQUE: Multidetector CT imaging of the chest, abdomen and pelvis was performed following the standard protocol during bolus administration of intravenous contrast. CONTRAST:  147mL ISOVUE-300 IOPAMIDOL (ISOVUE-300) INJECTION 61%, 23mL OMNIPAQUE IOHEXOL 300 MG/ML SOLN COMPARISON:  None. FINDINGS: CT CHEST Neck base and axilla: No mass or adenopathy. Visualized thyroid is unremarkable. Mediastinum and hila: Heart normal in size and configuration. Great vessels normal in caliber. No atherosclerotic plaque. No mediastinal or hilar masses or pathologically enlarged lymph nodes. Lungs and pleura: 3.8 mm right lower lobe nodule with an adjacent 2 mm nodule. Otherwise clear. No pleural effusion. No pneumothorax. CT ABDOMEN AND PELVIS Liver, spleen, gallbladder, pancreas, adrenal glands:  Normal. Kidneys, ureters, bladder:  Normal. Lymph nodes:  No pathologically enlarged lymph nodes. Ascites:  None. Gastrointestinal: 3.7 cm round smooth mass projects along the posterior wall of the mid stomach. No other stomach mass. Normal small bowel. Normal colon. Normal appendix. MUSCULOSKELETAL Mild degenerative changes along the thoracic spine. No osteoblastic or osteolytic lesions. IMPRESSION: 1. 3.7 cm gastric mass. This has smooth margins.  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 2. No evidence of locally invasive gastric malignancy or of metastatic disease. 3. No acute findings within the chest, abdomen or pelvis. Electronically Signed   By: Lajean Manes M.D.   On: 10/29/2015 20:49    ASSESSMENT AND PLAN:   The patient was seen  today by Dr Marlou Porch, the patient evaluated and the data reviewed.  1.  Chronic combined systolic and diastolic CHF (congestive heart failure) (HCC) - weight is down from prev d/c weight and no overload by exam. - continue daily weights, use Lasix prn and with transfusions for overload.  2.   ST elevation (STEMI) myocardial infarction involving right coronary artery ([place]) 09/2015 - with bleeding issues, need to minimize anticoagulation - MD advise if possible to lower bleeding risk by changing Brilinta to Plavix or Effient, or if could d/c ASA - continue BB and statin as BP will allow - ACE was held, restart when BP allows  Otherwise, per IM/GI +/- Surgery Principal Problem:   GI bleed Active Problems:   Obesity (BMI 30.0-34.9)   OSA (obstructive sleep apnea)   Anemia   Cough   Signed: Lenoard Aden 10/30/2015 11:41 AM Beeper [phone]  Personally seen and examined. Agree with above.  55 year old with severe iron deficiency anemia from gastric source, gastric mass and abnormal endoscopy with recent inferior wall ST elevation myocardial infarction involving mid right coronary drug eluding stent placement on 09/12/15.  Antiplatelet use in the setting of recent drug-eluting stent, right coronary STEMI -For now, I will hold Brilinta and continue low-dose 81 mg of aspirin. He is a proximally 1-1/2 months out side of stent placement. Still an high risk for stent thrombosis. If active bleeding continues, will need to stop ASA 81 as well.  If it is felt that surgery will be on hold, we could consider adding Plavix 75 mg once a day instead of Brilinta. I would not load Plavix. Plavix may result in less  bleeding side effects. Very challenging situation. EGD showed active oozing and hemoglobin remains severely low at 6.4. If surgery were to occur in the next few days, we may wish to initiate heparin IV while we are waiting for surgery, allowing Brilinta washout. I would be hesitant at this time to initiate IV heparin because of oozing seen on endoscopy. Reviewed surgery notes as well, currently hemodynamically stable and they're waiting pathology report. Spoke to his wife as well. Understand the risk of stent thrombosis. Can not however use dual antiplatelet therapy in the setting of active bleeding.   Ischemic cardiomyopathy-EF 40-45% with inferior wall motion abnormality  -Currently in need of blood transfusion. Watch for signs of fluid overload which he is not demonstrating currently. BNP was 28.  Will follow along.  Candee Furbish, MD

## 2015-10-30 NOTE — Progress Notes (Signed)
[facility] Progress Note  Subjective:  Still having bloody stools, but today was darker in color, more like old blood.  Hgb still in 6 gram range so receiving 2 more units PRBC's.  Objective:  Vital signs in last 24 hours: Temp:  [97.9 F (36.6 C)-99.1 F (37.3 C)] 98.3 F (36.8 C) (03/31 0800) Pulse Rate:  [68-92] 76 (03/31 0800) Resp:  [12-22] 18 (03/31 0543) BP: (96-131)/(48-69) 110/64 mmHg (03/31 0800) SpO2:  [98 %-100 %] 99 % (03/31 0800) Weight:  [269 lb 13.5 oz (122.4 kg)] 269 lb 13.5 oz (122.4 kg) (03/30 1328) Last BM Date: 10/29/15 General:  Alert, Well-developed, in NAD Heart:  Regular rate and rhythm; no murmurs Pulm:  CTAB.  No W/R/R. Abdomen:  Soft, non-distended.  BS present.  Non-tender. Extremities:  Without edema. Neurologic:  Alert and oriented x 4;  grossly normal neurologically. Psych:  Alert and cooperative. Normal mood and affect.  Intake/Output from previous day: 03/30 0701 - 03/31 0700 In: 755 [I.V.:405; Blood:350] Out: 675 [Urine:450; Stool:200; Blood:25]  Lab Results:  Recent Labs  10/29/15 1010 10/30/15 0034 10/30/15 0807  WBC 8.7 6.2 4.9  HGB 6.5* 6.8* 6.4*  HCT 20.2* 20.3* 19.6*  PLT 240 164 161   BMET  Recent Labs  10/29/15 1010  NA 138  K 4.8  CL 112*  CO2 21*  GLUCOSE 136*  BUN 34*  CREATININE 0.77  CALCIUM 7.7*   LFT  Recent Labs  10/29/15 1010  PROT 5.0*  ALBUMIN 2.8*  AST 19  ALT 17  ALKPHOS 45  BILITOT 0.2*   PT/INR  Recent Labs  10/29/15 1010  LABPROT 14.4  INR 1.10   Dg Chest 2 View  10/29/2015  CLINICAL DATA:  Chest pain, shortness of breath. EXAM: CHEST  2 VIEW COMPARISON:  September 12, 2015. FINDINGS: The heart size and mediastinal contours are within normal limits. Both lungs are clear. No pneumothorax or pleural effusion is noted. The visualized skeletal structures are unremarkable. IMPRESSION: No active cardiopulmonary disease. Electronically Signed   By: Marijo Conception, M.D.    On: 10/29/2015 10:07   Ct Chest W Contrast  10/29/2015  CLINICAL DATA:  55 y.o. male with Gastric mass. 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 Patient reports that he started having nausea, vomiting, diarrhea at about 7 PM. He has mild abdominal discomfort. He vomited once with bright red blood and some blood clots, described as "good amount of blood". EXAM: CT CHEST, ABDOMEN, AND PELVIS WITH CONTRAST TECHNIQUE: Multidetector CT imaging of the chest, abdomen and pelvis was performed following the standard protocol during bolus administration of intravenous contrast. CONTRAST:  192mL ISOVUE-300 IOPAMIDOL (ISOVUE-300) INJECTION 61%, 92mL OMNIPAQUE IOHEXOL 300 MG/ML SOLN COMPARISON:  None. FINDINGS: CT CHEST Neck base and axilla: No mass or adenopathy. Visualized thyroid is unremarkable. Mediastinum and hila: Heart normal in size and configuration. Great vessels normal in caliber. No atherosclerotic plaque. No mediastinal or hilar masses or pathologically enlarged lymph nodes. Lungs and pleura: 3.8 mm right lower lobe nodule with an adjacent 2 mm nodule. Otherwise clear. No pleural effusion. No pneumothorax. CT ABDOMEN AND PELVIS Liver, spleen, gallbladder, pancreas, adrenal glands:  Normal. Kidneys, ureters, bladder:  Normal. Lymph nodes:  No pathologically enlarged lymph nodes. Ascites:  None. Gastrointestinal: 3.7 cm round smooth mass projects along  the posterior wall of the mid stomach. No other stomach mass. Normal small bowel. Normal colon. Normal appendix. MUSCULOSKELETAL Mild degenerative changes along the thoracic spine. No osteoblastic or osteolytic lesions. IMPRESSION: 1. 3.7 cm gastric mass. This has smooth margins. 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 2. No evidence of locally invasive gastric malignancy or of  metastatic disease. 3. No acute findings within the chest, abdomen or pelvis. Electronically Signed   By: Lajean Manes M.D.   On: 10/29/2015 20:49   Ct Abdomen Pelvis W Contrast  10/29/2015  CLINICAL DATA:  55 y.o. male with Gastric mass. 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 Patient reports that he started having nausea, vomiting, diarrhea at about 7 PM. He has mild abdominal discomfort. He vomited once with bright red blood and some blood clots, described as "good amount of blood". EXAM: CT CHEST, ABDOMEN, AND PELVIS WITH CONTRAST TECHNIQUE: Multidetector CT imaging of the chest, abdomen and pelvis was performed following the standard protocol during bolus administration of intravenous contrast. CONTRAST:  186mL ISOVUE-300 IOPAMIDOL (ISOVUE-300) INJECTION 61%, 39mL OMNIPAQUE IOHEXOL 300 MG/ML SOLN COMPARISON:  None. FINDINGS: CT CHEST Neck base and axilla: No mass or adenopathy. Visualized thyroid is unremarkable. Mediastinum and hila: Heart normal in size and configuration. Great vessels normal in caliber. No atherosclerotic plaque. No mediastinal or hilar masses or pathologically enlarged lymph nodes. Lungs and pleura: 3.8 mm right lower lobe nodule with an adjacent 2 mm nodule. Otherwise clear. No pleural effusion. No pneumothorax. CT ABDOMEN AND PELVIS Liver, spleen, gallbladder, pancreas, adrenal glands:  Normal. Kidneys, ureters, bladder:  Normal. Lymph nodes:  No pathologically enlarged lymph nodes. Ascites:  None. Gastrointestinal: 3.7 cm round smooth mass projects along the posterior wall of the mid stomach. No other stomach mass. Normal small bowel. Normal colon. Normal appendix. MUSCULOSKELETAL Mild degenerative changes along the thoracic spine. No osteoblastic or osteolytic lesions. IMPRESSION: 1. 3.7 cm  gastric mass. This has smooth margins. 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 2. No evidence of locally invasive gastric malignancy or of metastatic disease. 3. No acute findings within the chest, abdomen or pelvis. Electronically Signed   By: Lajean Manes M.D.   On: 10/29/2015 20:49   Assessment / Plan: *Upper GI bleed. In setting of ASA/Brilinta.  EGD showed ulcerated gastric mass, ? Leiomyoma vs GIST, etc.  It is high risk for re-bleeding, but he is also had high risk for occluding his cardiac stent that was placed just last month.  Per Dr. Silverio Decamp, we will restart his ASA 81 mg and Brilinta, but cardiology to see as well to weigh in on options in perioperative period/cardiac clearance, etc.  Will consult surgery.  *Acute blood loss on top of microcytic, iron deficiency anemia:  Has already received 3 units PRBC's and Hgb still around 6 grams.  Receiving 2 more units.  *Maternal hx of death from colon cancer age 66, pt has never had colonoscopy  *CAD. MI with RCA DES placement 09/12/15. On Brilinta, ASA since.    LOS: 1 day   ZEHR, JESSICA D.  10/30/2015, 9:18 AM  Pager number [phone]    Attending physician's note   I have taken an interval history, reviewed the chart and examined the patient. I agree with the Advanced Practitioner's note, impression and recommendations. Continues to have melena. S/p 3uPRBC no significant change in Hgb.  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 Await pathology results. No evidence of distant metastasis and radiologic findings suggestive of ?leiomyoma. Antiplatelet therapy as per cardiology given recent MI. Surgery consult team aware of patient and will evaluate him.   Damaris Hippo, MD [phone] Mon-Fri 8a-5p [phone] after 5p, weekends, holidays

## 2015-10-31 DIAGNOSIS — I5042 Chronic combined systolic (congestive) and diastolic (congestive) heart failure: Secondary | ICD-10-CM

## 2015-10-31 LAB — CBC
HCT: 23.6 % — ABNORMAL LOW (ref 39.0–52.0)
Hemoglobin: 7.9 g/dL — ABNORMAL LOW (ref 13.0–17.0)
MCH: 28.1 pg (ref 26.0–34.0)
MCHC: 33.5 g/dL (ref 30.0–36.0)
MCV: 84 fL (ref 78.0–100.0)
PLATELETS: 146 10*3/uL — AB (ref 150–400)
RBC: 2.81 MIL/uL — ABNORMAL LOW (ref 4.22–5.81)
RDW: 19.6 % — AB (ref 11.5–15.5)
WBC: 3.8 10*3/uL — AB (ref 4.0–10.5)

## 2015-10-31 LAB — BASIC METABOLIC PANEL
Anion gap: 6 (ref 5–15)
BUN: 11 mg/dL (ref 6–20)
CO2: 23 mmol/L (ref 22–32)
Calcium: 7.6 mg/dL — ABNORMAL LOW (ref 8.9–10.3)
Chloride: 110 mmol/L (ref 101–111)
Creatinine, Ser: 0.73 mg/dL (ref 0.61–1.24)
GFR calc Af Amer: >60 mL/min (ref >60)
GLUCOSE: 90 mg/dL (ref 65–99)
Potassium: 3.9 mmol/L (ref 3.5–5.1)
SODIUM: 139 mmol/L (ref 135–145)

## 2015-10-31 LAB — GLUCOSE, CAPILLARY: GLUCOSE-CAPILLARY: 79 mg/dL (ref 65–99)

## 2015-10-31 NOTE — Progress Notes (Signed)
Progress Note   Subjective  Patient received 2 units PRBC yesterday after he initially failed to have an appropriate response to 2 units the day before. Hgb rose from 6.4 to 7.9. He reports his last bowel movement was yesterday morning. No abdominal pains.    Objective   Vital signs in last 24 hours: Temp:  [98 F (36.7 C)-98.8 F (37.1 C)] 98 F (36.7 C) (04/01 [id number]) Pulse Rate:  [63-79] 63 (04/01 0632) Resp:  [12-18] 18 (04/01 [id number]) BP: (110-124)/(58-72) 124/72 mmHg (04/01 [id number]) SpO2:  [97 %-100 %] 97 % (04/01 [id number]) Last BM Date: 10/30/15 General:    white male in NAD Heart:  Regular rate and rhythm; no murmurs Lungs: Respirations even and unlabored, lungs CTA bilaterally Abdomen:  Soft, nontender and nondistended. Normal bowel sounds. Extremities:  Without edema. Neurologic:  Alert and oriented,  grossly normal neurologically. Psych:  Cooperative. Normal mood and affect.  Intake/Output from previous day: 03/31 0701 - 04/01 0700 In: 677.5 [Blood:677.5] Out: -  Intake/Output this shift:    Lab Results:  Recent Labs  10/30/15 0034 10/30/15 0807 10/31/15 0512  WBC 6.2 4.9 3.8*  HGB 6.8* 6.4* 7.9*  HCT 20.3* 19.6* 23.6*  PLT 164 161 146*   BMET  Recent Labs  10/29/15 1010 10/31/15 0512  NA 138 139  K 4.8 3.9  CL 112* 110  CO2 21* 23  GLUCOSE 136* 90  BUN 34* 11  CREATININE 0.77 0.73  CALCIUM 7.7* 7.6*   LFT  Recent Labs  10/29/15 1010  PROT 5.0*  ALBUMIN 2.8*  AST 19  ALT 17  ALKPHOS 45  BILITOT 0.2*   PT/INR  Recent Labs  10/29/15 1010  LABPROT 14.4  INR 1.10    Studies/Results: Dg Chest 2 View  10/29/2015  CLINICAL DATA:  Chest pain, shortness of breath. EXAM: CHEST  2 VIEW COMPARISON:  September 12, 2015. FINDINGS: The heart size and mediastinal contours are within normal limits. Both lungs are clear. No pneumothorax or pleural effusion is noted. The visualized skeletal structures are unremarkable. IMPRESSION: No active  cardiopulmonary disease. Electronically Signed   By: Marijo Conception, M.D.   On: 10/29/2015 10:07   Ct Chest W Contrast  10/29/2015  CLINICAL DATA:  55 y.o. male with Gastric mass. 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 Patient reports that he started having nausea, vomiting, diarrhea at about 7 PM. He has mild abdominal discomfort. He vomited once with bright red blood and some blood clots, described as "good amount of blood". EXAM: CT CHEST, ABDOMEN, AND PELVIS WITH CONTRAST TECHNIQUE: Multidetector CT imaging of the chest, abdomen and pelvis was performed following the standard protocol during bolus administration of intravenous contrast. CONTRAST:  138mL ISOVUE-300 IOPAMIDOL (ISOVUE-300) INJECTION 61%, 57mL OMNIPAQUE IOHEXOL 300 MG/ML SOLN COMPARISON:  None. FINDINGS: CT CHEST Neck base and axilla: No mass or adenopathy. Visualized thyroid is unremarkable. Mediastinum and hila: Heart normal in size and configuration. Great vessels normal in caliber. No atherosclerotic plaque. No mediastinal or hilar masses or pathologically enlarged lymph nodes. Lungs and pleura: 3.8 mm right lower lobe nodule with an adjacent 2 mm nodule. Otherwise clear. No pleural effusion. No pneumothorax. CT ABDOMEN AND PELVIS Liver, spleen, gallbladder, pancreas, adrenal glands:  Normal. Kidneys, ureters, bladder:  Normal. Lymph nodes:  No pathologically enlarged  lymph nodes. Ascites:  None. Gastrointestinal: 3.7 cm round smooth mass projects along the posterior wall of the mid stomach. No other stomach mass. Normal small bowel. Normal colon. Normal appendix. MUSCULOSKELETAL Mild degenerative changes along the thoracic spine. No osteoblastic or osteolytic lesions. IMPRESSION: 1. 3.7 cm gastric mass. This has smooth margins. It may reflect a  leiomyoma. 2. No evidence of locally invasive gastric malignancy or of metastatic disease. 3. No acute findings within the chest, abdomen or pelvis. Electronically Signed   By: Lajean Manes M.D.   On: 10/29/2015 20:49   Ct Abdomen Pelvis W Contrast  10/29/2015  CLINICAL DATA:  55 y.o. male with Gastric mass. 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 Patient reports that he started having nausea, vomiting, diarrhea at about 7 PM. He has mild abdominal discomfort. He vomited once with bright red blood and some blood clots, described as "good amount of blood". EXAM: CT CHEST, ABDOMEN, AND PELVIS WITH CONTRAST TECHNIQUE: Multidetector CT imaging of the chest, abdomen and pelvis was performed following the standard protocol during bolus administration of intravenous contrast. CONTRAST:  180mL ISOVUE-300 IOPAMIDOL (ISOVUE-300) INJECTION 61%, 68mL OMNIPAQUE IOHEXOL 300 MG/ML SOLN COMPARISON:  None. FINDINGS: CT CHEST Neck base and axilla: No mass or adenopathy. Visualized thyroid is unremarkable. Mediastinum and hila: Heart normal in size and configuration. Great vessels normal in caliber. No atherosclerotic plaque. No mediastinal or hilar masses or pathologically enlarged lymph nodes. Lungs and pleura: 3.8 mm right lower lobe nodule with an adjacent 2 mm nodule. Otherwise clear. No pleural effusion. No pneumothorax. CT ABDOMEN AND PELVIS Liver, spleen, gallbladder, pancreas, adrenal glands:  Normal. Kidneys, ureters, bladder:  Normal. Lymph nodes:  No pathologically enlarged lymph nodes. Ascites:  None. Gastrointestinal: 3.7 cm round smooth mass projects along the posterior wall of the mid stomach. No other stomach mass. Normal small bowel. Normal colon. Normal appendix. MUSCULOSKELETAL Mild degenerative changes along the  thoracic spine. No osteoblastic or osteolytic lesions. IMPRESSION: 1. 3.7 cm gastric mass. This has smooth margins. 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 2. No evidence of locally invasive gastric malignancy or of metastatic disease. 3. No acute findings within the chest, abdomen or pelvis. Electronically Signed   By: Lajean Manes M.D.   On: 10/29/2015 20:49       Assessment / Plan:   55 y/o male with history of CAD s/p cardiac stent last month on aspirin and Brinlinta, who presented with an upper GI bleed. EGD per Dr. Silverio Decamp showed a diffusely ulcerated gastric mass, large subepithelial lesion, which was not amenable to endoscopic hemostasis. Biopsies returned showing spindle cells, immunohistochemistry and final path pending. No evidence of metastasis on CT scan.  He has not had bowel movement since yesterday morning. His Hgb is improved following 2 units PRBC. Brilinta held at this point, continuing aspirin.   Overall he does not appear to be actively bleeding at this time and has stabalized, although is at high risk for recurrent bleeding given his need for antiplatelet therapy. Moving forward this gastric lesion does not appear to be amenable to endoscopic hemostasis and will need surgery for definitive therapy. I would continue IV protonix drip at this time. Appreciate general surgery and cardiology recommendations regarding timing of further intervention and choice of antiplatelet therapy.   Please call with questions / changes in his status.  [place] Cellar, MD [facility] Pager [phone]     LOS: 2 days   Renelda Loma Gunter Conde  10/31/2015, 7:48 AM

## 2015-10-31 NOTE — Progress Notes (Signed)
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 The patient complained of nausea and vomiting and diarrhea on 10/29/2015. He had some bright red blood and hematemesis. Altogether, he stated he vomited approximately 4-5 times. Of note, patient was recently hospitalized from 2/11 to 09/14/15 because of STEMI. He is s/p of drug-eluting stent placement and has been on ASA and Brilina. Per previous discharge summary, pt had occasional red blood mixed with stool that he assumed to be hemorrhoids. The patient's hemoglobin was noted to be 6.5 at the time of admission, below his baseline of 9. As result, the patient was admitted for further evaluation.  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 Surgery--awaiting final surgical plans -10/30/2015--transfuse 2 additional units PRBC (4 units total) -continue Protonix drip -advance diet if no imminent surgery  CAD -recent STEMI with RCA DES--09/12/15 -appreciate cardiology consult--> continue ASA 81 mg, hold Brillinta -If it is felt that surgery will be on hold,  consider adding Plavix 75 mg once a day instead of Brilinta -anticoagulation per GI/cardiology -IV heparin during perioperative period -OK for surgery per cardiology  Ischemic  cardiomyopathy/chronic systolic and diastolic CHF -123456 echo EF 40-45 percent, grade 2 DD -Continue carvedilol and statin -daily weight  Acute blood loss anemia -Transfused 4 units PRBC total -Monitor hemodynamically -trend hemoglobin--stable after last 2 units PRBCs    Family Communication: Wife updated at beside 4/1 Disposition Plan: Home when medically stable   Procedures/Studies: Dg Chest 2 View  10/29/2015  CLINICAL DATA:  Chest pain, shortness of breath. EXAM: CHEST  2 VIEW COMPARISON:  September 12, 2015. FINDINGS: The heart size and mediastinal contours are within normal limits. Both lungs are clear. No pneumothorax or pleural effusion is noted. The visualized skeletal structures are unremarkable. IMPRESSION: No active cardiopulmonary disease. Electronically Signed   By: Marijo Conception, M.D.   On: 10/29/2015 10:07   Ct Chest W Contrast  10/29/2015  CLINICAL DATA:  55 y.o. male with Gastric mass. 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 Patient reports that he started having nausea, vomiting, diarrhea at about 7 PM. He has mild abdominal discomfort. He vomited once with bright red blood and some blood clots, described as "good amount of blood". EXAM: CT CHEST, ABDOMEN, AND PELVIS WITH CONTRAST TECHNIQUE: Multidetector CT imaging of the chest, abdomen and pelvis was performed following the standard protocol during bolus administration of intravenous contrast. CONTRAST:  155mL ISOVUE-300 IOPAMIDOL (ISOVUE-300) INJECTION 61%, 81mL OMNIPAQUE IOHEXOL 300 MG/ML SOLN COMPARISON:  None. FINDINGS: CT CHEST Neck base and axilla: No mass or adenopathy. Visualized thyroid is unremarkable. Mediastinum and hila: Heart normal in size and configuration. Great vessels normal in caliber. No atherosclerotic  plaque. No mediastinal or hilar masses or pathologically  enlarged lymph nodes. Lungs and pleura: 3.8 mm right lower lobe nodule with an adjacent 2 mm nodule. Otherwise clear. No pleural effusion. No pneumothorax. CT ABDOMEN AND PELVIS Liver, spleen, gallbladder, pancreas, adrenal glands:  Normal. Kidneys, ureters, bladder:  Normal. Lymph nodes:  No pathologically enlarged lymph nodes. Ascites:  None. Gastrointestinal: 3.7 cm round smooth mass projects along the posterior wall of the mid stomach. No other stomach mass. Normal small bowel. Normal colon. Normal appendix. MUSCULOSKELETAL Mild degenerative changes along the thoracic spine. No osteoblastic or osteolytic lesions. IMPRESSION: 1. 3.7 cm gastric mass. This has smooth margins. 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 2. No evidence of locally invasive gastric malignancy or of metastatic disease. 3. No acute findings within the chest, abdomen or pelvis. Electronically Signed   By: Lajean Manes M.D.   On: 10/29/2015 20:49   Ct Abdomen Pelvis W Contrast  10/29/2015  CLINICAL DATA:  55 y.o. male with Gastric mass. 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 Patient reports that he started having nausea, vomiting, diarrhea at about 7 PM. He has mild abdominal discomfort. He vomited once with bright red blood and some blood clots, described as "good amount of blood". EXAM: CT CHEST, ABDOMEN, AND PELVIS WITH CONTRAST TECHNIQUE: Multidetector CT imaging of the chest, abdomen and pelvis was performed following the standard protocol during bolus administration of intravenous contrast. CONTRAST:  161mL ISOVUE-300 IOPAMIDOL (ISOVUE-300) INJECTION 61%, 52mL OMNIPAQUE IOHEXOL 300 MG/ML SOLN COMPARISON:  None. FINDINGS: CT CHEST Neck base and axilla: No mass or adenopathy. Visualized thyroid is  unremarkable. Mediastinum and hila: Heart normal in size and configuration. Great vessels normal in caliber. No atherosclerotic plaque. No mediastinal or hilar masses or pathologically enlarged lymph nodes. Lungs and pleura: 3.8 mm right lower lobe nodule with an adjacent 2 mm nodule. Otherwise clear. No pleural effusion. No pneumothorax. CT ABDOMEN AND PELVIS Liver, spleen, gallbladder, pancreas, adrenal glands:  Normal. Kidneys, ureters, bladder:  Normal. Lymph nodes:  No pathologically enlarged lymph nodes. Ascites:  None. Gastrointestinal: 3.7 cm round smooth mass projects along the posterior wall of the mid stomach. No other stomach mass. Normal small bowel. Normal colon. Normal appendix. MUSCULOSKELETAL Mild degenerative changes along the thoracic spine. No osteoblastic or osteolytic lesions. IMPRESSION: 1. 3.7 cm gastric mass. This has smooth margins. 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 2. No evidence of locally invasive gastric malignancy or of metastatic disease. 3. No acute findings within the chest, abdomen or pelvis. Electronically Signed   By: Lajean Manes M.D.   On: 10/29/2015 20:49         Subjective: Patient denies fevers, chills, headache, chest pain, dyspnea, nausea, vomiting, diarrhea, abdominal pain, dysuria, hematuria   Objective: Filed Vitals:   10/30/15 1628 10/30/15 2153 10/31/15 0632 10/31/15 1643  BP: 122/66 113/62 124/72   Pulse: 66 75 63   Temp: 98.7 F (37.1 C) 98.5 F (36.9 C) 98 F (36.7 C)   TempSrc: Oral Oral Oral   Resp: 18 12 18    Height:      Weight:    123.651 kg (272 lb 9.6 oz)  SpO2: 99% 98% 97%    No intake or output data in the 24 hours ending 10/31/15 1847 Weight change:  Exam:   General:  Pt is alert, follows commands appropriately, not in acute distress  HEENT: No icterus, No  thrush, No neck mass, [place]/AT  Cardiovascular: RRR, S1/S2, no rubs, no gallops  Respiratory: CTA bilaterally, no wheezing, no crackles, no rhonchi  Abdomen: Soft/+BS,  non tender, non distended, no guarding  Extremities: No edema, No lymphangitis, No petechiae, No rashes, no synovitis  Data Reviewed: Basic Metabolic Panel:  Recent Labs Lab 10/29/15 1010 10/31/15 0512  NA 138 139  K 4.8 3.9  CL 112* 110  CO2 21* 23  GLUCOSE 136* 90  BUN 34* 11  CREATININE 0.77 0.73  CALCIUM 7.7* 7.6*   Liver Function Tests:  Recent Labs Lab 10/29/15 1010  AST 19  ALT 17  ALKPHOS 45  BILITOT 0.2*  PROT 5.0*  ALBUMIN 2.8*   No results for input(s): LIPASE, AMYLASE in the last 168 hours. No results for input(s): AMMONIA in the last 168 hours. CBC:  Recent Labs Lab 10/29/15 1010 10/30/15 0034 10/30/15 0807 10/31/15 0512  WBC 8.7 6.2 4.9 3.8*  NEUTROABS 6.7  --   --   --   HGB 6.5* 6.8* 6.4* 7.9*  HCT 20.2* 20.3* 19.6* 23.6*  MCV 82.1 83.9 83.4 84.0  PLT 240 164 161 146*   Cardiac Enzymes: No results for input(s): CKTOTAL, CKMB, CKMBINDEX, TROPONINI in the last 168 hours. BNP: Invalid input(s): POCBNP CBG:  Recent Labs Lab 10/30/15 0745 10/31/15 0751  GLUCAP 96 79    No results found for this or any previous visit (from the past 240 hour(s)).   Scheduled Meds: . aspirin  81 mg Oral Daily  . atorvastatin  80 mg Oral q1800  . carvedilol  3.125 mg Oral BID WC  . dextromethorphan-guaiFENesin  1 tablet Oral BID  . multivitamin with minerals  1 tablet Oral Daily  . sodium chloride flush  3 mL Intravenous Q12H   Continuous Infusions: . sodium chloride 50 mL/hr at 10/30/15 0611  . pantoprozole (PROTONIX) infusion 8 mg/hr (10/30/15 1626)     Nylani Michetti, DO  [facility] Pager [phone]  If 7PM-7AM, please contact night-coverage [email or web address] Password [facility] 10/31/2015, 6:47 PM   LOS: 2 days

## 2015-10-31 NOTE — Progress Notes (Signed)
Resumed care of patient. Agree with previous assessment. Orders reviewed and will continue to monitor.

## 2015-10-31 NOTE — Progress Notes (Signed)
Subjective: No CP  NO SOB   Objective: Filed Vitals:   10/30/15 1341 10/30/15 1628 10/30/15 2153 10/31/15 0632  BP: 113/58 122/66 113/62 124/72  Pulse: 73 66 75 63  Temp: 98.2 F (36.8 C) 98.7 F (37.1 C) 98.5 F (36.9 C) 98 F (36.7 C)  TempSrc: Oral Oral Oral Oral  Resp: 16 18 12 18   Height:      Weight:      SpO2: 99% 99% 98% 97%   Weight change:   Intake/Output Summary (Last 24 hours) at 10/31/15 0715 Last data filed at 10/30/15 1627  Gross per 24 hour  Intake  677.5 ml  Output      0 ml  Net  677.5 ml    General: Alert, awake, oriented x3, in no acute distress Neck:  JVP is normal Heart: Regular rate and rhythm, without murmurs, rubs, gallops.  Lungs: Clear to auscultation.  No rales or wheezes. Exemities:  No edema.   Neuro: Grossly intact, nonfocal.  Tele:  SR   Lab Results: Results for orders placed or performed during the hospital encounter of 10/29/15 (from the past 24 hour(s))  Glucose, capillary     Status: None   Collection Time: 10/30/15  7:45 AM  Result Value Ref Range   Glucose-Capillary 96 65 - 99 mg/dL   Comment 1 Notify RN    Comment 2 Document in Chart   CBC     Status: Abnormal   Collection Time: 10/30/15  8:07 AM  Result Value Ref Range   WBC 4.9 4.0 - 10.5 K/uL   RBC 2.35 (L) 4.22 - 5.81 MIL/uL   Hemoglobin 6.4 (LL) 13.0 - 17.0 g/dL   HCT 19.6 (L) 39.0 - 52.0 %   MCV 83.4 78.0 - 100.0 fL   MCH 27.2 26.0 - 34.0 pg   MCHC 32.7 30.0 - 36.0 g/dL   RDW 21.5 (H) 11.5 - 15.5 %   Platelets 161 150 - 400 K/uL  Prepare RBC     Status: None   Collection Time: 10/30/15 10:00 AM  Result Value Ref Range   Order Confirmation ORDER PROCESSED BY BLOOD BANK   CBC     Status: Abnormal   Collection Time: 10/31/15  5:12 AM  Result Value Ref Range   WBC 3.8 (L) 4.0 - 10.5 K/uL   RBC 2.81 (L) 4.22 - 5.81 MIL/uL   Hemoglobin 7.9 (L) 13.0 - 17.0 g/dL   HCT 23.6 (L) 39.0 - 52.0 %   MCV 84.0 78.0 - 100.0 fL   MCH 28.1 26.0 - 34.0 pg   MCHC 33.5  30.0 - 36.0 g/dL   RDW 19.6 (H) 11.5 - 15.5 %   Platelets 146 (L) 150 - 400 K/uL  Basic metabolic panel     Status: Abnormal   Collection Time: 10/31/15  5:12 AM  Result Value Ref Range   Sodium 139 135 - 145 mmol/L   Potassium 3.9 3.5 - 5.1 mmol/L   Chloride 110 101 - 111 mmol/L   CO2 23 22 - 32 mmol/L   Glucose, Bld 90 65 - 99 mg/dL   BUN 11 6 - 20 mg/dL   Creatinine, Ser 0.73 0.61 - 1.24 mg/dL   Calcium 7.6 (L) 8.9 - 10.3 mg/dL   GFR calc non Af Amer >60 >60 mL/min   GFR calc Af Amer >60 >60 mL/min   Anion gap 6 5 - 15    Studies/Results: No results found.  Medications:REviewed    @PROBHOSP @  1  CAD  Pt with STEMI in Feb 2017  underewnt PCI    Currently brilinta is on hold  Due to anemia and GI  Would keep on ASA  Again, hesitant to add heparin now with anemia  2  Anemia  H/H bumped with transfusion    3  Chronic systooic CHF  Volume status is OK   4 GI  Surgery has seen pt this AM  Note possible surgery OK to proceed  WOuld start anticoagulation with heparin periop/postop depending on timing       LOS: 2 days   Dorris Carnes 10/31/2015, 7:15 AM

## 2015-10-31 NOTE — Progress Notes (Signed)
2 Days Post-Op  Subjective: No complaints  Objective: Vital signs in last 24 hours: Temp:  [98 F (36.7 C)-98.8 F (37.1 C)] 98 F (36.7 C) (04/01 [id number]) Pulse Rate:  [63-79] 63 (04/01 0632) Resp:  [12-18] 18 (04/01 [id number]) BP: (113-124)/(58-72) 124/72 mmHg (04/01 0632) SpO2:  [97 %-100 %] 97 % (04/01 [id number]) Last BM Date: 10/30/15  Intake/Output from previous day: 03/31 0701 - 04/01 0700 In: 677.5 [Blood:677.5] Out: -  Intake/Output this shift:    Resp: clear to auscultation bilaterally Cardio: regular rate and rhythm GI: soft, non-tender; bowel sounds normal; no masses,  no organomegaly  Lab Results:   Recent Labs  10/30/15 0807 10/31/15 0512  WBC 4.9 3.8*  HGB 6.4* 7.9*  HCT 19.6* 23.6*  PLT 161 146*   BMET  Recent Labs  10/29/15 1010 10/31/15 0512  NA 138 139  K 4.8 3.9  CL 112* 110  CO2 21* 23  GLUCOSE 136* 90  BUN 34* 11  CREATININE 0.77 0.73  CALCIUM 7.7* 7.6*   PT/INR  Recent Labs  10/29/15 1010  LABPROT 14.4  INR 1.10   ABG No results for input(s): PHART, HCO3 in the last 72 hours.  Invalid input(s): PCO2, PO2  Studies/Results: Dg Chest 2 View  10/29/2015  CLINICAL DATA:  Chest pain, shortness of breath. EXAM: CHEST  2 VIEW COMPARISON:  September 12, 2015. FINDINGS: The heart size and mediastinal contours are within normal limits. Both lungs are clear. No pneumothorax or pleural effusion is noted. The visualized skeletal structures are unremarkable. IMPRESSION: No active cardiopulmonary disease. Electronically Signed   By: Marijo Conception, M.D.   On: 10/29/2015 10:07   Ct Chest W Contrast  10/29/2015  CLINICAL DATA:  55 y.o. male with Gastric mass. 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 Patient reports that he started having nausea, vomiting, diarrhea  at about 7 PM. He has mild abdominal discomfort. He vomited once with bright red blood and some blood clots, described as "good amount of blood". EXAM: CT CHEST, ABDOMEN, AND PELVIS WITH CONTRAST TECHNIQUE: Multidetector CT imaging of the chest, abdomen and pelvis was performed following the standard protocol during bolus administration of intravenous contrast. CONTRAST:  169mL ISOVUE-300 IOPAMIDOL (ISOVUE-300) INJECTION 61%, 37mL OMNIPAQUE IOHEXOL 300 MG/ML SOLN COMPARISON:  None. FINDINGS: CT CHEST Neck base and axilla: No mass or adenopathy. Visualized thyroid is unremarkable. Mediastinum and hila: Heart normal in size and configuration. Great vessels normal in caliber. No atherosclerotic plaque. No mediastinal or hilar masses or pathologically enlarged lymph nodes. Lungs and pleura: 3.8 mm right lower lobe nodule with an adjacent 2 mm nodule. Otherwise clear. No pleural effusion. No pneumothorax. CT ABDOMEN AND PELVIS Liver, spleen, gallbladder, pancreas, adrenal glands:  Normal. Kidneys, ureters, bladder:  Normal. Lymph nodes:  No pathologically enlarged lymph nodes. Ascites:  None. Gastrointestinal: 3.7 cm round smooth mass projects along the posterior wall of the mid stomach. No other stomach mass. Normal small bowel. Normal colon. Normal appendix. MUSCULOSKELETAL Mild degenerative changes along the thoracic spine. No osteoblastic or osteolytic lesions. IMPRESSION: 1. 3.7 cm gastric mass. This has smooth margins. 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 2. No evidence of locally invasive gastric malignancy or of metastatic disease. 3. No acute findings within the chest, abdomen or pelvis. Electronically Signed   By: Dedra Skeens.D.  On: 10/29/2015 20:49   Ct Abdomen Pelvis W Contrast  10/29/2015  CLINICAL DATA:  55 y.o. male with Gastric mass. 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 Patient reports that he started having nausea, vomiting, diarrhea at about 7 PM. He has mild abdominal discomfort. He vomited once with bright red blood and some blood clots, described as "good amount of blood". EXAM: CT CHEST, ABDOMEN, AND PELVIS WITH CONTRAST TECHNIQUE: Multidetector CT imaging of the chest, abdomen and pelvis was performed following the standard protocol during bolus administration of intravenous contrast. CONTRAST:  189mL ISOVUE-300 IOPAMIDOL (ISOVUE-300) INJECTION 61%, 90mL OMNIPAQUE IOHEXOL 300 MG/ML SOLN COMPARISON:  None. FINDINGS: CT CHEST Neck base and axilla: No mass or adenopathy. Visualized thyroid is unremarkable. Mediastinum and hila: Heart normal in size and configuration. Great vessels normal in caliber. No atherosclerotic plaque. No mediastinal or hilar masses or pathologically enlarged lymph nodes. Lungs and pleura: 3.8 mm right lower lobe nodule with an adjacent 2 mm nodule. Otherwise clear. No pleural effusion. No pneumothorax. CT ABDOMEN AND PELVIS Liver, spleen, gallbladder, pancreas, adrenal glands:  Normal. Kidneys, ureters, bladder:  Normal. Lymph nodes:  No pathologically enlarged lymph nodes. Ascites:  None. Gastrointestinal: 3.7 cm round smooth mass projects along the posterior wall of the mid stomach. No other stomach mass. Normal small bowel. Normal colon. Normal appendix. MUSCULOSKELETAL Mild degenerative changes along the thoracic spine. No osteoblastic or osteolytic lesions. IMPRESSION: 1. 3.7 cm gastric mass. This has smooth margins. 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 2. No evidence of locally invasive gastric malignancy or of metastatic disease. 3. No acute findings within the chest, abdomen or pelvis. Electronically Signed   By: Lajean Manes M.D.   On: 10/29/2015 20:49    Anti-infectives: Anti-infectives    None      Assessment/Plan: s/p Procedure(s): ESOPHAGOGASTRODUODENOSCOPY (EGD)  (N/A) Await pathology  Will discuss with cardiology the timing of possible surgery Continue blood thinners for recent MI No new recs  LOS: 2 days    TOTH III,PAUL S 10/31/2015

## 2015-11-01 DIAGNOSIS — K319 Disease of stomach and duodenum, unspecified: Secondary | ICD-10-CM

## 2015-11-01 LAB — CBC
HCT: 24.1 % — ABNORMAL LOW (ref 39.0–52.0)
HEMOGLOBIN: 8 g/dL — AB (ref 13.0–17.0)
MCH: 28 pg (ref 26.0–34.0)
MCHC: 33.2 g/dL (ref 30.0–36.0)
MCV: 84.3 fL (ref 78.0–100.0)
Platelets: 168 10*3/uL (ref 150–400)
RBC: 2.86 MIL/uL — AB (ref 4.22–5.81)
RDW: 18.9 % — ABNORMAL HIGH (ref 11.5–15.5)
WBC: 3.8 10*3/uL — ABNORMAL LOW (ref 4.0–10.5)

## 2015-11-01 LAB — GLUCOSE, CAPILLARY: GLUCOSE-CAPILLARY: 77 mg/dL (ref 65–99)

## 2015-11-01 NOTE — Progress Notes (Signed)
   Subjective: No CP  No SOB   Objective: Filed Vitals:   10/31/15 2149 10/31/15 2250 11/01/15 0448 11/01/15 0704  BP: 119/67  130/74   Pulse: 61 61 66   Temp: 98.8 F (37.1 C)  98.5 F (36.9 C)   TempSrc: Oral  Oral   Resp: 18 18 18    Height:      Weight:    270 lb 12.8 oz (122.834 kg)  SpO2: 97% 96% 100%    Weight change:   Intake/Output Summary (Last 24 hours) at 11/01/15 0729 Last data filed at 11/01/15 0448  Gross per 24 hour  Intake   4570 ml  Output      0 ml  Net   4570 ml   +5.3 L    General: Alert, awake, oriented x3, in no acute distress Neck:  JVP is normal Heart: Regular rate and rhythm, without murmurs, rubs, gallops.  Lungs: Clear to auscultation.  No rales or wheezes. Exemities:  No edema.   Neuro: Grossly intact, nonfocal.   Lab Results: Results for orders placed or performed during the hospital encounter of 10/29/15 (from the past 24 hour(s))  Glucose, capillary     Status: None   Collection Time: 10/31/15  7:51 AM  Result Value Ref Range   Glucose-Capillary 79 65 - 99 mg/dL  CBC     Status: Abnormal   Collection Time: 11/01/15  5:05 AM  Result Value Ref Range   WBC 3.8 (L) 4.0 - 10.5 K/uL   RBC 2.86 (L) 4.22 - 5.81 MIL/uL   Hemoglobin 8.0 (L) 13.0 - 17.0 g/dL   HCT 24.1 (L) 39.0 - 52.0 %   MCV 84.3 78.0 - 100.0 fL   MCH 28.0 26.0 - 34.0 pg   MCHC 33.2 30.0 - 36.0 g/dL   RDW 18.9 (H) 11.5 - 15.5 %   Platelets 168 150 - 400 K/uL    Studies/Results: No results found.  Medications:Reviewed  @PROBHOSP @  1  CAD  STEMI 2/17  PCI/Stent  Brlinta  on hold    Got dose on 3/31  Prior to that had missed doses until 3/29   On ASA now  OK to go for surgery given lapses  On Monday  WOuld  Resume on following AM post surgery  Continue ASA.     2  Anemia  Hgb holding    3  Chronic systolic CHF  Volume status is OK    4  GI  SUrgery has seen pt  See above  Re Brilinta  LOS: 3 days   Dorris Carnes 11/01/2015, 7:29 AM

## 2015-11-01 NOTE — Progress Notes (Signed)
Coreg due but Heart rate in the 50s and in the 40s over night, MD- Dr. Carles Collet aware stated to hold coreg this AM. Will continue to monitor.

## 2015-11-01 NOTE — Progress Notes (Signed)
Patient's Hgb is stable this AM. Subepithelial gastric lesion is not amenable to endoscopic therapy, appreciate surgical and cardiology assistance for timing of surgery / more definitive therapy. Final pathology with immunohistochemical staining is pending. We will follow peripherally for now, please call with questions / concerns about his case moving forward.   [place] C[place], MD [facility] Pager [phone]

## 2015-11-01 NOTE — Progress Notes (Signed)
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 The patient complained of nausea and vomiting and diarrhea on 10/29/2015. He had some bright red blood and hematemesis. Altogether, he stated he vomited approximately 4-5 times. Of note, patient was recently hospitalized from 2/11 to 09/14/15 because of STEMI. He is s/p of drug-eluting stent placement and has been on ASA and Brilina. Per previous discharge summary, pt had occasional red blood mixed with stool that he assumed to be hemorrhoids. The patient's hemoglobin was noted to be 6.5 at the time of admission, below his baseline of 9. As result, the patient was admitted for further evaluation.  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 Surgery--awaiting final surgical plans -10/30/2015--transfuse 2 additional units PRBC (4 units total) -continue Protonix drip -advance diet if no imminent surgery  CAD -recent STEMI with RCA DES--09/12/15 -appreciate cardiology consult--> continue ASA 81 mg, hold Brillinta -If it is felt that surgery will be on hold, consider adding Plavix 75 mg once a day instead of Brilinta -anticoagulation per GI/cardiology -OK for surgery per cardiology  Ischemic cardiomyopathy/chronic systolic and  diastolic CHF -123456 echo EF 40-45 percent, grade 2 DD -Continue carvedilol and statin -daily weight  Acute blood loss anemia -Transfused 4 units PRBC total -Monitor hemodynamically -trend hemoglobin--stable after last 2 units PRBCs    Family Communication: Wife updated at beside 4/2 Disposition Plan: Home when medically stable--several days   Procedures/Studies: Dg Chest 2 View  10/29/2015  CLINICAL DATA:  Chest pain, shortness of breath. EXAM: CHEST  2 VIEW COMPARISON:  September 12, 2015. FINDINGS: The heart size and mediastinal contours are within normal limits. Both lungs are clear. No pneumothorax or pleural effusion is noted. The visualized skeletal structures are unremarkable. IMPRESSION: No active cardiopulmonary disease. Electronically Signed   By: Marijo Conception, M.D.   On: 10/29/2015 10:07   Ct Chest W Contrast  10/29/2015  CLINICAL DATA:  55 y.o. male with Gastric mass. 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 Patient reports that he started having nausea, vomiting, diarrhea at about 7 PM. He has mild abdominal discomfort. He vomited once with bright red blood and some blood clots, described as "good amount of blood". EXAM: CT CHEST, ABDOMEN, AND PELVIS WITH CONTRAST TECHNIQUE: Multidetector CT imaging of the chest, abdomen and pelvis was performed following the standard protocol during bolus administration of intravenous contrast. CONTRAST:  169mL ISOVUE-300 IOPAMIDOL (ISOVUE-300) INJECTION 61%, 37mL OMNIPAQUE IOHEXOL 300 MG/ML SOLN COMPARISON:  None. FINDINGS: CT CHEST Neck base and axilla: No mass or adenopathy. Visualized thyroid is unremarkable. Mediastinum and hila: Heart normal in size and configuration. Great vessels normal in caliber. No atherosclerotic plaque. No  mediastinal or hilar masses or pathologically enlarged lymph nodes.  Lungs and pleura: 3.8 mm right lower lobe nodule with an adjacent 2 mm nodule. Otherwise clear. No pleural effusion. No pneumothorax. CT ABDOMEN AND PELVIS Liver, spleen, gallbladder, pancreas, adrenal glands:  Normal. Kidneys, ureters, bladder:  Normal. Lymph nodes:  No pathologically enlarged lymph nodes. Ascites:  None. Gastrointestinal: 3.7 cm round smooth mass projects along the posterior wall of the mid stomach. No other stomach mass. Normal small bowel. Normal colon. Normal appendix. MUSCULOSKELETAL Mild degenerative changes along the thoracic spine. No osteoblastic or osteolytic lesions. IMPRESSION: 1. 3.7 cm gastric mass. This has smooth margins. 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 2. No evidence of locally invasive gastric malignancy or of metastatic disease. 3. No acute findings within the chest, abdomen or pelvis. Electronically Signed   By: Lajean Manes M.D.   On: 10/29/2015 20:49   Ct Abdomen Pelvis W Contrast  10/29/2015  CLINICAL DATA:  55 y.o. male with Gastric mass. 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 Patient reports that he started having nausea, vomiting, diarrhea at about 7 PM. He has mild abdominal discomfort. He vomited once with bright red blood and some blood clots, described as "good amount of blood". EXAM: CT CHEST, ABDOMEN, AND PELVIS WITH CONTRAST TECHNIQUE: Multidetector CT imaging of the chest, abdomen and pelvis was performed following the standard protocol during bolus administration of intravenous contrast. CONTRAST:  182mL ISOVUE-300 IOPAMIDOL (ISOVUE-300) INJECTION 61%, 64mL OMNIPAQUE IOHEXOL 300 MG/ML SOLN COMPARISON:  None. FINDINGS: CT CHEST Neck base and axilla: No mass or adenopathy. Visualized thyroid is  unremarkable. Mediastinum and hila: Heart normal in size and configuration. Great vessels normal in caliber. No atherosclerotic plaque. No mediastinal or hilar masses or pathologically enlarged lymph nodes. Lungs and pleura: 3.8 mm right lower lobe nodule with an adjacent 2 mm nodule. Otherwise clear. No pleural effusion. No pneumothorax. CT ABDOMEN AND PELVIS Liver, spleen, gallbladder, pancreas, adrenal glands:  Normal. Kidneys, ureters, bladder:  Normal. Lymph nodes:  No pathologically enlarged lymph nodes. Ascites:  None. Gastrointestinal: 3.7 cm round smooth mass projects along the posterior wall of the mid stomach. No other stomach mass. Normal small bowel. Normal colon. Normal appendix. MUSCULOSKELETAL Mild degenerative changes along the thoracic spine. No osteoblastic or osteolytic lesions. IMPRESSION: 1. 3.7 cm gastric mass. This has smooth margins. 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 2. No evidence of locally invasive gastric malignancy or of metastatic disease. 3. No acute findings within the chest, abdomen or pelvis. Electronically Signed   By: Lajean Manes M.D.   On: 10/29/2015 20:49         Subjective: Patient denies fevers, chills, headache, chest pain, dyspnea, nausea, vomiting, diarrhea, abdominal pain, dysuria, hematuria   Objective: Filed Vitals:   11/01/15 0704 11/01/15 0900 11/01/15 1332 11/01/15 1726  BP:  127/66 114/59 129/71  Pulse:  50 74 67  Temp:   98.1 F (36.7 C)   TempSrc:   Oral   Resp:      Height:      Weight: 122.834 kg (270 lb 12.8 oz)     SpO2:   98%     Intake/Output Summary (Last 24 hours) at 11/01/15 1837 Last data filed at 11/01/15 1331  Gross per 24 hour  Intake   5170 ml  Output      0 ml  Net   5170 ml   Weight  change:  Exam:   General:  Pt is alert, follows commands appropriately, not in acute distress  HEENT: No icterus, No thrush, No neck mass, [place]/AT  Cardiovascular: RRR, S1/S2, no rubs, no gallops  Respiratory: CTA bilaterally, no  wheezing, no crackles, no rhonchi  Abdomen: Soft/+BS, non tender, non distended, no guarding  Extremities: No edema, No lymphangitis, No petechiae, No rashes, no synovitis  Data Reviewed: Basic Metabolic Panel:  Recent Labs Lab 10/29/15 1010 10/31/15 0512  NA 138 139  K 4.8 3.9  CL 112* 110  CO2 21* 23  GLUCOSE 136* 90  BUN 34* 11  CREATININE 0.77 0.73  CALCIUM 7.7* 7.6*   Liver Function Tests:  Recent Labs Lab 10/29/15 1010  AST 19  ALT 17  ALKPHOS 45  BILITOT 0.2*  PROT 5.0*  ALBUMIN 2.8*   No results for input(s): LIPASE, AMYLASE in the last 168 hours. No results for input(s): AMMONIA in the last 168 hours. CBC:  Recent Labs Lab 10/29/15 1010 10/30/15 0034 10/30/15 0807 10/31/15 0512 11/01/15 0505  WBC 8.7 6.2 4.9 3.8* 3.8*  NEUTROABS 6.7  --   --   --   --   HGB 6.5* 6.8* 6.4* 7.9* 8.0*  HCT 20.2* 20.3* 19.6* 23.6* 24.1*  MCV 82.1 83.9 83.4 84.0 84.3  PLT 240 164 161 146* 168   Cardiac Enzymes: No results for input(s): CKTOTAL, CKMB, CKMBINDEX, TROPONINI in the last 168 hours. BNP: Invalid input(s): POCBNP CBG:  Recent Labs Lab 10/30/15 0745 10/31/15 0751 11/01/15 0735  GLUCAP 96 79 77    No results found for this or any previous visit (from the past 240 hour(s)).   Scheduled Meds: . aspirin  81 mg Oral Daily  . atorvastatin  80 mg Oral q1800  . carvedilol  3.125 mg Oral BID WC  . dextromethorphan-guaiFENesin  1 tablet Oral BID  . multivitamin with minerals  1 tablet Oral Daily  . sodium chloride flush  3 mL Intravenous Q12H   Continuous Infusions: . sodium chloride 50 mL/hr at 11/01/15 1725     Marilu Rylander, DO  [facility] Pager [phone]  If 7PM-7AM, please contact night-coverage [email or web address] Password [facility] 11/01/2015, 6:37 PM   LOS: 3 days

## 2015-11-01 NOTE — Progress Notes (Signed)
3 Days Post-Op  Subjective: No complaints  Objective: Vital signs in last 24 hours: Temp:  [98.5 F (36.9 C)-98.8 F (37.1 C)] 98.5 F (36.9 C) (04/02 0448) Pulse Rate:  [50-66] 50 (04/02 0900) Resp:  [18] 18 (04/02 0448) BP: (119-130)/(66-74) 127/66 mmHg (04/02 0900) SpO2:  [96 %-100 %] 100 % (04/02 0448) Weight:  [122.834 kg (270 lb 12.8 oz)-123.651 kg (272 lb 9.6 oz)] 122.834 kg (270 lb 12.8 oz) (04/02 0704) Last BM Date: 10/30/15  Intake/Output from previous day: 04/01 0701 - 04/02 0700 In: 4570 [P.O.:480; I.V.:4090] Out: -  Intake/Output this shift:    Resp: clear to auscultation bilaterally Cardio: regular rate and rhythm GI: soft, non-tender; bowel sounds normal; no masses,  no organomegaly  Lab Results:   Recent Labs  10/31/15 0512 11/01/15 0505  WBC 3.8* 3.8*  HGB 7.9* 8.0*  HCT 23.6* 24.1*  PLT 146* 168   BMET  Recent Labs  10/29/15 1010 10/31/15 0512  NA 138 139  K 4.8 3.9  CL 112* 110  CO2 21* 23  GLUCOSE 136* 90  BUN 34* 11  CREATININE 0.77 0.73  CALCIUM 7.7* 7.6*   PT/INR  Recent Labs  10/29/15 1010  LABPROT 14.4  INR 1.10   ABG No results for input(s): PHART, HCO3 in the last 72 hours.  Invalid input(s): PCO2, PO2  Studies/Results: No results found.  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 Await path Recent MI and stent. Continue blood thinners. Will discuss with cardiology timing of safe surgery Oncology consult  LOS: 3 days    TOTH III,Mikki Ziff S 11/01/2015

## 2015-11-01 NOTE — Progress Notes (Signed)
RT NOTE:  Pt will put CPAP on when ready. CPAP within reach. Humidity chamber filled. Pt understands to call if RT needed.

## 2015-11-02 DIAGNOSIS — K921 Melena: Secondary | ICD-10-CM | POA: Insufficient documentation

## 2015-11-02 DIAGNOSIS — D62 Acute posthemorrhagic anemia: Secondary | ICD-10-CM

## 2015-11-02 DIAGNOSIS — C49A3 Gastrointestinal stromal tumor of small intestine: Secondary | ICD-10-CM

## 2015-11-02 DIAGNOSIS — R112 Nausea with vomiting, unspecified: Secondary | ICD-10-CM

## 2015-11-02 DIAGNOSIS — I2510 Atherosclerotic heart disease of native coronary artery without angina pectoris: Secondary | ICD-10-CM

## 2015-11-02 LAB — TYPE AND SCREEN
ABO/RH(D): O POS
Antibody Screen: NEGATIVE
UNIT DIVISION: 0
UNIT DIVISION: 0
Unit division: 0
Unit division: 0

## 2015-11-02 LAB — CBC
HEMATOCRIT: 23.4 % — AB (ref 39.0–52.0)
HEMOGLOBIN: 7.9 g/dL — AB (ref 13.0–17.0)
MCH: 28.1 pg (ref 26.0–34.0)
MCHC: 33.8 g/dL (ref 30.0–36.0)
MCV: 83.3 fL (ref 78.0–100.0)
Platelets: 180 10*3/uL (ref 150–400)
RBC: 2.81 MIL/uL — ABNORMAL LOW (ref 4.22–5.81)
RDW: 18.5 % — AB (ref 11.5–15.5)
WBC: 3.9 10*3/uL — AB (ref 4.0–10.5)

## 2015-11-02 LAB — GLUCOSE, CAPILLARY: Glucose-Capillary: 85 mg/dL (ref 65–99)

## 2015-11-02 MED ORDER — FUROSEMIDE 10 MG/ML IJ SOLN
20.0000 mg | Freq: Once | INTRAMUSCULAR | Status: AC
  Administered 2015-11-02: 20 mg via INTRAVENOUS
  Filled 2015-11-02: qty 2

## 2015-11-02 NOTE — Progress Notes (Signed)
[facility] Progress Note  Chief Complaint: GI bleeding/melena and gastric lesions  Subjective History:  I was asked by surgery to re-evaluate this patient with multiple gastric lesions and overt GI bleeding with anemia. Dr. Woodward Ku consult note, EGD report and pathology report reviewed. The patient has had no further overt GI bleeding since he completed 4 units PRBCs, and his Hgb has been stable since then. He denies abd pain, nausea or vomiting  ROS: Cardiovascular:  no chest pain Respiratory: no dyspnea  Objective:  Med list reviewed  Vital signs in last 24 hrs: Filed Vitals:   11/02/15 0446 11/02/15 1530  BP: 126/71 115/72  Pulse: 60 54  Temp: 98.4 F (36.9 C) 98.5 F (36.9 C)  Resp: 20 20    Physical Exam   HEENT: sclera anicteric, oral mucosa moist without lesions  Neck: supple, no thyromegaly, JVD or lymphadenopathy  Cardiac: RRR without murmurs, S1S2 heard, no peripheral edema  Pulm: clear to auscultation bilaterally, normal RR and effort noted  Abdomen: obese, soft, no tenderness, with active bowel sounds. No guarding or palpable hepatosplenomegaly  Skin; warm and dry, no jaundice or rash  Recent Labs:   Recent Labs Lab 10/31/15 0512 11/01/15 0505 11/02/15 0441  WBC 3.8* 3.8* 3.9*  HGB 7.9* 8.0* 7.9*  HCT 23.6* 24.1* 23.4*  PLT 146* 168 180    Recent Labs Lab 10/29/15 1010 10/31/15 0512  NA 138 139  K 4.8 3.9  CL 112* 110  CO2 21* 23  BUN 34* 11  ALBUMIN 2.8*  --   ALKPHOS 45  --   ALT 17  --   AST 19  --   GLUCOSE 136* 90    Recent Labs Lab 10/29/15 1010  INR 1.10   Pathology report: spindle cells and ulcer   @ASSESSMENTPLANBEGIN @ Assessment:  Melena from bleeding GI lesions - has stopped Multiple gastric nodules - these are the source of chronic anemia and the recent overt GI bleed.  Their nature is as yet unclear.  Gastric stromal tumor is possible, but spindle cells on mucosal bx are nonspecific, and multifocal  stromal lesions in the stomach would be very unusual. Anemia of acute on chronic GI blood loss.  Cardiology understandably wants to resume DAT as soon as possible.   However, we do not have a clear diagnosis for these lesions.  A single submucosal lesion (not amenable to endoscopic removal) would mean a wedge resection, but the dozens of lesions that appear to be present for the photos would mean a total gastrectomy - a morbid procedure even without recent MI.  Surgery is understandably questioning that.  Plan: If we cannot get a clear answer in the next 1-2 days from pathology's further tissue stains, then EUS may be necessary for deeper biopsy.   I have allowed the patient to eat for now, as his Hgb is stable and there are no scopes planned for tomorrow. We will communicate with pathology tomorrow, and I will speak with Dr Silverio Decamp if she is back at work tomorrow. Daily Hgb and Hct.  Transfuse per medicine and cardiology threshold. May need to give blood if undergoes EUS.   Nelida Meuse III Pager [phone] Mon-Fri 8a-5p [phone] after 5p, weekends, holidays

## 2015-11-02 NOTE — Progress Notes (Signed)
Patient ID: James Mullins, male   DOB: 1961/07/20, 55 y.o.   MRN: [id number]   Subjective: Frustrated that nothing is being done No chest pain  Objective: Filed Vitals:   11/01/15 1726 11/01/15 2152 11/01/15 2201 11/02/15 0446  BP: 129/71  117/68 126/71  Pulse: 67 72 64 60  Temp:   98.1 F (36.7 C) 98.4 F (36.9 C)  TempSrc:   Oral Oral  Resp:  18 20 20   Height:      Weight:    122.925 kg (271 lb)  SpO2:  96% 95% 100%   Weight change: -0.816 kg (-1 lb 12.8 oz)  Intake/Output Summary (Last 24 hours) at 11/02/15 1215 Last data filed at 11/02/15 0600  Gross per 24 hour  Intake   1860 ml  Output      2 ml  Net   1858 ml   +5.3 L    General: Alert, awake, oriented x3, in no acute distress Neck:  JVP is normal Heart: Regular rate and rhythm, without murmurs, rubs, gallops.  Lungs: Clear to auscultation.  No rales or wheezes. Exemities:  No edema.   Neuro: Grossly intact, nonfocal.   Lab Results: Results for orders placed or performed during the hospital encounter of 10/29/15 (from the past 24 hour(s))  CBC     Status: Abnormal   Collection Time: 11/02/15  4:41 AM  Result Value Ref Range   WBC 3.9 (L) 4.0 - 10.5 K/uL   RBC 2.81 (L) 4.22 - 5.81 MIL/uL   Hemoglobin 7.9 (L) 13.0 - 17.0 g/dL   HCT 23.4 (L) 39.0 - 52.0 %   MCV 83.3 78.0 - 100.0 fL   MCH 28.1 26.0 - 34.0 pg   MCHC 33.8 30.0 - 36.0 g/dL   RDW 18.5 (H) 11.5 - 15.5 %   Platelets 180 150 - 400 K/uL  Glucose, capillary     Status: None   Collection Time: 11/02/15  8:13 AM  Result Value Ref Range   Glucose-Capillary 85 65 - 99 mg/dL    Echo :  EF 45%   Telemetry :  NSR  11/02/2015      Current facility-administered medications:  .  0.9 %  sodium chloride infusion, , Intravenous, Continuous, Ivor Costa, MD, Last Rate: 50 mL/hr at 11/01/15 1725 .  acetaminophen (TYLENOL) tablet 650 mg, 650 mg, Oral, Q6H PRN **OR** acetaminophen (TYLENOL) suppository 650 mg, 650 mg, Rectal, Q6H PRN, Ivor Costa, MD .   aspirin chewable tablet 81 mg, 81 mg, Oral, Daily, Laban Emperor Zehr, PA-C, 81 mg at 11/01/15 0900 .  atorvastatin (LIPITOR) tablet 80 mg, 80 mg, Oral, q1800, Ivor Costa, MD, 80 mg at 11/01/15 1724 .  carvedilol (COREG) tablet 3.125 mg, 3.125 mg, Oral, BID WC, Ivor Costa, MD, 3.125 mg at 11/01/15 1724 .  dextromethorphan-guaiFENesin (MUCINEX DM) 30-600 MG per 12 hr tablet 1 tablet, 1 tablet, Oral, BID, Ivor Costa, MD, 1 tablet at 11/01/15 2157 .  multivitamin with minerals tablet 1 tablet, 1 tablet, Oral, Daily, Ivor Costa, MD, 1 tablet at 11/01/15 0900 .  nitroGLYCERIN (NITROSTAT) SL tablet 0.4 mg, 0.4 mg, Sublingual, Q5 min PRN, Ivor Costa, MD .  ondansetron [facility]) tablet 4 mg, 4 mg, Oral, Q6H PRN **OR** ondansetron (ZOFRAN) injection 4 mg, 4 mg, Intravenous, Q6H PRN, Ivor Costa, MD .  sodium chloride flush (NS) 0.9 % injection 3 mL, 3 mL, Intravenous, Q12H, Ivor Costa, MD, 3 mL at 10/31/15 2200   Plan:    CAD recent  stent to RCA:  2/17.  Fairly large 3.5 mm DES to RCA.  On 81 mg ASA resume Brillinta once team knows what plan is for cancer  CHF:  EF 35-40%  Over 5 liters positive will give a dose of lasix today consider adding ace before d/c  GI:  Path report with ulcer ? 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 Transfer to tertiary center. Need to expedite plans to DAT can be reistituted   Jenkins Rouge 11/02/2015, 12:15 PM

## 2015-11-02 NOTE — Progress Notes (Signed)
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 The patient complained of nausea and vomiting and diarrhea on 10/29/2015. He had some bright red blood and hematemesis. Altogether, he stated he vomited approximately 4-5 times. Of note, patient was recently hospitalized from 2/11 to 09/14/15 because of STEMI. He is s/p of drug-eluting stent placement and has been on ASA and Brilina. Per previous discharge summary, pt had occasional red blood mixed with stool that he assumed to be hemorrhoids. The patient's hemoglobin was noted to be 6.5 at the time of admission, below his baseline of 9. As result, the patient was admitted for further evaluation.  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 -advance diet if no imminent surgery CAD -recent STEMI with RCA DES--09/12/15 -appreciate cardiology consult--> continue ASA 81 mg, hold Brillinta -If it is felt that surgery will be on hold,restart DAPT -OK for surgery per cardiology  Ischemic cardiomyopathy/chronic  systolic and diastolic CHF -123456 echo EF 40-45 percent, grade 2 DD -Continue carvedilol and statin -daily weight--2 lb weight gain since hospitalization -+5L-->lasix IV x1 per cardiology  Acute blood loss anemia -Transfused 4 units PRBC total -Monitor hemodynamically -trend hemoglobin--stable after last 2 units PRBCs    Family Communication: Wife updated at beside 4/3 Disposition Plan: Home when medically stable--several days   Procedures/Studies: Dg Chest 2 View  10/29/2015  CLINICAL DATA:  Chest pain, shortness of breath. EXAM: CHEST  2 VIEW COMPARISON:  September 12, 2015. FINDINGS: The heart size and mediastinal contours are within normal limits. Both lungs are clear. No pneumothorax or pleural effusion is noted. The visualized skeletal structures are unremarkable. IMPRESSION: No active cardiopulmonary disease. Electronically Signed   By: Marijo Conception, M.D.   On: 10/29/2015 10:07   Ct Chest W Contrast  10/29/2015  CLINICAL DATA:  55 y.o. male with Gastric mass. 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 Patient reports that he started having nausea, vomiting, diarrhea at about 7 PM. He has mild abdominal discomfort. He vomited once with bright red blood and some blood clots, described as "good amount of blood". EXAM: CT CHEST, ABDOMEN, AND PELVIS WITH CONTRAST TECHNIQUE: Multidetector CT imaging of the chest, abdomen and pelvis was performed following the standard protocol during bolus administration of intravenous contrast. CONTRAST:  19mL ISOVUE-300 IOPAMIDOL (ISOVUE-300) INJECTION 61%, 8mL OMNIPAQUE IOHEXOL 300 MG/ML SOLN COMPARISON:  None. FINDINGS: CT CHEST Neck base and axilla: No mass or adenopathy. Visualized thyroid is unremarkable. Mediastinum and hila: Heart normal in size and  configuration. Great vessels normal in caliber. No atherosclerotic plaque. No mediastinal or hilar  masses or pathologically enlarged lymph nodes. Lungs and pleura: 3.8 mm right lower lobe nodule with an adjacent 2 mm nodule. Otherwise clear. No pleural effusion. No pneumothorax. CT ABDOMEN AND PELVIS Liver, spleen, gallbladder, pancreas, adrenal glands:  Normal. Kidneys, ureters, bladder:  Normal. Lymph nodes:  No pathologically enlarged lymph nodes. Ascites:  None. Gastrointestinal: 3.7 cm round smooth mass projects along the posterior wall of the mid stomach. No other stomach mass. Normal small bowel. Normal colon. Normal appendix. MUSCULOSKELETAL Mild degenerative changes along the thoracic spine. No osteoblastic or osteolytic lesions. IMPRESSION: 1. 3.7 cm gastric mass. This has smooth margins. 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 2. No evidence of locally invasive gastric malignancy or of metastatic disease. 3. No acute findings within the chest, abdomen or pelvis. Electronically Signed   By: Lajean Manes M.D.   On: 10/29/2015 20:49   Ct Abdomen Pelvis W Contrast  10/29/2015  CLINICAL DATA:  55 y.o. male with Gastric mass. 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 Patient reports that he started having nausea, vomiting, diarrhea at about 7 PM. He has mild abdominal discomfort. He vomited once with bright red blood and some blood clots, described as "good amount of blood". EXAM: CT CHEST, ABDOMEN, AND PELVIS WITH CONTRAST TECHNIQUE: Multidetector CT imaging of the chest, abdomen and pelvis was performed following the standard protocol during bolus administration of intravenous contrast. CONTRAST:  138mL ISOVUE-300 IOPAMIDOL (ISOVUE-300) INJECTION 61%, 42mL OMNIPAQUE IOHEXOL 300 MG/ML SOLN COMPARISON:  None. FINDINGS: CT CHEST  Neck base and axilla: No mass or adenopathy. Visualized thyroid is unremarkable. Mediastinum and hila: Heart normal in size and configuration. Great vessels normal in caliber. No atherosclerotic plaque. No mediastinal or hilar masses or pathologically enlarged lymph nodes. Lungs and pleura: 3.8 mm right lower lobe nodule with an adjacent 2 mm nodule. Otherwise clear. No pleural effusion. No pneumothorax. CT ABDOMEN AND PELVIS Liver, spleen, gallbladder, pancreas, adrenal glands:  Normal. Kidneys, ureters, bladder:  Normal. Lymph nodes:  No pathologically enlarged lymph nodes. Ascites:  None. Gastrointestinal: 3.7 cm round smooth mass projects along the posterior wall of the mid stomach. No other stomach mass. Normal small bowel. Normal colon. Normal appendix. MUSCULOSKELETAL Mild degenerative changes along the thoracic spine. No osteoblastic or osteolytic lesions. IMPRESSION: 1. 3.7 cm gastric mass. This has smooth margins. 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 2. No evidence of locally invasive gastric malignancy or of metastatic disease. 3. No acute findings within the chest, abdomen or pelvis. Electronically Signed   By: Lajean Manes M.D.   On: 10/29/2015 20:49         Subjective: Patient denies fevers, chills, headache, chest pain, dyspnea, nausea, vomiting, diarrhea, abdominal pain, dysuria, hematuria   Objective: Filed Vitals:   11/01/15 2152 11/01/15 2201 11/02/15 0446 11/02/15 1530  BP:  117/68 126/71 115/72  Pulse: 72 64 60 54  Temp:  98.1 F (36.7 C) 98.4 F (36.9 C) 98.5 F (36.9 C)  TempSrc:  Oral Oral Oral  Resp: 18 20 20 20   Height:      Weight:   122.925 kg (271 lb)   SpO2: 96% 95% 100% 99%    Intake/Output Summary (Last 24 hours) at 11/02/15 2006 Last data filed at 11/02/15 1850  Gross per 24 hour  Intake 1551.67 ml  Output      4 ml  Net 1547.67 ml   Weight change: -0.816 kg (-1 lb 12.8 oz) Exam:   General:  Pt is alert, follows commands appropriately, not in acute  distress  HEENT: No icterus, No thrush, No neck mass, [place]/AT  Cardiovascular: RRR, S1/S2, no rubs, no gallops  Respiratory: CTA bilaterally, no wheezing, no crackles, no rhonchi  Abdomen: Soft/+BS, non tender, non distended, no guarding  Extremities: No edema, No lymphangitis, No petechiae, No rashes, no synovitis  Data Reviewed: Basic Metabolic Panel:  Recent Labs Lab 10/29/15 1010 10/31/15 0512  NA 138 139  K 4.8 3.9  CL 112* 110  CO2 21* 23  GLUCOSE 136* 90  BUN 34* 11  CREATININE 0.77 0.73  CALCIUM 7.7* 7.6*   Liver Function Tests:  Recent Labs Lab 10/29/15 1010  AST 19  ALT 17  ALKPHOS 45  BILITOT 0.2*  PROT 5.0*  ALBUMIN 2.8*   No results for input(s): LIPASE, AMYLASE in the last 168 hours. No results for input(s): AMMONIA in the last 168 hours. CBC:  Recent Labs Lab 10/29/15 1010 10/30/15 0034 10/30/15 0807 10/31/15 0512 11/01/15 0505 11/02/15 0441  WBC 8.7 6.2 4.9 3.8* 3.8* 3.9*  NEUTROABS 6.7  --   --   --   --   --   HGB 6.5* 6.8* 6.4* 7.9* 8.0* 7.9*  HCT 20.2* 20.3* 19.6* 23.6* 24.1* 23.4*  MCV 82.1 83.9 83.4 84.0 84.3 83.3  PLT 240 164 161 146* 168 180   Cardiac Enzymes: No results for input(s): CKTOTAL, CKMB, CKMBINDEX, TROPONINI in the last 168 hours. BNP: Invalid input(s): POCBNP CBG:  Recent Labs Lab 10/30/15 0745 10/31/15 0751 11/01/15 0735 11/02/15 0813  GLUCAP 96 79 77 85    No results found for this or any previous visit (from the past 240 hour(s)).   Scheduled Meds: . aspirin  81 mg Oral Daily  . atorvastatin  80 mg Oral q1800  . carvedilol  3.125 mg Oral BID WC  . dextromethorphan-guaiFENesin  1 tablet Oral BID  . multivitamin with minerals  1 tablet Oral Daily  . sodium chloride flush  3 mL Intravenous Q12H   Continuous Infusions: . sodium chloride 50 mL/hr at 11/02/15 1516     Seeley Southgate, DO  [facility] Pager [phone]  If 7PM-7AM, please contact night-coverage [email or web address] Password  [email or web address] 11/02/2015, 8:06 PM   LOS: 4 days

## 2015-11-02 NOTE — Consult Note (Signed)
Marland Kitchen    HEMATOLOGY/ONCOLOGY CONSULTATION NOTE  Date of Service: 11/02/2015  Patient Care Team: No Pcp Per Patient as PCP - General (General Practice)  CHIEF COMPLAINTS/PURPOSE OF CONSULTATION:  ?gastric GIST tumor  HISTORY OF PRESENTING ILLNESS:   James Mullins is a wonderful 55 y.o. 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  Patient is a history of coronary artery disease status post STEMI with DES placed to RCA on 09/12/2015 [was on aspirin and Brillinta], hyperlipidemia, sleep apnea on CPAP, obesity, combined systolic and diastolic heart failure with an ejection fraction of 40-45%.  Patient presented to the hospital on 3/30/2017With nausea vomiting and bloody diarrhea followed by  Hematemesis.In the emergency room the patient was noted to have a hemoglobin dropped to 6.5 from a hemoglobin of 9 on 09/21/2015.  Patient subsequently has received 4 units of PRBC.  He has been taken off his Brillinta since admission on 10/29/2015 and continues to be on aspirin at this time.  Patient had an EGD on 10/29/2015 that showed multiple large gastric nodules which appear to be subepithelial in the stomach extending from the GE junction to 10-12 cm with multiple ulcerations and mild oozing of blood. Biopsies were done which showed no adenomatous changes or carcinoma.  Ulcer with associated spindle cell proliferation. Tthis is thought to be fibroplasia related ulceration or the surface of a spindle cell lesion in the submucosa.  Patient had a CT of the chest abdomen pelvis which showed a 3.7 cm gastric mass with smooth margins along the posterior wall of the mid stomach.  No evidence of locally invasive gastric malignancy or metastatic disease.  Patient is being followed by cardiology.  Surgery was consulted - they think the required surgery would be quite extensive and morbid and that from the  surgery/anesthesia standpoint given his recent STEMI and DES stenting he would be high risk for cardiac decompensation with surgery.   Oncology was consulted today to weigh in regarding the treatment of his condition.  Patient's hemoglobin has been stable last 2-3 days.  Reports no additional hematemesis or melena at this time.  No significant abdominal pain.  No chest pain.  No overt shortness of breath.  Patient's son is at bedside.   MEDICAL HISTORY:  Past Medical History  Diagnosis Date  . Obesity (BMI 30.0-34.9) 2015  . OSA on CPAP 08/2013    snoring. sleep study confirmed.  Dr Asencion Partridge Dohmeier  . CAD (coronary artery disease) 09/2015  . MI (myocardial infarction) ([place]) 09/2015  . Chronic combined systolic and diastolic CHF (congestive heart failure) ([place]) 09/2015    ef 40 to 45%,  grade 2 diastolic dysfunction.   . Iron deficiency anemia 09/2015  . Upper GI bleed 10/29/15    EGD: multiple subepithelial gastric nodules with overlying multiple, oozing ulcers.  GIST vs gastric varices.     SURGICAL HISTORY: Past Surgical History  Procedure Laterality Date  . Cardiac catheterization N/A 09/12/2015    Left Heart Cath and Coronary Angiography; Dr Peter M Martinique, MD;   . Cardiac catheterization N/A 09/12/2015    placement Promus stent to mid RCA, Dr Peter M Martinique, MD  . Esophagogastroduodenoscopy N/A 10/29/2015    Procedure: ESOPHAGOGASTRODUODENOSCOPY (EGD);  Surgeon: Mauri Pole, MD;  Location: [facility];  Service: Endoscopy;  Laterality: N/A;    SOCIAL HISTORY: Social History   Social History  . Marital Status: Married  Spouse Name: renee  . Number of Children: 1  . Years of Education: college   Occupational History  . 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    Social History Main Topics  . Smoking status: Never Smoker   . Smokeless tobacco: Current User    Types: Chew  . Alcohol Use: Yes     Comment: couple of 6 packs on  the weekends  . Drug Use: No  . Sexual Activity: Not on file   Other Topics Concern  . Not on file   Social History Narrative    FAMILY HISTORY: Family History  Problem Relation Age of Onset  . Stroke Father   . Colon cancer Mother     ALLERGIES:  has No Known Allergies.  MEDICATIONS:  Current Facility-Administered Medications  Medication Dose Route Frequency Provider Last Rate Last Dose  . 0.9 %  sodium chloride infusion   Intravenous Continuous Ivor Costa, MD 50 mL/hr at 11/02/15 1516    . acetaminophen (TYLENOL) tablet 650 mg  650 mg Oral Q6H PRN Ivor Costa, MD       Or  . acetaminophen (TYLENOL) suppository 650 mg  650 mg Rectal Q6H PRN Ivor Costa, MD      . aspirin chewable tablet 81 mg  81 mg Oral Daily Laban Emperor Zehr, PA-C   81 mg at 11/02/15 1515  . atorvastatin (LIPITOR) tablet 80 mg  80 mg Oral q1800 Ivor Costa, MD   80 mg at 11/01/15 1724  . carvedilol (COREG) tablet 3.125 mg  3.125 mg Oral BID WC Ivor Costa, MD   3.125 mg at 11/01/15 1724  . dextromethorphan-guaiFENesin (MUCINEX DM) 30-600 MG per 12 hr tablet 1 tablet  1 tablet Oral BID Ivor Costa, MD   1 tablet at 11/02/15 1253  . multivitamin with minerals tablet 1 tablet  1 tablet Oral Daily Ivor Costa, MD   1 tablet at 11/01/15 0900  . nitroGLYCERIN (NITROSTAT) SL tablet 0.4 mg  0.4 mg Sublingual Q5 min PRN Ivor Costa, MD      . ondansetron [facility]) tablet 4 mg  4 mg Oral Q6H PRN Ivor Costa, MD       Or  . ondansetron [facility]) injection 4 mg  4 mg Intravenous Q6H PRN Ivor Costa, MD      . sodium chloride flush (NS) 0.9 % injection 3 mL  3 mL Intravenous Q12H Ivor Costa, MD   3 mL at 10/31/15 2200    REVIEW OF SYSTEMS:    10 Point review of Systems was done is negative except as noted above.  PHYSICAL EXAMINATION: ECOG PERFORMANCE STATUS: 1 - Symptomatic but completely ambulatory  . Filed Vitals:   11/02/15 0446 11/02/15 1530  BP: 126/71 115/72  Pulse: 60 54  Temp: 98.4 F (36.9 C) 98.5 F (36.9 C)  Resp: 20  20   Filed Weights   10/31/15 1643 11/01/15 0704 11/02/15 0446  Weight: 272 lb 9.6 oz (123.651 kg) 270 lb 12.8 oz (122.834 kg) 271 lb (122.925 kg)   .Body mass index is 40 kg/(m^2).  GENERAL:alert, in no acute distress and comfortable SKIN: skin color, texture, turgor are normal, no rashes or significant lesions EYES: normal, conjunctiva are pink and non-injected, sclera clear OROPHARYNX:no exudate, no erythema and lips, buccal mucosa, and tongue normal  NECK: supple, no JVD, thyroid normal size, non-tender, without nodularity LYMPH:  no palpable lymphadenopathy in the cervical, axillary or inguinal LUNGS:  clear to auscultation with normal respiratory effort HEART: regular rate & rhythm,  no murmurs and no lower extremity edema ABDOMEN: abdomen soft, non-tender, normoactive bowel sounds  Musculoskeletal: no cyanosis of digits and no clubbing  PSYCH: alert & oriented x 3 with fluent speech NEURO: no focal motor/sensory deficits  LABORATORY DATA:  I have reviewed the data as listed  . CBC Latest Ref Rng 11/02/2015 11/01/2015 10/31/2015  WBC 4.0 - 10.5 K/uL 3.9(L) 3.8(L) 3.8(L)  Hemoglobin 13.0 - 17.0 g/dL 7.9(L) 8.0(L) 7.9(L)  Hematocrit 39.0 - 52.0 % 23.4(L) 24.1(L) 23.6(L)  Platelets 150 - 400 K/uL 180 168 146(L)    . CMP Latest Ref Rng 10/31/2015 10/29/2015 09/14/2015  Glucose 65 - 99 mg/dL 90 136(H) 102(H)  BUN 6 - 20 mg/dL 11 34(H) 10  Creatinine 0.61 - 1.24 mg/dL 0.73 0.77 0.85  Sodium 135 - 145 mmol/L 139 138 141  Potassium 3.5 - 5.1 mmol/L 3.9 4.8 3.9  Chloride 101 - 111 mmol/L 110 112(H) 108  CO2 22 - 32 mmol/L 23 21(L) 21(L)  Calcium 8.9 - 10.3 mg/dL 7.6(L) 7.7(L) 8.5(L)  Total Protein 6.5 - 8.1 g/dL - 5.0(L) -  Total Bilirubin 0.3 - 1.2 mg/dL - 0.2(L) -  Alkaline Phos 38 - 126 U/L - 45 -  AST 15 - 41 U/L - 19 -  ALT 17 - 63 U/L - 17 -        RADIOGRAPHIC STUDIES: I have personally reviewed the radiological images as listed and agreed with the findings in the  report. Dg Chest 2 View  10/29/2015  CLINICAL DATA:  Chest pain, shortness of breath. EXAM: CHEST  2 VIEW COMPARISON:  September 12, 2015. FINDINGS: The heart size and mediastinal contours are within normal limits. Both lungs are clear. No pneumothorax or pleural effusion is noted. The visualized skeletal structures are unremarkable. IMPRESSION: No active cardiopulmonary disease. Electronically Signed   By: Marijo Conception, M.D.   On: 10/29/2015 10:07   Ct Chest W Contrast  10/29/2015  CLINICAL DATA:  55 y.o. male with Gastric mass. 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 Patient reports that he started having nausea, vomiting, diarrhea at about 7 PM. He has mild abdominal discomfort. He vomited once with bright red blood and some blood clots, described as "good amount of blood". EXAM: CT CHEST, ABDOMEN, AND PELVIS WITH CONTRAST TECHNIQUE: Multidetector CT imaging of the chest, abdomen and pelvis was performed following the standard protocol during bolus administration of intravenous contrast. CONTRAST:  138m ISOVUE-300 IOPAMIDOL (ISOVUE-300) INJECTION 61%, 582mOMNIPAQUE IOHEXOL 300 MG/ML SOLN COMPARISON:  None. FINDINGS: CT CHEST Neck base and axilla: No mass or adenopathy. Visualized thyroid is unremarkable. Mediastinum and hila: Heart normal in size and configuration. Great vessels normal in caliber. No atherosclerotic plaque. No mediastinal or hilar masses or pathologically enlarged lymph nodes. Lungs and pleura: 3.8 mm right lower lobe nodule with an adjacent 2 mm nodule. Otherwise clear. No pleural effusion. No pneumothorax. CT ABDOMEN AND PELVIS Liver, spleen, gallbladder, pancreas, adrenal glands:  Normal. Kidneys, ureters, bladder:  Normal. Lymph nodes:  No pathologically enlarged lymph nodes. Ascites:  None.  Gastrointestinal: 3.7 cm round smooth mass projects along the posterior wall of the mid stomach. No other stomach mass. Normal small bowel. Normal colon. Normal appendix. MUSCULOSKELETAL Mild degenerative changes along the thoracic spine.  No osteoblastic or osteolytic lesions. IMPRESSION: 1. 3.7 cm gastric mass. This has smooth margins. 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 2. No evidence of locally invasive gastric malignancy or of metastatic disease. 3. No acute findings within the chest, abdomen or pelvis. Electronically Signed   By: Lajean Manes M.D.   On: 10/29/2015 20:49   Ct Abdomen Pelvis W Contrast  10/29/2015  CLINICAL DATA:  55 y.o. male with Gastric mass. 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 Patient reports that he started having nausea, vomiting, diarrhea at about 7 PM. He has mild abdominal discomfort. He vomited once with bright red blood and some blood clots, described as "good amount of blood". EXAM: CT CHEST, ABDOMEN, AND PELVIS WITH CONTRAST TECHNIQUE: Multidetector CT imaging of the chest, abdomen and pelvis was performed following the standard protocol during bolus administration of intravenous contrast. CONTRAST:  147m ISOVUE-300 IOPAMIDOL (ISOVUE-300) INJECTION 61%, 535mOMNIPAQUE IOHEXOL 300 MG/ML SOLN COMPARISON:  None. FINDINGS: CT CHEST Neck base and axilla: No mass or adenopathy. Visualized thyroid is unremarkable. Mediastinum and hila: Heart normal in size and configuration. Great vessels normal in caliber. No atherosclerotic plaque. No mediastinal or hilar masses or pathologically enlarged lymph nodes. Lungs and pleura: 3.8 mm right lower lobe nodule with an adjacent 2 mm nodule. Otherwise clear. No pleural effusion. No pneumothorax. CT ABDOMEN AND PELVIS Liver, spleen, gallbladder, pancreas,  adrenal glands:  Normal. Kidneys, ureters, bladder:  Normal. Lymph nodes:  No pathologically enlarged lymph nodes. Ascites:  None. Gastrointestinal: 3.7 cm round smooth mass projects along the posterior wall of the mid stomach. No other stomach mass. Normal small bowel. Normal colon. Normal appendix. MUSCULOSKELETAL Mild degenerative changes along the thoracic spine. No osteoblastic or osteolytic lesions. IMPRESSION: 1. 3.7 cm gastric mass. This has smooth margins. 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 2. No evidence of locally invasive gastric malignancy or of metastatic disease. 3. No acute findings within the chest, abdomen or pelvis. Electronically Signed   By: DaLajean Manes.D.   On: 10/29/2015 20:49   EGD 10/29/2015: Multiple large gastric nodules with multiple ulcers differential includes GIST vs gastric Varices specimens collected.  Coronary Stent Intervention    Left Heart Cath and Coronary Angiography    Conclusion     There is moderate to severe left ventricular systolic dysfunction.  Mid RCA lesion, 90% stenosed. Post intervention, there is a 0% residual stenosis.  1. Single vessel obstructive CAD 2. Moderate to severe LV dysfunction. Out of proportion to CAD 3. Successful stenting of the mid RCA with a DES  Plan: DAPT for one year. Risk factor modification. Beta blocker and ACEi as tolerated for LV dysfunction.    ECHO 09/14/2015: Study Conclusions  - Left ventricle: The cavity size was mildly dilated. Systolic  function was mildly to moderately reduced. The estimated ejection  fraction was in the range of 40% to 45%. There is akinesis of the  basalinferior myocardium. Features are consistent with a  pseudonormal left ventricular filling pattern, with concomitant  abnormal relaxation and increased filling pressure (grade 2  diastolic dysfunction). - Mitral valve: There was mild regurgitation. - Left atrium: The atrium was moderately to severely dilated. - Tricuspid valve:  There was trivial regurgitation. - Pulmonary arteries: PA peak pressure: 31 mm Hg (S).   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   Could be multifocal based on endoscopic evaluation. Pathology was not definitive for GIST based on sampling. Likely inadequate tissue for KIT and PDGFRA mutation testing. ?mitotic index.  #2 acute GI bleeding due to gastric ulcerations likely related to gastric GIST  #3 recent STEMI status post DES to RCA on 09/12/2015. Patient was on aspirin and Brillinta.  Currently off Brillinta due to GI bleeding.  #4 acute blood loss anemia related to upper GI bleeding  #5Obstructive sleep apnea on CPAP  #6 hypertension  #7 dyslipidemia  Plan  -Patient certainly requires a definitive pathologic diagnosis including mutation testing if this is a gastric GIST which is the primary concern. Might need to consider rpt EGD and additional biopsy EUS guided if needed. -Based on available data the tumor appears to be resectable with negative margins however as per surgery input  -subtotal, or total gastrectomy with an esophageal to distal stomach or jejunal anastomosis with significant risk of blood loss and potential need for holding any type of anti-platelet drugs for a prolonged period of time. -patient is also high risk for MI if he needs to get off antiplatelet therapy for surgery given he recently had a STEMI with drug-eluting stent placement of the RCA and has systolic CHF. -acute ongoing gastrointestinal bleeding might warrant consideration of IR consultation to consider arterial embolization of feeding blood vessels. -If the surgeons take the option of surgery off the table and we have a definitive tissue diagnosis of Gist we might be able to offer the patient treatment with Imatinib to keep the tumor under control while the patient atleast completes 6 months of dual anti-platelet therapy and then  re-assess candidacy for surgery. This option would potentially be more viable if the patient has a known favorable KIT mutation. -risks involved with imatinib in this setting include issues with fluid retention and volume overload causing CHF decompensation, increased risk of bleeding due to thrombocytopenia.  Other cytopenias or anemia.  Medication induced chest pain.  Among other things.  -Transfuse as needed to maintain hemoglobin closer to 9 in the setting of recent STEMI and known coronary artery disease and a risk profile suggesting high risk of recurrent bleeding. -We'll continue to follow to help make treatment decisions.  All of the patients questions were answered to his apparent satisfaction. The patient knows to call the clinic with any problems, questions or concerns.  I spent 80 minutes counseling the patient face to face. The total time spent in the appointment was 80 minutes and more than 50% was on counseling and direct patient cares.    Sullivan Lone MD [place] AAHIVMS [facility] Hematology/Oncology Physician [facility]  (Office):       [phone] (Work cell):  [phone] (Fax):           [phone]  11/02/2015 4:26 PM

## 2015-11-02 NOTE — Progress Notes (Signed)
Patient ID: James Mullins, male   DOB: 1960/10/06, 55 y.o.   MRN: [id number]     [address] [place], [place] [id number]    Phone: [phone] FAX: [phone]     Subjective: No bloody BMs.  BM yesterday.  No n./v.  Remains NPO.  H&h stable at 7.9/23.4  Objective:  Vital signs:  Filed Vitals:   11/01/15 1726 11/01/15 2152 11/01/15 2201 11/02/15 0446  BP: 129/71  117/68 126/71  Pulse: 67 72 64 60  Temp:   98.1 F (36.7 C) 98.4 F (36.9 C)  TempSrc:   Oral Oral  Resp:  18 20 20   Height:      Weight:    122.925 kg (271 lb)  SpO2:  96% 95% 100%    Last BM Date: 10/30/15  Intake/Output   Yesterday:  04/02 0701 - 04/03 0700 In: 1860 [P.O.:600; I.V.:1260] Out: 3 [Urine:3] This shift:    I/O last 3 completed shifts: In: 2830 [P.O.:1080; I.V.:1750] Out: 3 [Urine:3]    Physical Exam: General: Pt awake/alert/oriented x4 in no acute distress  Abdomen: Soft.  Nondistended.  Non tender.  No evidence of peritonitis.  No incarcerated hernias.    Problem List:   Principal Problem:   GI bleed Active Problems:   Obesity (BMI 30.0-34.9)   ST elevation (STEMI) myocardial infarction involving right coronary artery (HCC)   OSA (obstructive sleep apnea)   Anemia   Chronic combined systolic and diastolic CHF (congestive heart failure) (HCC)   Cough   Acute blood loss anemia   Upper GI bleed   Gastric mass   Bleeding gastrointestinal    Results:   Labs: Results for orders placed or performed during the hospital encounter of 10/29/15 (from the past 48 hour(s))  CBC     Status: Abnormal   Collection Time: 11/01/15  5:05 AM  Result Value Ref Range   WBC 3.8 (L) 4.0 - 10.5 K/uL   RBC 2.86 (L) 4.22 - 5.81 MIL/uL   Hemoglobin 8.0 (L) 13.0 - 17.0 g/dL   HCT 24.1 (L) 39.0 - 52.0 %   MCV 84.3 78.0 - 100.0 fL   MCH 28.0 26.0 - 34.0 pg   MCHC 33.2 30.0 - 36.0 g/dL   RDW 18.9 (H) 11.5 - 15.5 %    Platelets 168 150 - 400 K/uL  Glucose, capillary     Status: None   Collection Time: 11/01/15  7:35 AM  Result Value Ref Range   Glucose-Capillary 77 65 - 99 mg/dL   Comment 1 Notify RN    Comment 2 Document in Chart   CBC     Status: Abnormal   Collection Time: 11/02/15  4:41 AM  Result Value Ref Range   WBC 3.9 (L) 4.0 - 10.5 K/uL   RBC 2.81 (L) 4.22 - 5.81 MIL/uL   Hemoglobin 7.9 (L) 13.0 - 17.0 g/dL   HCT 23.4 (L) 39.0 - 52.0 %   MCV 83.3 78.0 - 100.0 fL   MCH 28.1 26.0 - 34.0 pg   MCHC 33.8 30.0 - 36.0 g/dL   RDW 18.5 (H) 11.5 - 15.5 %   Platelets 180 150 - 400 K/uL  Glucose, capillary     Status: None   Collection Time: 11/02/15  8:13 AM  Result Value Ref Range   Glucose-Capillary 85 65 - 99 mg/dL    Imaging / Studies: No results found.  Medications / Allergies:  Scheduled Meds: . aspirin  81 mg Oral Daily  . atorvastatin  80 mg Oral q1800  . carvedilol  3.125 mg Oral BID WC  . dextromethorphan-guaiFENesin  1 tablet Oral BID  . multivitamin with minerals  1 tablet Oral Daily  . sodium chloride flush  3 mL Intravenous Q12H   Continuous Infusions: . sodium chloride 50 mL/hr at 11/01/15 1725   PRN Meds:.acetaminophen **OR** acetaminophen, nitroGLYCERIN, ondansetron **OR** ondansetron (ZOFRAN) IV  Antibiotics: Anti-infectives    None        Assessment/Plan ABLA-total 4 units pRBCs Upper GI bleeding Gastric mass Pathology reveals ulcer with spindle cell proliferation.  Dr. Zella Richer spoke with anesthesiology who feels the patient is at a very high risk for surgery at this point given recent stent.  Surgical recommendations would be to postpone surgery, perhaps start Gleevac to "buy [place] some time."  I have therefore consulted Dr. Julien Nordmann for recommendations.   Stable.  No evidence of active bleeding.   CAD/NSTEMI-stent February 2017. On ASA.  Brilinta last dose 1140 3/31  Erby Pian, [facility] Surgery Pager  [phone](7A-4:30P)   11/02/2015 9:34 AM

## 2015-11-02 NOTE — Progress Notes (Signed)
Pt is sleeping comfortably at this time. Pt is on his home unit CPAP on arrival. No distress noted.

## 2015-11-03 DIAGNOSIS — K254 Chronic or unspecified gastric ulcer with hemorrhage: Secondary | ICD-10-CM

## 2015-11-03 LAB — CBC
HEMATOCRIT: 26 % — AB (ref 39.0–52.0)
Hemoglobin: 8.4 g/dL — ABNORMAL LOW (ref 13.0–17.0)
MCH: 27.6 pg (ref 26.0–34.0)
MCHC: 32.3 g/dL (ref 30.0–36.0)
MCV: 85.5 fL (ref 78.0–100.0)
Platelets: 220 10*3/uL (ref 150–400)
RBC: 3.04 MIL/uL — AB (ref 4.22–5.81)
RDW: 18.3 % — AB (ref 11.5–15.5)
WBC: 4.6 10*3/uL (ref 4.0–10.5)

## 2015-11-03 LAB — BASIC METABOLIC PANEL
ANION GAP: 6 (ref 5–15)
BUN: 8 mg/dL (ref 6–20)
CHLORIDE: 111 mmol/L (ref 101–111)
CO2: 25 mmol/L (ref 22–32)
Calcium: 7.9 mg/dL — ABNORMAL LOW (ref 8.9–10.3)
Creatinine, Ser: 0.88 mg/dL (ref 0.61–1.24)
GFR calc Af Amer: >60 mL/min (ref >60)
GFR calc non Af Amer: >60 mL/min (ref >60)
GLUCOSE: 110 mg/dL — AB (ref 65–99)
POTASSIUM: 4.1 mmol/L (ref 3.5–5.1)
Sodium: 142 mmol/L (ref 135–145)

## 2015-11-03 LAB — GLUCOSE, CAPILLARY: Glucose-Capillary: 93 mg/dL (ref 65–99)

## 2015-11-03 LAB — MAGNESIUM: Magnesium: 1.9 mg/dL (ref 1.7–2.4)

## 2015-11-03 MED ORDER — PANTOPRAZOLE SODIUM 40 MG PO TBEC
40.0000 mg | DELAYED_RELEASE_TABLET | Freq: Two times a day (BID) | ORAL | Status: DC
  Administered 2015-11-04 – 2015-11-06 (×5): 40 mg via ORAL
  Filled 2015-11-03 (×5): qty 1

## 2015-11-03 NOTE — Progress Notes (Signed)
Patient Name: James Mullins Date of Encounter: 11/03/2015     Principal Problem:   GI bleed Active Problems:   Obesity (BMI 30.0-34.9)   ST elevation (STEMI) myocardial infarction involving right coronary artery (HCC)   OSA (obstructive sleep apnea)   Anemia   Chronic combined systolic and diastolic CHF (congestive heart failure) (HCC)   Cough   Acute blood loss anemia   Upper GI bleed   Gastric mass   Bleeding gastrointestinal   Melena    SUBJECTIVE  No complaints at all. Just waiting for a decision to be made. No CP or SOB.   CURRENT MEDS . aspirin  81 mg Oral Daily  . atorvastatin  80 mg Oral q1800  . carvedilol  3.125 mg Oral BID WC  . dextromethorphan-guaiFENesin  1 tablet Oral BID  . multivitamin with minerals  1 tablet Oral Daily  . sodium chloride flush  3 mL Intravenous Q12H    OBJECTIVE  Filed Vitals:   11/02/15 0446 11/02/15 1530 11/02/15 2108 11/03/15 0506  BP: 126/71 115/72 94/57 107/67  Pulse: 60 54 67 56  Temp: 98.4 F (36.9 C) 98.5 F (36.9 C) 98.3 F (36.8 C) 97.9 F (36.6 C)  TempSrc: Oral Oral Oral Oral  Resp: 20 20 18 18   Height:      Weight: 271 lb (122.925 kg)   268 lb 8 oz (121.791 kg)  SpO2: 100% 99% 99% 98%    Intake/Output Summary (Last 24 hours) at 11/03/15 0845 Last data filed at 11/03/15 0841  Gross per 24 hour  Intake   2040 ml  Output      4 ml  Net   2036 ml   Filed Weights   11/01/15 0704 11/02/15 0446 11/03/15 0506  Weight: 270 lb 12.8 oz (122.834 kg) 271 lb (122.925 kg) 268 lb 8 oz (121.791 kg)    PHYSICAL EXAM  General: Pleasant, NAD. obese Neuro: Alert and oriented X 3. Moves all extremities spontaneously. Psych: Normal affect. HEENT:  Normal  Neck: Supple without bruits or JVD. Lungs:  Resp regular and unlabored, CTA. Heart: RRR no s3, s4, or murmurs. Abdomen: Soft, non-tender, non-distended, BS + x 4.  Extremities: No clubbing, cyanosis or edema. DP/PT/Radials 2+ and equal  bilaterally.  Accessory Clinical Findings  CBC  Recent Labs  11/02/15 0441 11/03/15 0453  WBC 3.9* 4.6  HGB 7.9* 8.4*  HCT 23.4* 26.0*  MCV 83.3 85.5  PLT 180 [id number]   Basic Metabolic Panel  Recent Labs  11/03/15 0453  NA 142  K 4.1  CL 111  CO2 25  GLUCOSE 110*  BUN 8  CREATININE 0.88  CALCIUM 7.9*  MG 1.9    TELE  NSR with some PACs and PVCs. Some sinus brady overnight.   Radiology/Studies  Dg Chest 2 View  10/29/2015  CLINICAL DATA:  Chest pain, shortness of breath. EXAM: CHEST  2 VIEW COMPARISON:  September 12, 2015. FINDINGS: The heart size and mediastinal contours are within normal limits. Both lungs are clear. No pneumothorax or pleural effusion is noted. The visualized skeletal structures are unremarkable. IMPRESSION: No active cardiopulmonary disease. Electronically Signed   By: Marijo Conception, M.D.   On: 10/29/2015 10:07   Ct Chest W Contrast  10/29/2015  CLINICAL DATA:  55 y.o. male with Gastric mass. Hx of PMH of CAD, recent STEMI, hyperlipidemia, OSA on CPAP, obesity, combined systolic and diastolic congestive heart failure with EF of 40-45 percent, GI bleeding, who presents with nausea,  vomiting, diarrhea, hematemesis and cough. Patient reports that he started having nausea, vomiting, diarrhea at about 7 PM. He has mild abdominal discomfort. He vomited once with bright red blood and some blood clots, described as "good amount of blood". EXAM: CT CHEST, ABDOMEN, AND PELVIS WITH CONTRAST TECHNIQUE: Multidetector CT imaging of the chest, abdomen and pelvis was performed following the standard protocol during bolus administration of intravenous contrast. CONTRAST:  133mL ISOVUE-300 IOPAMIDOL (ISOVUE-300) INJECTION 61%, 70mL OMNIPAQUE IOHEXOL 300 MG/ML SOLN COMPARISON:  None. FINDINGS: CT CHEST Neck base and axilla: No mass or adenopathy. Visualized thyroid is unremarkable. Mediastinum and hila: Heart normal in size and configuration. Great vessels normal in caliber.  No atherosclerotic plaque. No mediastinal or hilar masses or pathologically enlarged lymph nodes. Lungs and pleura: 3.8 mm right lower lobe nodule with an adjacent 2 mm nodule. Otherwise clear. No pleural effusion. No pneumothorax. CT ABDOMEN AND PELVIS Liver, spleen, gallbladder, pancreas, adrenal glands:  Normal. Kidneys, ureters, bladder:  Normal. Lymph nodes:  No pathologically enlarged lymph nodes. Ascites:  None. Gastrointestinal: 3.7 cm round smooth mass projects along the posterior wall of the mid stomach. No other stomach mass. Normal small bowel. Normal colon. Normal appendix. MUSCULOSKELETAL Mild degenerative changes along the thoracic spine. No osteoblastic or osteolytic lesions. IMPRESSION: 1. 3.7 cm gastric mass. This has smooth margins. 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 2. No evidence of locally invasive gastric malignancy or of metastatic disease. 3. No acute findings within the chest, abdomen or pelvis. Electronically Signed   By: Lajean Manes M.D.   On: 10/29/2015 20:49   Ct Abdomen Pelvis W Contrast  10/29/2015  CLINICAL DATA:  55 y.o. male with Gastric mass. 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 Patient reports that he started having nausea, vomiting, diarrhea at about 7 PM. He has mild abdominal discomfort. He vomited once with bright red blood and some blood clots, described as "good amount of blood". EXAM: CT CHEST, ABDOMEN, AND PELVIS WITH CONTRAST TECHNIQUE: Multidetector CT imaging of the chest, abdomen and pelvis was performed following the standard protocol during bolus administration of intravenous contrast. CONTRAST:  187mL ISOVUE-300 IOPAMIDOL (ISOVUE-300) INJECTION 61%, 25mL OMNIPAQUE IOHEXOL 300 MG/ML SOLN COMPARISON:  None. FINDINGS: CT CHEST Neck base and axilla: No mass or adenopathy.  Visualized thyroid is unremarkable. Mediastinum and hila: Heart normal in size and configuration. Great vessels normal in caliber. No atherosclerotic plaque. No mediastinal or hilar masses or pathologically enlarged lymph nodes. Lungs and pleura: 3.8 mm right lower lobe nodule with an adjacent 2 mm nodule. Otherwise clear. No pleural effusion. No pneumothorax. CT ABDOMEN AND PELVIS Liver, spleen, gallbladder, pancreas, adrenal glands:  Normal. Kidneys, ureters, bladder:  Normal. Lymph nodes:  No pathologically enlarged lymph nodes. Ascites:  None. Gastrointestinal: 3.7 cm round smooth mass projects along the posterior wall of the mid stomach. No other stomach mass. Normal small bowel. Normal colon. Normal appendix. MUSCULOSKELETAL Mild degenerative changes along the thoracic spine. No osteoblastic or osteolytic lesions. IMPRESSION: 1. 3.7 cm gastric mass. This has smooth margins. 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 2. No evidence of locally invasive gastric malignancy or of metastatic disease. 3. No acute findings within the chest, abdomen or pelvis. Electronically Signed   By: Lajean Manes M.D.   On: 10/29/2015 20:49    ASSESSMENT AND PLAN  Latasha Holgate Jindra is  a 55 y.o. year old male with a history of obesity, OSA on CPAP, chronic ETOH use, chronic systolic CHF and CAD with recent STEMI s/p DES to RCA (09/14/15) who presented to [facility] on 10/29/15 with N&V, diarrhea, hematemesis and cough. H&H 6.5/20.2 on admission. EGD showed multiple large gastric nodules with multiple ulcers, differential includes GIST vs gastric varices specimens collected. Cardiology asked to weigh in on DAPT and preop risk as pt may need surgery.  CAD recent stent to RCA: 2/17. Fairly large 3.5 mm DES to RCA. On 81 mg ASA. Plan to resume Brillinta once team knows what plan is for cancer May need more biopsies   CHF: EF 35-40%. +9L. Given lasix 20mg  IV x1 yesterday. He did urinate quite a bit, but not reflected in I/Os. Weight appears  stable. No s/s CHF. Continue Coreg 3.125mg  BID. Consider adding ace before d/c.   GI: Path report with ulcer ? 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 Apparently Dr Barry Dienes to see at Dr Bertrum Sol request. Per Dr. Johnsie Cancel, ? Transfer to tertiary center. Need to expedite plans to DAT can be reistituted  Acute blood loss anemia: transfused 4 units PRBC total   Signed, Eileen Stanford PA-C  Pager [phone]  Patient examined chart reviewed. Still may need more biopsies so unable to restart Brillinta Continue ASA follow Hct.  Plan per oncology, GI , and surgery may need transfer to tertiary Center.    Jenkins Rouge

## 2015-11-03 NOTE — Progress Notes (Signed)
PROGRESS NOTE  James Mullins [id number] DOB: 01/05/1961 DOA: 10/29/2015 PCP: No PCP Per Patient Brief History 55 year old male with a history of CAD, recent STEMI, s/p of DES on 09/12/15 (currently on aspirin and Brilinta), hyperlipidemia, OSA on CPAP, obesity, combined systolic and diastolic congestive heart failure with EF of 40-45 percent, GI bleeding, who presents with nausea, vomiting, diarrhea, hematemesis . The patient complained of nausea and vomiting and diarrhea on 10/29/2015. He had some bright red blood per rectum and hematemesis. Altogether, he stated he vomited approximately 4-5 times. Of note, patient was recently hospitalized from 2/11 to 09/14/15 because of STEMI. He is s/p of drug-eluting stent placement and has been on ASA and Brilina. Per previous discharge summary, pt had occasional red blood mixed with stool that he assumed to be hemorrhoids. The patient's hemoglobin was noted to be 6.5 at the time of admission, below his baseline of 9. As result, the patient was admitted for further evaluation. Since admission, the patient was transfused with 4 units PRBC. His hemoglobin has remained stable thereafter.  Gastroenterology, cardiology, and general surgery were consulted.  EGD biopsies on 10/29/2015 were indeterminant pathology. There are plans for repeat biopsy by EUS on 11/05/2015 for a final surgical plan can be ascertained.  Assessment/Plan: Hematemesis/upper GI bleed/Acute Blood loss Anemia -Appreciate GI consult and follow-up -10/29/2015 EGD--multiple subepithelial gastric nodules and Large subepithelial lesion ~5cm with mucosal ulceration concerning for GIST or leiomyoma, not amenable for endoscopic hemostasis -10/30/2015 CT abdomen and pelvis--3.7cm mass posterior wall of the stomach -10/30/2015--transfuse 2 additional units PRBC (4 units total) -Gen. Surgery-->need definitive biopsy and path before proceeding with any plans for surgery back on the  same. -continue Protonix drip-->transition to bid protonix -EUS and bx per GI on 11/05/15 -10/29/15 pathology--spindle cell proliferation, indeterminate for malignancy -advance diet  CAD -recent STEMI with RCA DES--09/12/15 -appreciate cardiology consult--> continue ASA 81 mg, hold Brillinta -If it is felt that surgery will be on hold,restart DAPT -OK for surgery per cardiology  Ischemic cardiomyopathy/chronic systolic and diastolic CHF -123456 echo EF 40-45 percent, grade 2 DD -Continue carvedilol and statin -daily weight--2 lb weight gain since hospitalization -+5L-->lasix IV x1 per cardiology on 4/3  Acute blood loss anemia -Transfused 4 units PRBC total -Monitor hemodynamically -trend hemoglobin--stable after last 2 units PRBCs    Family Communication: Wife updated at beside 4/3 Disposition Plan: Home when medically stable vs transfer to tertiary center--several days       Procedures/Studies: Dg Chest 2 View  10/29/2015  CLINICAL DATA:  Chest pain, shortness of breath. EXAM: CHEST  2 VIEW COMPARISON:  September 12, 2015. FINDINGS: The heart size and mediastinal contours are within normal limits. Both lungs are clear. No pneumothorax or pleural effusion is noted. The visualized skeletal structures are unremarkable. IMPRESSION: No active cardiopulmonary disease. Electronically Signed   By: James Mullins, M.D.   On: 10/29/2015 10:07   Ct Chest W Contrast  10/29/2015  CLINICAL DATA:  55 y.o. male with Gastric mass. 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 Patient reports that he started having nausea, vomiting, diarrhea at about 7 PM. He has mild abdominal discomfort. He vomited once with bright red blood and some blood clots, described as "good amount of blood". EXAM: CT CHEST,  ABDOMEN, AND PELVIS  WITH CONTRAST TECHNIQUE: Multidetector CT imaging of the chest, abdomen and pelvis was performed following the standard protocol during bolus administration of intravenous contrast. CONTRAST:  147mL ISOVUE-300 IOPAMIDOL (ISOVUE-300) INJECTION 61%, 52mL OMNIPAQUE IOHEXOL 300 MG/ML SOLN COMPARISON:  None. FINDINGS: CT CHEST Neck base and axilla: No mass or adenopathy. Visualized thyroid is unremarkable. Mediastinum and hila: Heart normal in size and configuration. Great vessels normal in caliber. No atherosclerotic plaque. No mediastinal or hilar masses or pathologically enlarged lymph nodes. Lungs and pleura: 3.8 mm right lower lobe nodule with an adjacent 2 mm nodule. Otherwise clear. No pleural effusion. No pneumothorax. CT ABDOMEN AND PELVIS Liver, spleen, gallbladder, pancreas, adrenal glands:  Normal. Kidneys, ureters, bladder:  Normal. Lymph nodes:  No pathologically enlarged lymph nodes. Ascites:  None. Gastrointestinal: 3.7 cm round smooth mass projects along the posterior wall of the mid stomach. No other stomach mass. Normal small bowel. Normal colon. Normal appendix. MUSCULOSKELETAL Mild degenerative changes along the thoracic spine. No osteoblastic or osteolytic lesions. IMPRESSION: 1. 3.7 cm gastric mass. This has smooth margins. 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 2. No evidence of locally invasive gastric malignancy or of metastatic disease. 3. No acute findings within the chest, abdomen or pelvis. Electronically Signed   By: James Mullins M.D.   On: 10/29/2015 20:49   Ct Abdomen Pelvis W Contrast  10/29/2015  CLINICAL DATA:  55 y.o. male with Gastric mass. 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 Patient reports that he started having nausea, vomiting, diarrhea at about 7 PM. He has mild  abdominal discomfort. He vomited once with bright red blood and some blood clots, described as "good amount of blood". EXAM: CT CHEST, ABDOMEN, AND PELVIS WITH CONTRAST TECHNIQUE: Multidetector CT imaging of the chest, abdomen and pelvis was performed following the standard protocol during bolus administration of intravenous contrast. CONTRAST:  133mL ISOVUE-300 IOPAMIDOL (ISOVUE-300) INJECTION 61%, 48mL OMNIPAQUE IOHEXOL 300 MG/ML SOLN COMPARISON:  None. FINDINGS: CT CHEST Neck base and axilla: No mass or adenopathy. Visualized thyroid is unremarkable. Mediastinum and hila: Heart normal in size and configuration. Great vessels normal in caliber. No atherosclerotic plaque. No mediastinal or hilar masses or pathologically enlarged lymph nodes. Lungs and pleura: 3.8 mm right lower lobe nodule with an adjacent 2 mm nodule. Otherwise clear. No pleural effusion. No pneumothorax. CT ABDOMEN AND PELVIS Liver, spleen, gallbladder, pancreas, adrenal glands:  Normal. Kidneys, ureters, bladder:  Normal. Lymph nodes:  No pathologically enlarged lymph nodes. Ascites:  None. Gastrointestinal: 3.7 cm round smooth mass projects along the posterior wall of the mid stomach. No other stomach mass. Normal small bowel. Normal colon. Normal appendix. MUSCULOSKELETAL Mild degenerative changes along the thoracic spine. No osteoblastic or osteolytic lesions. IMPRESSION: 1. 3.7 cm gastric mass. This has smooth margins. 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 2. No evidence of locally invasive gastric malignancy or of metastatic disease. 3. No acute findings within the chest, abdomen or pelvis. Electronically Signed   By: James Mullins M.D.   On: 10/29/2015 20:49         Subjective:   Objective: Filed Vitals:   11/02/15 1530 11/02/15 2108 11/03/15 0506 11/03/15 1422  BP: 115/72 94/57 107/67 114/70  Pulse: 54 67 56 60  Temp: 98.5 F (36.9 C) 98.3 F (36.8 C) 97.9 F (36.6 C) 97.8 F (36.6 C)  TempSrc: Oral Oral  Oral Oral  Resp: 20 18  18 20   Height:      Weight:   121.791 kg (268 lb 8 oz)   SpO2: 99% 99% 98%     Intake/Output Summary (Last 24 hours) at 11/03/15 1849 Last data filed at 11/03/15 1446  Gross per 24 hour  Intake 2118.33 ml  Output      0 ml  Net 2118.33 ml   Weight change: -1.043 kg (-2 lb 4.8 oz) Exam:   General:  Pt is alert, follows commands appropriately, not in acute distress  HEENT: No icterus, No thrush, No neck mass, [place]/AT  Cardiovascular: RRR, S1/S2, no rubs, no gallops  Respiratory: CTA bilaterally, no wheezing, no crackles, no rhonchi  Abdomen: Soft/+BS, non tender, non distended, no guarding  Extremities: No edema, No lymphangitis, No petechiae, No rashes, no synovitis  Data Reviewed: Basic Metabolic Panel:  Recent Labs Lab 10/29/15 1010 10/31/15 0512 11/03/15 0453  NA 138 139 142  K 4.8 3.9 4.1  CL 112* 110 111  CO2 21* 23 25  GLUCOSE 136* 90 110*  BUN 34* 11 8  CREATININE 0.77 0.73 0.88  CALCIUM 7.7* 7.6* 7.9*  MG  --   --  1.9   Liver Function Tests:  Recent Labs Lab 10/29/15 1010  AST 19  ALT 17  ALKPHOS 45  BILITOT 0.2*  PROT 5.0*  ALBUMIN 2.8*   No results for input(s): LIPASE, AMYLASE in the last 168 hours. No results for input(s): AMMONIA in the last 168 hours. CBC:  Recent Labs Lab 10/29/15 1010  10/30/15 0807 10/31/15 0512 11/01/15 0505 11/02/15 0441 11/03/15 0453  WBC 8.7  < > 4.9 3.8* 3.8* 3.9* 4.6  NEUTROABS 6.7  --   --   --   --   --   --   HGB 6.5*  < > 6.4* 7.9* 8.0* 7.9* 8.4*  HCT 20.2*  < > 19.6* 23.6* 24.1* 23.4* 26.0*  MCV 82.1  < > 83.4 84.0 84.3 83.3 85.5  PLT 240  < > 161 146* 168 180 220  < > = values in this interval not displayed. Cardiac Enzymes: No results for input(s): CKTOTAL, CKMB, CKMBINDEX, TROPONINI in the last 168 hours. BNP: Invalid input(s): POCBNP CBG:  Recent Labs Lab 10/30/15 0745 10/31/15 0751 11/01/15 0735 11/02/15 0813 11/03/15 0733  GLUCAP 96 79 77 85 93    No results found for this or  any previous visit (from the past 240 hour(s)).   Scheduled Meds: . aspirin  81 mg Oral Daily  . atorvastatin  80 mg Oral q1800  . carvedilol  3.125 mg Oral BID WC  . dextromethorphan-guaiFENesin  1 tablet Oral BID  . multivitamin with minerals  1 tablet Oral Daily  . sodium chloride flush  3 mL Intravenous Q12H   Continuous Infusions: . sodium chloride 50 mL/hr at 11/03/15 0724     Brandi Armato, DO  [facility] Pager [phone]  If 7PM-7AM, please contact night-coverage [email or web address] Password [email or web address] 11/03/2015, 6:49 PM   LOS: 5 days

## 2015-11-03 NOTE — Progress Notes (Signed)
5 Days Post-Op  Subjective: No abdominal pain.  Objective: Vital signs in last 24 hours: Temp:  [97.9 F (36.6 C)-98.5 F (36.9 C)] 97.9 F (36.6 C) (04/04 0506) Pulse Rate:  [54-67] 56 (04/04 0506) Resp:  [18-20] 18 (04/04 0506) BP: (94-115)/(57-72) 107/67 mmHg (04/04 0506) SpO2:  [98 %-99 %] 98 % (04/04 0506) Weight:  [121.791 kg (268 lb 8 oz)] 121.791 kg (268 lb 8 oz) (04/04 0506) Last BM Date: 10/30/15  Intake/Output from previous day: 04/03 0701 - 04/04 0700 In: 1560 [P.O.:360; I.V.:1200] Out: 4 [Urine:4] Intake/Output this shift: Total I/O In: 480 [P.O.:480] Out: -   PE: General- In NAD Abdomen-soft  Lab Results:   Recent Labs  11/02/15 0441 11/03/15 0453  WBC 3.9* 4.6  HGB 7.9* 8.4*  HCT 23.4* 26.0*  PLT 180 220   BMET  Recent Labs  11/03/15 0453  NA 142  K 4.1  CL 111  CO2 25  GLUCOSE 110*  BUN 8  CREATININE 0.88  CALCIUM 7.9*   PT/INR No results for input(s): LABPROT, INR in the last 72 hours. Comprehensive Metabolic Panel:    Component Value Date/Time   NA 142 11/03/2015 0453   NA 139 10/31/2015 0512   K 4.1 11/03/2015 0453   K 3.9 10/31/2015 0512   CL 111 11/03/2015 0453   CL 110 10/31/2015 0512   CO2 25 11/03/2015 0453   CO2 23 10/31/2015 0512   BUN 8 11/03/2015 0453   BUN 11 10/31/2015 0512   CREATININE 0.88 11/03/2015 0453   CREATININE 0.73 10/31/2015 0512   GLUCOSE 110* 11/03/2015 0453   GLUCOSE 90 10/31/2015 0512   CALCIUM 7.9* 11/03/2015 0453   CALCIUM 7.6* 10/31/2015 0512   AST 19 10/29/2015 1010   AST 49* 09/12/2015 1458   ALT 17 10/29/2015 1010   ALT 23 09/12/2015 1458   ALKPHOS 45 10/29/2015 1010   ALKPHOS 55 09/12/2015 1458   BILITOT 0.2* 10/29/2015 1010   BILITOT 0.4 09/12/2015 1458   PROT 5.0* 10/29/2015 1010   PROT 5.9* 09/12/2015 1458   ALBUMIN 2.8* 10/29/2015 1010   ALBUMIN 3.3* 09/12/2015 1458     Studies/Results: No results found.  Anti-infectives: Anti-infectives    None       Assessment Principal Problem:   GI bleed due to gastric mass (3.7cm) that by gross exam and CT is highly suspicious for a GIST.  However, after speaking with Dr. Saralyn Pilar, immunohistochemistry does not support this and he feels that the specimen is inadequate to make the diagnosis.  Appreciate GI and Oncology input.  Dr. Barry Dienes is reviewing the situation as well. Active Problems:   Obesity (BMI 30.0-34.9)   ST elevation (STEMI) myocardial infarction involving right coronary artery (HCC)   OSA (obstructive sleep apnea)   Anemia   Chronic combined systolic and diastolic CHF (congestive heart failure) ([place])      LOS: 5 days   Plan: Ideally, repeat EGD with EUS and biopsy of large mass and one of the smaller masses (which may just be reactive).  Send pathology specimens with attention to Dr. Claudette Laws per his request.  Run cKIT on specimen if GIST confirmed and if positive, discuss neoadjuvant Gleevac.  Will also speak with IR about possibility of embolizing main vessel to large mass to decrease risk of significant bleeding during neoadjuvant treatment.  Discussed with patient, Dr. Loletha Carrow, Dr. Saralyn Pilar and Dr. Carles Collet.     James Mullins 11/03/2015

## 2015-11-03 NOTE — Progress Notes (Signed)
Patient ID: James Mullins, male   DOB: 1960-12-08, 55 y.o.   MRN: [id number]    Progress Note   Subjective   Pt feels fine, tolerating pos' , no melena or bleeding   Objective   Vital signs in last 24 hours: Temp:  [97.9 F (36.6 C)-98.5 F (36.9 C)] 97.9 F (36.6 C) (04/04 0506) Pulse Rate:  [54-67] 56 (04/04 0506) Resp:  [18-20] 18 (04/04 0506) BP: (94-115)/(57-72) 107/67 mmHg (04/04 0506) SpO2:  [98 %-99 %] 98 % (04/04 0506) Weight:  [268 lb 8 oz (121.791 kg)] 268 lb 8 oz (121.791 kg) (04/04 0506) Last BM Date: 10/30/15 General:    white male  in NAD Heart:  Regular rate and rhythm; no murmurs Lungs: Respirations even and unlabored, lungs CTA bilaterally Abdomen:  Soft, large  nontender and nondistended. Normal bowel sounds. Extremities:  Without edema. Neurologic:  Alert and oriented,  grossly normal neurologically. Psych:  Cooperative. Normal mood and affect.  Intake/Output from previous day: 04/03 0701 - 04/04 0700 In: 1560 [P.O.:360; I.V.:1200] Out: 4 [Urine:4] Intake/Output this shift: Total I/O In: 480 [P.O.:480] Out: -   Lab Results:  Recent Labs  11/01/15 0505 11/02/15 0441 11/03/15 0453  WBC 3.8* 3.9* 4.6  HGB 8.0* 7.9* 8.4*  HCT 24.1* 23.4* 26.0*  PLT 168 180 220   BMET  Recent Labs  11/03/15 0453  NA 142  K 4.1  CL 111  CO2 25  GLUCOSE 110*  BUN 8  CREATININE 0.88  CALCIUM 7.9*   LFT No results for input(s): PROT, ALBUMIN, AST, ALT, ALKPHOS, BILITOT, BILIDIR, IBILI in the last 72 hours. PT/INR No results for input(s): LABPROT, INR in the last 72 hours.    Assessment / Plan:    #1 55 yo WM with acute GI bleed in setting  Of anticoagulation therapy post recent MI and DES - one month ago. 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 Have scheduled for EUS and FNA with Dr Ardis Hughs on Thursday at 2 pm. Dr Loletha Carrow has discussed with surgery as well. He will remain on baby ASA  Principal Problem:   GI bleed Active Problems:   Obesity (BMI 30.0-34.9)   ST elevation (STEMI) myocardial infarction involving right coronary artery (HCC)   OSA (obstructive sleep apnea)   Anemia   Chronic combined systolic and diastolic CHF (congestive heart failure) (HCC)   Cough   Acute blood loss anemia   Upper GI bleed   Gastric mass   Bleeding gastrointestinal   Melena     LOS: 5 days   [name]  11/03/2015, 9:39 AM

## 2015-11-04 DIAGNOSIS — I10 Essential (primary) hypertension: Secondary | ICD-10-CM

## 2015-11-04 DIAGNOSIS — K922 Gastrointestinal hemorrhage, unspecified: Secondary | ICD-10-CM | POA: Insufficient documentation

## 2015-11-04 DIAGNOSIS — E785 Hyperlipidemia, unspecified: Secondary | ICD-10-CM

## 2015-11-04 DIAGNOSIS — C49A0 Gastrointestinal stromal tumor, unspecified site: Secondary | ICD-10-CM

## 2015-11-04 DIAGNOSIS — G4733 Obstructive sleep apnea (adult) (pediatric): Secondary | ICD-10-CM

## 2015-11-04 LAB — CBC
HEMATOCRIT: 23.6 % — AB (ref 39.0–52.0)
Hemoglobin: 7.8 g/dL — ABNORMAL LOW (ref 13.0–17.0)
MCH: 27.7 pg (ref 26.0–34.0)
MCHC: 33.1 g/dL (ref 30.0–36.0)
MCV: 83.7 fL (ref 78.0–100.0)
PLATELETS: 218 10*3/uL (ref 150–400)
RBC: 2.82 MIL/uL — ABNORMAL LOW (ref 4.22–5.81)
RDW: 17.4 % — AB (ref 11.5–15.5)
WBC: 3.7 10*3/uL — AB (ref 4.0–10.5)

## 2015-11-04 LAB — GLUCOSE, CAPILLARY: Glucose-Capillary: 89 mg/dL (ref 65–99)

## 2015-11-04 LAB — PREPARE RBC (CROSSMATCH)

## 2015-11-04 MED ORDER — LOSARTAN POTASSIUM 50 MG PO TABS
25.0000 mg | ORAL_TABLET | Freq: Every day | ORAL | Status: DC
  Administered 2015-11-04 – 2015-11-06 (×3): 25 mg via ORAL
  Filled 2015-11-04 (×3): qty 1

## 2015-11-04 MED ORDER — FUROSEMIDE 10 MG/ML IJ SOLN
20.0000 mg | Freq: Once | INTRAMUSCULAR | Status: AC
  Administered 2015-11-04: 20 mg via INTRAVENOUS
  Filled 2015-11-04: qty 2

## 2015-11-04 MED ORDER — SODIUM CHLORIDE 0.9 % IV SOLN
Freq: Once | INTRAVENOUS | Status: AC
  Administered 2015-11-04: 16:00:00 via INTRAVENOUS

## 2015-11-04 NOTE — Progress Notes (Deleted)
Heart failure zone tool given and education provided. Pt would benefit from reinforcement of material. Will report to day shift rn to reinforce teaching. VWilliams,rn.

## 2015-11-04 NOTE — Progress Notes (Signed)
Patient ID: James Mullins, male   DOB: [birth year], 55 y.o.   MRN: [id number]   Patient Name: James Mullins Date of Encounter: 11/04/2015     Principal Problem:   GI bleed Active Problems:   Obesity (BMI 30.0-34.9)   ST elevation (STEMI) myocardial infarction involving right coronary artery (HCC)   OSA (obstructive sleep apnea)   Anemia   Chronic combined systolic and diastolic CHF (congestive heart failure) (HCC)   Cough   Acute blood loss anemia   Upper GI bleed   Gastric mass   Bleeding gastrointestinal   Melena    SUBJECTIVE  . No CP or SOB.   CURRENT MEDS . aspirin  81 mg Oral Daily  . atorvastatin  80 mg Oral q1800  . carvedilol  3.125 mg Oral BID WC  . dextromethorphan-guaiFENesin  1 tablet Oral BID  . multivitamin with minerals  1 tablet Oral Daily  . pantoprazole  40 mg Oral BID AC  . sodium chloride flush  3 mL Intravenous Q12H    OBJECTIVE  Filed Vitals:   11/03/15 0506 11/03/15 1422 11/03/15 2100 11/04/15 0500  BP: 107/67 114/70 124/67 130/75  Pulse: 56 60 63 55  Temp: 97.9 F (36.6 C) 97.8 F (36.6 C) 98.4 F (36.9 C) 98.4 F (36.9 C)  TempSrc: Oral Oral Oral Oral  Resp: 18 20 20 18   Height:      Weight: 121.791 kg (268 lb 8 oz)   122.653 kg (270 lb 6.4 oz)  SpO2: 98%  99% 98%    Intake/Output Summary (Last 24 hours) at 11/04/15 0729 Last data filed at 11/04/15 0624  Gross per 24 hour  Intake   1700 ml  Output      0 ml  Net   1700 ml   Filed Weights   11/02/15 0446 11/03/15 0506 11/04/15 0500  Weight: 122.925 kg (271 lb) 121.791 kg (268 lb 8 oz) 122.653 kg (270 lb 6.4 oz)    PHYSICAL EXAM  General: Pleasant, NAD. obese Neuro: Alert and oriented X 3. Moves all extremities spontaneously. Psych: Normal affect. HEENT:  Normal  Neck: Supple without bruits or JVD. Lungs:  Resp regular and unlabored, CTA. Heart: RRR no s3, s4, or murmurs. Abdomen: Soft, non-tender, non-distended, BS + x 4.  Extremities: No clubbing,  cyanosis or edema. DP/PT/Radials 2+ and equal bilaterally.  Accessory Clinical Findings  CBC  Recent Labs  11/03/15 0453 11/04/15 0442  WBC 4.6 3.7*  HGB 8.4* 7.8*  HCT 26.0* 23.6*  MCV 85.5 83.7  PLT 220 [id number]   Basic Metabolic Panel  Recent Labs  11/03/15 0453  NA 142  K 4.1  CL 111  CO2 25  GLUCOSE 110*  BUN 8  CREATININE 0.88  CALCIUM 7.9*  MG 1.9    TELE  NSR with some PACs and PVCs.  11/04/2015   Radiology/Studies  Dg Chest 2 View  10/29/2015  CLINICAL DATA:  Chest pain, shortness of breath. EXAM: CHEST  2 VIEW COMPARISON:  September 12, 2015. FINDINGS: The heart size and mediastinal contours are within normal limits. Both lungs are clear. No pneumothorax or pleural effusion is noted. The visualized skeletal structures are unremarkable. IMPRESSION: No active cardiopulmonary disease. Electronically Signed   By: Marijo Conception, M.D.   On: 10/29/2015 10:07   Ct Chest W Contrast  10/29/2015  CLINICAL DATA:  55 y.o. male with Gastric mass. Hx of PMH of CAD, recent STEMI, hyperlipidemia, OSA on CPAP, obesity, combined systolic and  diastolic congestive heart failure with EF of 40-45 percent, GI bleeding, who presents with nausea, vomiting, diarrhea, hematemesis and cough. Patient reports that he started having nausea, vomiting, diarrhea at about 7 PM. He has mild abdominal discomfort. He vomited once with bright red blood and some blood clots, described as "good amount of blood". EXAM: CT CHEST, ABDOMEN, AND PELVIS WITH CONTRAST TECHNIQUE: Multidetector CT imaging of the chest, abdomen and pelvis was performed following the standard protocol during bolus administration of intravenous contrast. CONTRAST:  175mL ISOVUE-300 IOPAMIDOL (ISOVUE-300) INJECTION 61%, 56mL OMNIPAQUE IOHEXOL 300 MG/ML SOLN COMPARISON:  None. FINDINGS: CT CHEST Neck base and axilla: No mass or adenopathy. Visualized thyroid is unremarkable. Mediastinum and hila: Heart normal in size and configuration. Great  vessels normal in caliber. No atherosclerotic plaque. No mediastinal or hilar masses or pathologically enlarged lymph nodes. Lungs and pleura: 3.8 mm right lower lobe nodule with an adjacent 2 mm nodule. Otherwise clear. No pleural effusion. No pneumothorax. CT ABDOMEN AND PELVIS Liver, spleen, gallbladder, pancreas, adrenal glands:  Normal. Kidneys, ureters, bladder:  Normal. Lymph nodes:  No pathologically enlarged lymph nodes. Ascites:  None. Gastrointestinal: 3.7 cm round smooth mass projects along the posterior wall of the mid stomach. No other stomach mass. Normal small bowel. Normal colon. Normal appendix. MUSCULOSKELETAL Mild degenerative changes along the thoracic spine. No osteoblastic or osteolytic lesions. IMPRESSION: 1. 3.7 cm gastric mass. This has smooth margins. 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 2. No evidence of locally invasive gastric malignancy or of metastatic disease. 3. No acute findings within the chest, abdomen or pelvis. Electronically Signed   By: Lajean Manes M.D.   On: 10/29/2015 20:49   Ct Abdomen Pelvis W Contrast  10/29/2015  CLINICAL DATA:  55 y.o. male with Gastric mass. 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 Patient reports that he started having nausea, vomiting, diarrhea at about 7 PM. He has mild abdominal discomfort. He vomited once with bright red blood and some blood clots, described as "good amount of blood". EXAM: CT CHEST, ABDOMEN, AND PELVIS WITH CONTRAST TECHNIQUE: Multidetector CT imaging of the chest, abdomen and pelvis was performed following the standard protocol during bolus administration of intravenous contrast. CONTRAST:  1103mL ISOVUE-300 IOPAMIDOL (ISOVUE-300) INJECTION 61%, 96mL OMNIPAQUE IOHEXOL 300 MG/ML SOLN COMPARISON:  None. FINDINGS: CT CHEST Neck base and axilla:  No mass or adenopathy. Visualized thyroid is unremarkable. Mediastinum and hila: Heart normal in size and configuration. Great vessels normal in caliber. No atherosclerotic plaque. No mediastinal or hilar masses or pathologically enlarged lymph nodes. Lungs and pleura: 3.8 mm right lower lobe nodule with an adjacent 2 mm nodule. Otherwise clear. No pleural effusion. No pneumothorax. CT ABDOMEN AND PELVIS Liver, spleen, gallbladder, pancreas, adrenal glands:  Normal. Kidneys, ureters, bladder:  Normal. Lymph nodes:  No pathologically enlarged lymph nodes. Ascites:  None. Gastrointestinal: 3.7 cm round smooth mass projects along the posterior wall of the mid stomach. No other stomach mass. Normal small bowel. Normal colon. Normal appendix. MUSCULOSKELETAL Mild degenerative changes along the thoracic spine. No osteoblastic or osteolytic lesions. IMPRESSION: 1. 3.7 cm gastric mass. This has smooth margins. 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 2. No evidence of locally invasive gastric malignancy or of metastatic disease. 3. No acute findings within the chest, abdomen or pelvis. Electronically Signed   By: Dedra Skeens.D.  On: 10/29/2015 20:49    ASSESSMENT AND PLAN  James Mullins is a 55 y.o. year old male with a history of obesity, OSA on CPAP, chronic ETOH use, chronic systolic CHF and CAD with recent STEMI s/p DES to RCA (09/14/15) who presented to [facility] on 10/29/15 with N&V, diarrhea, hematemesis and cough. H&H 6.5/20.2 on admission. EGD showed multiple large gastric nodules with multiple ulcers, differential includes GIST vs gastric varices specimens collected. Cardiology asked to weigh in on DAPT and preop risk as pt may need surgery.  CAD recent stent to RCA: 2/17. Fairly large 3.5 mm DES to RCA. On 81 mg ASA. Plan to resume Brillinta once team knows what plan is for cancer Spoke with Dr Zella Richer this am And patient needs EGD with [place] and more biopsies so cannot resume Brillinta   CHF: EF 35-40%.  Weight appears stable. No s/s CHF. Continue Coreg 3.125mg  BID. Add low dose ACE    GI: Path report with ulcer ? 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 Apparently Dr Barry Dienes to see at Dr Bertrum Sol request. Per Dr. Johnsie Cancel, ? Transfer to tertiary center. Need to expedite plans to DAT can be reistituted  Acute blood loss anemia: transfused 4 units PRBC total   Jenkins Rouge

## 2015-11-04 NOTE — Progress Notes (Signed)
Patient ID: James Mullins, male   DOB: 13-Feb-1961, 55 y.o.   MRN: [id number]   Discussed symptomatic anemia in pt one month post MI, and scheduled for BX tomorrow which may cause increase in bleeding- will transfuse  x 2 today  hgb 7.8 this am repeat hgb in am

## 2015-11-04 NOTE — Progress Notes (Signed)
PROGRESS NOTE    James Mullins  [id number]  DOB: 06-Aug-1960  DOA: 10/29/2015 PCP: No PCP Per Patient Outpatient Specialists:   Hospital course: 55 year old male with a PMH of CAD, recent STEMI (hospitalized 09/12/15-09/14/15), s/p DES RCA on 09/12/15 (on aspirin and Brilinta PTA), HLD, OSA on CPAP, obesity, chronic combined systolic and diastolic CHF with EF 123XX123 percent, GI bleeding, iron deficiency anemia presented to ED on 10/29/15 with nausea, vomiting, diarrhea, hematemesis and cough. In the ED, Hb 6.5, down from 9 on 09/21/15. Admitted to step down for management.   Assessment & Plan:   Acute upper GI bleed/gastric masses - In the setting of aspirin and Brilinta - EGD 10/29/15 showed multiple large gastric nodules with multiple ulcers, differential includes GIST versus gastric varices. No evidence of metastasis on CT scan. - Cardiology was consulted regarding recommendations for DAPT given recent STEMI and recommended holding Brilinta but continuing low-dose aspirin 81 MG daily. As per cardiology, he is approximately 1.5 months outside of stent placement and still at high risk for stent thrombosis. If active bleeding continues, we will need to stop aspirin as well. If no surgery planned, may consider adding Plavix without loading, instead of Brilinta. Plavix may result in less bleeding side effects. Very challenging situation. Cardiology has cleared for surgery if warranted - CT abdomen 3/31 showed 3.7 cm mass posterior wall of the stomach. - Pathology was not definitive for GIST-specimen inadequate to make the diagnosis. - IV protonix was transitioned to oral - [facility] follow-up appreciated. They have discussed case with general surgery and pathology and plan for EGD/EUS on 4/6 for deeper biopsies of at least the larger lesion and hopefully one of the smaller ones if technicaly feasible. - Oncology input 11/02/15 appreciated. If GIST is confirmed, patient may be a candidate  for Gleevec in an attempt to keep him from needing radical surgery which carries high morbidity. - General surgery plans to discuss with IR regarding possibility of embolizing main vessel to the large mass to decrease risk of significant bleeding during neoadjuvant Rx. - Decision regarding possible medical management short-term with Gleevec versus surgery pending definitive diagnosis.  Acute blood loss anemia complicating severe iron deficiency anemia - Secondary to acute GI bleed. - Status post 4 units PRBCs since admission. Hemoglobin 7.8 on 4/5.  - GI plans to transfuse 2 units PRBCs today due to risk for bleeding/bruising at procedure tomorrow and aim to keep hemoglobin >9 given recent MI.  OSA - Continue nightly CPAP.  CAD, recent STEMI, s/p DES to RCA 09/12/15 - Holding ASA and Brilinta d/t GIB. - Continue carvedilol and Lipitor. Asymptomatic of chest pain.  Chronic combined systolic and diastolic CHF/ischemic cardiomyopathy - 2-D echo 09/14/15: LVEF 40-45 percent and grade 2 diastolic dysfunction. - Compensated. Continue carvedilol and losartan. Not on diuretics-use when necessary and with transfusions for overload.   DVT prophylaxis: SCD's Code Status: Full Family Communication: None at bedside Disposition Plan: DC home when medically stable.   Consultants:  [facility]  Cardiology  Oncology  Procedures:  EGD 10/29/15: Impression:Multiple large gastric nodules with multiple ulcers   differential includes GIST vs gastric varices  specimens collected.  Recommendation:      - Await pathology results  -CT chest, abd & pelvis with contrast  -Monitori Hgb q8h and transfuse as needed to   mainatin Hgb >8  -PPI gtt  -NPO  -Antiplatelet therapy as per  cardiology given   recent STEMI and stent placement  Antimicrobials:  None   Subjective: Brown stools this morning without melena or blood in stools. No nausea, vomiting or abdominal pain. Denies dyspnea, chest pain, dizziness or lightheadedness.  Objective: Filed Vitals:   11/03/15 2100 11/04/15 0500 11/04/15 0926 11/04/15 1341  BP: 124/67 130/75 113/73 111/62  Pulse: 63 55 61 62  Temp: 98.4 F (36.9 C) 98.4 F (36.9 C)  98.3 F (36.8 C)  TempSrc: Oral Oral  Oral  Resp: 20 18  20   Height:      Weight:  122.653 kg (270 lb 6.4 oz)    SpO2: 99% 98%  100%    Intake/Output Summary (Last 24 hours) at 11/04/15 1557 Last data filed at 11/04/15 1300  Gross per 24 hour  Intake 1261.67 ml  Output      1 ml  Net 1260.67 ml   Filed Weights   11/02/15 0446 11/03/15 0506 11/04/15 0500  Weight: 122.925 kg (271 lb) 121.791 kg (268 lb 8 oz) 122.653 kg (270 lb 6.4 oz)    Exam:  General exam: Pleasant middle-aged male sitting up comfortably this morning  Respiratory system: Clear. No increased work of breathing. Cardiovascular system: S1 & S2 heard, RRR. No JVD, murmurs, gallops, clicks or pedal edema.Telemetry: SB in the 50s-SR with occasional PVCs.  Gastrointestinal system: Abdomen is nondistended, soft and nontender. Normal bowel sounds heard. Central nervous system: Alert and oriented. No focal neurological deficits. Extremities: Symmetric 5 x 5 power.   Data Reviewed: Basic Metabolic Panel:  Recent Labs Lab 10/29/15 1010 10/31/15 0512 11/03/15 0453  NA 138 139 142  K 4.8 3.9 4.1  CL 112* 110 111  CO2 21* 23 25  GLUCOSE 136* 90 110*  BUN 34* 11 8  CREATININE 0.77 0.73 0.88  CALCIUM 7.7* 7.6* 7.9*  MG  --   --  1.9   Liver Function Tests:  Recent Labs Lab 10/29/15 1010  AST 19  ALT 17  ALKPHOS 45  BILITOT 0.2*  PROT 5.0*  ALBUMIN 2.8*   No results for input(s): LIPASE, AMYLASE in the last 168 hours. No results for input(s):  AMMONIA in the last 168 hours. CBC:  Recent Labs Lab 10/29/15 1010  10/31/15 0512 11/01/15 0505 11/02/15 0441 11/03/15 0453 11/04/15 0442  WBC 8.7  < > 3.8* 3.8* 3.9* 4.6 3.7*  NEUTROABS 6.7  --   --   --   --   --   --   HGB 6.5*  < > 7.9* 8.0* 7.9* 8.4* 7.8*  HCT 20.2*  < > 23.6* 24.1* 23.4* 26.0* 23.6*  MCV 82.1  < > 84.0 84.3 83.3 85.5 83.7  PLT 240  < > 146* 168 180 220 218  < > = values in this interval not displayed. Cardiac Enzymes: No results for input(s): CKTOTAL, CKMB, CKMBINDEX, TROPONINI in the last 168 hours. BNP (last 3 results) No results for input(s): PROBNP in the last 8760 hours. CBG:  Recent Labs Lab 10/30/15 0745 10/31/15 0751 11/01/15 0735 11/02/15 0813 11/03/15 0733  GLUCAP 96 79 77 85 93    No results found for this or any previous visit (from the past 240 hour(s)).       Studies: No results found.      Scheduled Meds: . sodium chloride   Intravenous Once  . aspirin  81 mg Oral Daily  . atorvastatin  80 mg Oral q1800  . carvedilol  3.125 mg Oral BID WC  . dextromethorphan-guaiFENesin  1 tablet Oral BID  . losartan  25  mg Oral Daily  . multivitamin with minerals  1 tablet Oral Daily  . pantoprazole  40 mg Oral BID AC  . sodium chloride flush  3 mL Intravenous Q12H   Continuous Infusions: . sodium chloride Stopped (11/04/15 0926)    Principal Problem:   GI bleed Active Problems:   Obesity (BMI 30.0-34.9)   ST elevation (STEMI) myocardial infarction involving right coronary artery (HCC)   OSA (obstructive sleep apnea)   Anemia   Chronic combined systolic and diastolic CHF (congestive heart failure) (HCC)   Cough   Acute blood loss anemia   Upper GI bleed   Gastric mass   Bleeding gastrointestinal   Melena    Time spent: 30 minutes.    Vernell Leep, MD, FACP, FHM. [facility] Pager [phone]  If 7PM-7AM, please contact night-coverage [email or web address] Password [email or web address] 11/04/2015, 3:57 PM    LOS: 6 days

## 2015-11-04 NOTE — Progress Notes (Signed)
6 Days Post-Op  Subjective: No abdominal pain.  No melena  Objective: Vital signs in last 24 hours: Temp:  [97.8 F (36.6 C)-98.4 F (36.9 C)] 98.4 F (36.9 C) (04/05 0500) Pulse Rate:  [55-63] 55 (04/05 0500) Resp:  [18-20] 18 (04/05 0500) BP: (114-130)/(67-75) 130/75 mmHg (04/05 0500) SpO2:  [98 %-99 %] 98 % (04/05 0500) Weight:  [122.653 kg (270 lb 6.4 oz)] 122.653 kg (270 lb 6.4 oz) (04/05 0500) Last BM Date: 10/30/15  Intake/Output from previous day: 04/04 0701 - 04/05 0700 In: 1700 [P.O.:480; I.V.:1220] Out: -  Intake/Output this shift: Total I/O In: 240 [P.O.:240] Out: -   PE: General- In NAD Abdomen-soft  Lab Results:   Recent Labs  11/03/15 0453 11/04/15 0442  WBC 4.6 3.7*  HGB 8.4* 7.8*  HCT 26.0* 23.6*  PLT 220 218   BMET  Recent Labs  11/03/15 0453  NA 142  K 4.1  CL 111  CO2 25  GLUCOSE 110*  BUN 8  CREATININE 0.88  CALCIUM 7.9*   PT/INR No results for input(s): LABPROT, INR in the last 72 hours. Comprehensive Metabolic Panel:    Component Value Date/Time   NA 142 11/03/2015 0453   NA 139 10/31/2015 0512   K 4.1 11/03/2015 0453   K 3.9 10/31/2015 0512   CL 111 11/03/2015 0453   CL 110 10/31/2015 0512   CO2 25 11/03/2015 0453   CO2 23 10/31/2015 0512   BUN 8 11/03/2015 0453   BUN 11 10/31/2015 0512   CREATININE 0.88 11/03/2015 0453   CREATININE 0.73 10/31/2015 0512   GLUCOSE 110* 11/03/2015 0453   GLUCOSE 90 10/31/2015 0512   CALCIUM 7.9* 11/03/2015 0453   CALCIUM 7.6* 10/31/2015 0512   AST 19 10/29/2015 1010   AST 49* 09/12/2015 1458   ALT 17 10/29/2015 1010   ALT 23 09/12/2015 1458   ALKPHOS 45 10/29/2015 1010   ALKPHOS 55 09/12/2015 1458   BILITOT 0.2* 10/29/2015 1010   BILITOT 0.4 09/12/2015 1458   PROT 5.0* 10/29/2015 1010   PROT 5.9* 09/12/2015 1458   ALBUMIN 2.8* 10/29/2015 1010   ALBUMIN 3.3* 09/12/2015 1458     Studies/Results: No results found.  Anti-infectives: Anti-infectives    None       Assessment Principal Problem:   GI bleed due to gastric mass (3.7cm) that by gross exam and CT is highly suspicious for a GIST.  However, after speaking with Dr. Saralyn Pilar, immunohistochemistry does not support this and he feels that the specimen is inadequate to make the diagnosis.  Both surgical oncologists in town (Dr. Barry Dienes and Rolanda Jay) feel that if the large tumor is a GIST and cKIT positive, neoadjuvant Gleevac should be tried due to his increased operative risk from recent MI Active Problems:   Obesity (BMI 30.0-34.9)   ST elevation (STEMI) myocardial infarction involving right coronary artery (HCC)   OSA (obstructive sleep apnea)   Anemia   Chronic combined systolic and diastolic CHF (congestive heart failure) ([place])      LOS: 6 days   Plan:  Repeat EGD, EUS and biopsies tomorrow.   James Mullins J 11/04/2015

## 2015-11-04 NOTE — Progress Notes (Signed)
Patient Name: James Mullins Date of Encounter: 11/04/2015     Principal Problem:   GI bleed Active Problems:   Obesity (BMI 30.0-34.9)   ST elevation (STEMI) myocardial infarction involving right coronary artery (HCC)   OSA (obstructive sleep apnea)   Anemia   Chronic combined systolic and diastolic CHF (congestive heart failure) (HCC)   Cough   Acute blood loss anemia   Upper GI bleed   Gastric mass   Bleeding gastrointestinal   Melena    SUBJECTIVE  No complaints at all. Plan to do EGD with biopsy tommorrow. No CP or SOB.   CURRENT MEDS . aspirin  81 mg Oral Daily  . atorvastatin  80 mg Oral q1800  . carvedilol  3.125 mg Oral BID WC  . dextromethorphan-guaiFENesin  1 tablet Oral BID  . multivitamin with minerals  1 tablet Oral Daily  . pantoprazole  40 mg Oral BID AC  . sodium chloride flush  3 mL Intravenous Q12H    OBJECTIVE  Filed Vitals:   11/03/15 0506 11/03/15 1422 11/03/15 2100 11/04/15 0500  BP: 107/67 114/70 124/67 130/75  Pulse: 56 60 63 55  Temp: 97.9 F (36.6 C) 97.8 F (36.6 C) 98.4 F (36.9 C) 98.4 F (36.9 C)  TempSrc: Oral Oral Oral Oral  Resp: 18 20 20 18   Height:      Weight: 268 lb 8 oz (121.791 kg)   270 lb 6.4 oz (122.653 kg)  SpO2: 98%  99% 98%    Intake/Output Summary (Last 24 hours) at 11/04/15 0729 Last data filed at 11/04/15 0624  Gross per 24 hour  Intake   1700 ml  Output      0 ml  Net   1700 ml   Filed Weights   11/02/15 0446 11/03/15 0506 11/04/15 0500  Weight: 271 lb (122.925 kg) 268 lb 8 oz (121.791 kg) 270 lb 6.4 oz (122.653 kg)    PHYSICAL EXAM  General: Pleasant, NAD. obese Neuro: Alert and oriented X 3. Moves all extremities spontaneously. Psych: Normal affect. HEENT:  Normal  Neck: Supple without bruits or JVD. Lungs:  Resp regular and unlabored, CTA. Heart: RRR no s3, s4, or murmurs. Abdomen: Soft, non-tender, non-distended, BS + x 4.  Extremities: No clubbing, cyanosis or edema. DP/PT/Radials  2+ and equal bilaterally.  Accessory Clinical Findings  CBC  Recent Labs  11/03/15 0453 11/04/15 0442  WBC 4.6 3.7*  HGB 8.4* 7.8*  HCT 26.0* 23.6*  MCV 85.5 83.7  PLT 220 [id number]   Basic Metabolic Panel  Recent Labs  11/03/15 0453  NA 142  K 4.1  CL 111  CO2 25  GLUCOSE 110*  BUN 8  CREATININE 0.88  CALCIUM 7.9*  MG 1.9    TELE  NSR with some PACs and PVCs. Some sinus brady overnight.   Radiology/Studies  Dg Chest 2 View  10/29/2015  CLINICAL DATA:  Chest pain, shortness of breath. EXAM: CHEST  2 VIEW COMPARISON:  September 12, 2015. FINDINGS: The heart size and mediastinal contours are within normal limits. Both lungs are clear. No pneumothorax or pleural effusion is noted. The visualized skeletal structures are unremarkable. IMPRESSION: No active cardiopulmonary disease. Electronically Signed   By: Marijo Conception, M.D.   On: 10/29/2015 10:07   Ct Chest W Contrast  10/29/2015  CLINICAL DATA:  55 y.o. male with Gastric mass. Hx of PMH of CAD, recent STEMI, hyperlipidemia, OSA on CPAP, obesity, combined systolic and diastolic congestive heart failure with  EF of 40-45 percent, GI bleeding, who presents with nausea, vomiting, diarrhea, hematemesis and cough. Patient reports that he started having nausea, vomiting, diarrhea at about 7 PM. He has mild abdominal discomfort. He vomited once with bright red blood and some blood clots, described as "good amount of blood". EXAM: CT CHEST, ABDOMEN, AND PELVIS WITH CONTRAST TECHNIQUE: Multidetector CT imaging of the chest, abdomen and pelvis was performed following the standard protocol during bolus administration of intravenous contrast. CONTRAST:  124mL ISOVUE-300 IOPAMIDOL (ISOVUE-300) INJECTION 61%, 72mL OMNIPAQUE IOHEXOL 300 MG/ML SOLN COMPARISON:  None. FINDINGS: CT CHEST Neck base and axilla: No mass or adenopathy. Visualized thyroid is unremarkable. Mediastinum and hila: Heart normal in size and configuration. Great vessels normal  in caliber. No atherosclerotic plaque. No mediastinal or hilar masses or pathologically enlarged lymph nodes. Lungs and pleura: 3.8 mm right lower lobe nodule with an adjacent 2 mm nodule. Otherwise clear. No pleural effusion. No pneumothorax. CT ABDOMEN AND PELVIS Liver, spleen, gallbladder, pancreas, adrenal glands:  Normal. Kidneys, ureters, bladder:  Normal. Lymph nodes:  No pathologically enlarged lymph nodes. Ascites:  None. Gastrointestinal: 3.7 cm round smooth mass projects along the posterior wall of the mid stomach. No other stomach mass. Normal small bowel. Normal colon. Normal appendix. MUSCULOSKELETAL Mild degenerative changes along the thoracic spine. No osteoblastic or osteolytic lesions. IMPRESSION: 1. 3.7 cm gastric mass. This has smooth margins. 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 2. No evidence of locally invasive gastric malignancy or of metastatic disease. 3. No acute findings within the chest, abdomen or pelvis. Electronically Signed   By: Lajean Manes M.D.   On: 10/29/2015 20:49   Ct Abdomen Pelvis W Contrast  10/29/2015  CLINICAL DATA:  55 y.o. male with Gastric mass. 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 Patient reports that he started having nausea, vomiting, diarrhea at about 7 PM. He has mild abdominal discomfort. He vomited once with bright red blood and some blood clots, described as "good amount of blood". EXAM: CT CHEST, ABDOMEN, AND PELVIS WITH CONTRAST TECHNIQUE: Multidetector CT imaging of the chest, abdomen and pelvis was performed following the standard protocol during bolus administration of intravenous contrast. CONTRAST:  131mL ISOVUE-300 IOPAMIDOL (ISOVUE-300) INJECTION 61%, 67mL OMNIPAQUE IOHEXOL 300 MG/ML SOLN COMPARISON:  None. FINDINGS: CT CHEST Neck base and axilla: No mass or  adenopathy. Visualized thyroid is unremarkable. Mediastinum and hila: Heart normal in size and configuration. Great vessels normal in caliber. No atherosclerotic plaque. No mediastinal or hilar masses or pathologically enlarged lymph nodes. Lungs and pleura: 3.8 mm right lower lobe nodule with an adjacent 2 mm nodule. Otherwise clear. No pleural effusion. No pneumothorax. CT ABDOMEN AND PELVIS Liver, spleen, gallbladder, pancreas, adrenal glands:  Normal. Kidneys, ureters, bladder:  Normal. Lymph nodes:  No pathologically enlarged lymph nodes. Ascites:  None. Gastrointestinal: 3.7 cm round smooth mass projects along the posterior wall of the mid stomach. No other stomach mass. Normal small bowel. Normal colon. Normal appendix. MUSCULOSKELETAL Mild degenerative changes along the thoracic spine. No osteoblastic or osteolytic lesions. IMPRESSION: 1. 3.7 cm gastric mass. This has smooth margins. 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 2. No evidence of locally invasive gastric malignancy or of metastatic disease. 3. No acute findings within the chest, abdomen or pelvis. Electronically Signed   By: Lajean Manes M.D.   On: 10/29/2015 20:49  ASSESSMENT AND PLAN  Zekiel Ferrelli Honaker is a 55 y.o. year old male with a history of obesity, OSA on CPAP, chronic ETOH use, chronic systolic CHF and CAD with recent STEMI s/p DES to RCA (09/14/15) who presented to [facility] on 10/29/15 with N&V, diarrhea, hematemesis and cough. H&H 6.5/20.2 on admission. EGD showed multiple large gastric nodules with multiple ulcers, differential includes GIST vs gastric varices specimens collected. Cardiology asked to weigh in on DAPT and preop risk as pt may need surgery.  CAD recent stent to RCA: 2/17. Fairly large 3.5 mm DES to RCA. On 81 mg ASA. Plan to resume Brillinta once team knows what plan is for cancer May need more biopsies   CHF: EF 35-40%. +10L. Given lasix 20mg  IV x1. Weight appears stable. No s/s CHF. Continue Coreg 3.125mg  BID and  Losartan 25mg  daily.  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 However, after speaking with Dr. Saralyn Pilar, immunohistochemistry does not support this and he feels that the specimen is inadequate to make the diagnosis. Plan: Ideally, repeat EGD with EUS and biopsy of large mass and one of the smaller masses (which may just be reactive). This is planned for tomorrow. Will need to re institute Brilinta as soon as deemed safe by surgery/GI.  Acute blood loss anemia: transfused 4 units PRBC total  Signed, Eileen Stanford PA-C  Pager [phone]

## 2015-11-04 NOTE — Progress Notes (Signed)
   11/04/15 1600  Clinical Encounter Type  Visited With Patient  Visit Type Initial;Spiritual support;Psychological support  Referral From [place] [facility]otional  Ch visited with pt; discussed diagnosis and will follow-up Friday after results for tests on mass  Evaluated; pt receptive and social; spiritual and social support offered; 4:19 PM Gwynn Burly

## 2015-11-04 NOTE — Progress Notes (Signed)
Patient ID: James Mullins, male   DOB: 27-Jan-1961, 55 y.o.   MRN: [id number]    Progress Note   Subjective   Doing well- frustrated by hospitalization , no specific c/o, no abdominal pain or melena hgb 7.8 this am   Objective   Vital signs in last 24 hours: Temp:  [97.8 F (36.6 C)-98.4 F (36.9 C)] 98.4 F (36.9 C) (04/05 0500) Pulse Rate:  [55-63] 55 (04/05 0500) Resp:  [18-20] 18 (04/05 0500) BP: (114-130)/(67-75) 130/75 mmHg (04/05 0500) SpO2:  [98 %-99 %] 98 % (04/05 0500) Weight:  [270 lb 6.4 oz (122.653 kg)] 270 lb 6.4 oz (122.653 kg) (04/05 0500) Last BM Date: 10/30/15 General:    white male in NAD Heart:  Regular rate and rhythm; no murmurs Lungs: Respirations even and unlabored, lungs CTA bilaterally Abdomen:  Soft, nontender and nondistended. Normal bowel sounds. Extremities:  Without edema. Neurologic:  Alert and oriented,  grossly normal neurologically. Psych:  Cooperative. Normal mood and affect.  Intake/Output from previous day: 04/04 0701 - 04/05 0700 In: 1700 [P.O.:480; I.V.:1220] Out: -  Intake/Output this shift: Total I/O In: 240 [P.O.:240] Out: -   Lab Results:  Recent Labs  11/02/15 0441 11/03/15 0453 11/04/15 0442  WBC 3.9* 4.6 3.7*  HGB 7.9* 8.4* 7.8*  HCT 23.4* 26.0* 23.6*  PLT 180 220 218   BMET  Recent Labs  11/03/15 0453  NA 142  K 4.1  CL 111  CO2 25  GLUCOSE 110*  BUN 8  CREATININE 0.88  CALCIUM 7.9*   LFT No results for input(s): PROT, ALBUMIN, AST, ALT, ALKPHOS, BILITOT, BILIDIR, IBILI in the last 72 hours. PT/INR No results for input(s): LABPROT, INR in the last 72 hours.      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 No active bleeding off antiplatelet rx  HGB has drifted a bit Plan is for EUS and FNA of mass , and also bx  nodule(S)  Per Dr Ardis Hughs.  #2 anemia- hgb drifting  #3 CHF #4 OSA  Plan; EUS with FNA tomorrow  Would transfuse if hgb drifts below 7.5 given recent MI, and risk for ongoing oozing Decision regarding surgery vs possible medical management short term with Gleevec  Pending results of EUS, bx and immunohistology. He may be best served by wedge resection .  Principal Problem:   GI bleed Active Problems:   Obesity (BMI 30.0-34.9)   ST elevation (STEMI) myocardial infarction involving right coronary artery (HCC)   OSA (obstructive sleep apnea)   Anemia   Chronic combined systolic and diastolic CHF (congestive heart failure) (HCC)   Cough   Acute blood loss anemia   Upper GI bleed   Gastric mass   Bleeding gastrointestinal   Melena     LOS: 6 days   [name]  11/04/2015, 9:01 AM

## 2015-11-05 ENCOUNTER — Inpatient Hospital Stay (HOSPITAL_COMMUNITY): Payer: Managed Care, Other (non HMO) | Admitting: Anesthesiology

## 2015-11-05 ENCOUNTER — Encounter (HOSPITAL_COMMUNITY): Payer: Self-pay

## 2015-11-05 ENCOUNTER — Encounter (HOSPITAL_COMMUNITY)
Admission: EM | Disposition: A | Discharge status: Home / Self Care | Payer: Self-pay | Source: Home / Self Care | Attending: Internal Medicine

## 2015-11-05 DIAGNOSIS — K3189 Other diseases of stomach and duodenum: Secondary | ICD-10-CM

## 2015-11-05 DIAGNOSIS — D490 Neoplasm of unspecified behavior of digestive system: Secondary | ICD-10-CM

## 2015-11-05 DIAGNOSIS — M109 Gout, unspecified: Secondary | ICD-10-CM

## 2015-11-05 HISTORY — PX: EUS: SHX5427

## 2015-11-05 LAB — HEMOGLOBIN AND HEMATOCRIT, BLOOD
HEMATOCRIT: 28.7 % — AB (ref 39.0–52.0)
HEMOGLOBIN: 9.5 g/dL — AB (ref 13.0–17.0)

## 2015-11-05 LAB — TYPE AND SCREEN
ABO/RH(D): O POS
Antibody Screen: NEGATIVE
Unit division: 0
Unit division: 0

## 2015-11-05 LAB — CBC
HEMATOCRIT: 29.1 % — AB (ref 39.0–52.0)
HEMOGLOBIN: 9.6 g/dL — AB (ref 13.0–17.0)
MCH: 27.1 pg (ref 26.0–34.0)
MCHC: 33 g/dL (ref 30.0–36.0)
MCV: 82.2 fL (ref 78.0–100.0)
Platelets: 272 10*3/uL (ref 150–400)
RBC: 3.54 MIL/uL — ABNORMAL LOW (ref 4.22–5.81)
RDW: 16.4 % — AB (ref 11.5–15.5)
WBC: 6.7 10*3/uL (ref 4.0–10.5)

## 2015-11-05 LAB — BASIC METABOLIC PANEL
Anion gap: 7 (ref 5–15)
BUN: 11 mg/dL (ref 6–20)
CALCIUM: 8.4 mg/dL — AB (ref 8.9–10.3)
CHLORIDE: 107 mmol/L (ref 101–111)
CO2: 27 mmol/L (ref 22–32)
CREATININE: 0.92 mg/dL (ref 0.61–1.24)
GFR calc non Af Amer: >60 mL/min (ref >60)
GLUCOSE: 97 mg/dL (ref 65–99)
Potassium: 4 mmol/L (ref 3.5–5.1)
Sodium: 141 mmol/L (ref 135–145)

## 2015-11-05 LAB — GLUCOSE, CAPILLARY: GLUCOSE-CAPILLARY: 82 mg/dL (ref 65–99)

## 2015-11-05 SURGERY — ULTRASOUND, UPPER GI TRACT, ENDOSCOPIC
Anesthesia: Monitor Anesthesia Care

## 2015-11-05 MED ORDER — LIDOCAINE HCL (CARDIAC) 20 MG/ML IV SOLN
INTRAVENOUS | Status: DC | PRN
  Administered 2015-11-05: 60 mg via INTRAVENOUS

## 2015-11-05 MED ORDER — MORPHINE SULFATE (PF) 2 MG/ML IV SOLN: 2.0000 mg | INTRAVENOUS | Status: DC | PRN

## 2015-11-05 MED ORDER — PROPOFOL 10 MG/ML IV BOLUS
INTRAVENOUS | Status: AC
  Filled 2015-11-05: qty 40

## 2015-11-05 MED ORDER — PROPOFOL 10 MG/ML IV BOLUS
INTRAVENOUS | Status: AC
  Filled 2015-11-05: qty 20

## 2015-11-05 MED ORDER — PREDNISONE 20 MG PO TABS
40.0000 mg | ORAL_TABLET | Freq: Every day | ORAL | Status: DC
  Administered 2015-11-05 – 2015-11-06 (×2): 40 mg via ORAL
  Filled 2015-11-05 (×2): qty 2

## 2015-11-05 MED ORDER — HYDROCODONE-ACETAMINOPHEN 5-325 MG PO TABS
1.0000 | ORAL_TABLET | Freq: Four times a day (QID) | ORAL | Status: DC | PRN
  Administered 2015-11-05: 1 via ORAL
  Filled 2015-11-05: qty 1

## 2015-11-05 MED ORDER — MIDAZOLAM HCL 5 MG/5ML IJ SOLN
INTRAMUSCULAR | Status: DC | PRN
  Administered 2015-11-05: 2 mg via INTRAVENOUS

## 2015-11-05 MED ORDER — LIDOCAINE HCL (CARDIAC) 20 MG/ML IV SOLN
INTRAVENOUS | Status: AC
Start: 2015-11-05 — End: 2015-11-05
  Filled 2015-11-05: qty 5

## 2015-11-05 MED ORDER — PROPOFOL 10 MG/ML IV BOLUS
INTRAVENOUS | Status: DC | PRN
  Administered 2015-11-05: 110 mg via INTRAVENOUS
  Administered 2015-11-05: 30 mg via INTRAVENOUS

## 2015-11-05 MED ORDER — PHENYLEPHRINE HCL 10 MG/ML IJ SOLN
INTRAMUSCULAR | Status: DC | PRN
  Administered 2015-11-05 (×2): 40 ug via INTRAVENOUS
  Administered 2015-11-05: 80 ug via INTRAVENOUS
  Administered 2015-11-05: 40 ug via INTRAVENOUS

## 2015-11-05 MED ORDER — PROPOFOL 500 MG/50ML IV EMUL
INTRAVENOUS | Status: DC | PRN
  Administered 2015-11-05: 200 ug/kg/min via INTRAVENOUS

## 2015-11-05 MED ORDER — LACTATED RINGERS IV SOLN
INTRAVENOUS | Status: DC
  Administered 2015-11-05: 1000 mL via INTRAVENOUS

## 2015-11-05 MED ORDER — MIDAZOLAM HCL 2 MG/2ML IJ SOLN
INTRAMUSCULAR | Status: AC
  Filled 2015-11-05: qty 2

## 2015-11-05 MED ORDER — ONDANSETRON HCL 4 MG/2ML IJ SOLN
INTRAMUSCULAR | Status: AC
  Filled 2015-11-05: qty 2

## 2015-11-05 MED ORDER — ONDANSETRON HCL 4 MG/2ML IJ SOLN
INTRAMUSCULAR | Status: DC | PRN
  Administered 2015-11-05 (×2): 2 mg via INTRAVENOUS

## 2015-11-05 NOTE — Op Note (Signed)
[facility] Patient Name: Regis Mansouri Procedure Date: 11/05/2015 MRN: [id number] Attending MD: Milus Banister , MD Date of Birth: 1961/04/24 CSN:  Age: 55 Admit Type: Inpatient Procedure:                Upper EUS Indications:              Gastric mucosal mass/polyp found on endoscopy Providers:                Milus Banister, MD, Laverta Baltimore, RN,                            William Dalton, Technician Referring MD:             Denzil Magnuson, MD Medicines:                Monitored Anesthesia Care Complications:            No immediate complications. Estimated blood loss:                            Minimal. Estimated Blood Loss:     Estimated blood loss was minimal. Procedure:                Pre-Anesthesia Assessment:                           - Prior to the procedure, a History and Physical                            was performed, and patient medications and                            allergies were reviewed. The patient's tolerance of                            previous anesthesia was also reviewed. The risks                            and benefits of the procedure and the sedation                            options and risks were discussed with the patient.                            All questions were answered, and informed consent                            was obtained. Prior Anticoagulants: The patient has                            taken aspirin. ASA Grade Assessment: III - A                            patient with severe systemic disease. After  reviewing the risks and benefits, the patient was                            deemed in satisfactory condition to undergo the                            procedure.                           After obtaining informed consent, the endoscope was                            passed under direct vision. Throughout the                            procedure, the patient's blood pressure, pulse,  and                            oxygen saturations were monitored continuously. The                            [id number]) scope was introduced through                            the mouth, and advanced to the duodenal bulb. The                            [id number] scope was introduced through the                            mouth, and advanced to the duodenal bulb. The upper                            EUS was accomplished without difficulty. The                            patient tolerated the procedure well. Scope In: Scope Out: Findings:      Endosonographic Finding :      Findings:      1. There were scores of nodules in the stomach starting from just below       the GE junction to the distal stomach. The nodules ranged in size from       15mm to 4cm (including several 2-3cm nodules in more proximal stomach).       They were all soft, friable, some were frankly ulcerated, many bled with       simple contact. Following EUS evaluation and FNA, I elected to removed       one of the smaller nodules with a snare/cautery. Following resection I       placed a single endoclip at the site to treat resulting mild bleeding.      2. I biospied the normal appearing distal stomach with forceps (to check       for H. pylori)      3. With echonedoscope it was clear that none of the nodules communicated  with the muscularis propria layer of the gastric wall (making GIST       unlikely). Rather, the nodules were hypoechoic, seemed confied to the       mucosa and deep mucosal layers.      4. I used linear echoendoscope to perform EUS FNA on the largest nodule       (the 4cm round, pendunculated one that is described on previous EGD and       also very clear on CT scan). Preliminary cytology report showed blood       only. Impression:               Scores of soft, friable nodules extending from the                            GE junction to the distal stomach. The remaining                             10cm of distal stomach is normal. The nodules bleed                            with simple contact despite being off brilinta for                            about a week now. They range in size from 63mm to                            4cm and I suspect they all represent the same                            (?neoplastic) process. By EUS the nodules are                            confined to mucosa and deep mucosal layers, this                            makes GIST very unlikely. I sampled the nodules by                            EUS guided FNA (of the largest nodule) and                            snare/cautery resection (of one of the smaller                            nodules). I also biopsied the distal stomach to                            check for H. pylori infection. Await results from                            the cytology, pathology.  He has required 6 units of blood this admission. I                            suspect he will continue to ooze slowly until the                            lesions are efffectively treated and that the pace                            of bleeding will increase when blood thinners are                            resumed. He should stay on PPI twice daily for now,                            however I don't know if this will decrease any                            future bleeding. Moderate Sedation:      N/A- Per Anesthesia Care Recommendation:           - Resume previous diet. Procedure Code(s):        --- Professional ---                           [phone], Esophagogastroduodenoscopy, flexible,                            transoral; with transendoscopic ultrasound-guided                            intramural or transmural fine needle                            aspiration/biopsy(s), (includes endoscopic                            ultrasound examination limited to the esophagus,                            stomach or  duodenum, and adjacent structures) Diagnosis Code(s):        --- Professional ---                           D49.0, Neoplasm of unspecified behavior of                            digestive system                           K31.89, Other diseases of stomach and duodenum CPT copyright 2016 [employer]. All rights reserved. The codes documented in this report are preliminary and upon coder review may  be revised to meet current compliance requirements. Milus Banister, MD 11/05/2015 3:25:21 PM This report has been signed electronically. Number of Addenda: 0

## 2015-11-05 NOTE — Transfer of Care (Signed)
Immediate Anesthesia Transfer of Care Note  Patient: James Mullins  Procedure(s) Performed: Procedure(s): FULL UPPER ENDOSCOPIC ULTRASOUND (EUS) RADIAL (N/A)  Patient Location: ENDO  Anesthesia Type:MAC  Level of Consciousness:  sedated, patient cooperative and responds to stimulation  Airway & Oxygen Therapy:Patient Spontanous Breathing and Patient connected to face mask oxgen  Post-op Assessment:  Report given to ENDO RN and Post -op Vital signs reviewed and stable  Post vital signs:  Reviewed and stable  Last Vitals:  Filed Vitals:   11/05/15 0556 11/05/15 1345  BP: 126/80 145/80  Pulse: 74 64  Temp: 37.1 C 37.7 C  Resp: 18 18    Complications: No apparent anesthesia complications

## 2015-11-05 NOTE — Progress Notes (Signed)
Patient ID: James Mullins, male   DOB: [birth year], 55 y.o.   MRN: [id number]    Progress Note   Subjective   Doing well- no complaints, tolerated transfusions . No c/o CP,SOB, no bm today but stools have been brown   Objective   Vital signs in last 24 hours: Temp:  [97.8 F (36.6 C)-98.7 F (37.1 C)] 98.7 F (37.1 C) (04/06 0556) Pulse Rate:  [54-74] 74 (04/06 0556) Resp:  [18-22] 18 (04/06 0556) BP: (111-126)/(61-80) 126/80 mmHg (04/06 0556) SpO2:  [97 %-100 %] 97 % (04/06 0556) Weight:  [269 lb 10 oz (122.3 kg)] 269 lb 10 oz (122.3 kg) (04/06 0556) Last BM Date: 11/04/15 General:    white male  in NAD Heart:  Regular rate and rhythm; no murmurs Lungs: Respirations even and unlabored, lungs CTA bilaterally Abdomen:  Soft, nontender and nondistended. Normal bowel sounds. Extremities:  Without edema. Neurologic:  Alert and oriented,  grossly normal neurologically. Psych:  Cooperative. Normal mood and affect.  Intake/Output from previous day: 04/05 0701 - 04/06 0700 In: 1324 [P.O.:720; Blood:604] Out: 5 [Urine:4; Stool:1] Intake/Output this shift:    Lab Results:  Recent Labs  11/03/15 0453 11/04/15 0442 11/05/15 0049 11/05/15 0439  WBC 4.6 3.7*  --  6.7  HGB 8.4* 7.8* 9.5* 9.6*  HCT 26.0* 23.6* 28.7* 29.1*  PLT 220 218  --  272   BMET  Recent Labs  11/03/15 0453 11/05/15 0439  NA 142 141  K 4.1 4.0  CL 111 107  CO2 25 27  GLUCOSE 110* 97  BUN 8 11  CREATININE 0.88 0.92  CALCIUM 7.9* 8.4*   LFT No results for input(s): PROT, ALBUMIN, AST, ALT, ALKPHOS, BILITOT, BILIDIR, IBILI in the last 72 hours. PT/INR No results for input(s): LABPROT, INR in the last 72 hours.      Assessment / Plan:    #1 55 yo male with acute Gi bleed  While on Brillinta and ASA, s/p recent STEMI/DES RCA Bleed secondary to ulcerated gastric mass consistent with GIST, also has multiple other gastric nodules-? Inflammatory vs multifocal GIST   Initial Bx  indeterminate- For EGD/ EUS with FNA  Of mass , and bx of nodule (s)  Today per Dr Ardis Hughs  #2 anemia- secondary to blood loss- transfused x 2 yesterday - keeping  HGB above 8 given bleeding risk and  CAD with recent MI #3 CHF- stable  Principal Problem:   GI bleed Active Problems:   Obesity (BMI 30.0-34.9)   ST elevation (STEMI) myocardial infarction involving right coronary artery (HCC)   OSA (obstructive sleep apnea)   Anemia   Chronic combined systolic and diastolic CHF (congestive heart failure) (HCC)   Cough   Acute blood loss anemia   Upper GI bleed   Gastric mass   Bleeding gastrointestinal   Melena   Acute upper GI bleed     LOS: 7 days   James Mullins  11/05/2015, 9:06 AM

## 2015-11-05 NOTE — Progress Notes (Signed)
PROGRESS NOTE    James Mullins  [id number]  DOB: March 06, 1961  DOA: 10/29/2015 PCP: No PCP Per Patient Outpatient Specialists:   Hospital course: 55 year old male with a PMH of CAD, recent STEMI (hospitalized 09/12/15-09/14/15), s/p DES RCA on 09/12/15 (on aspirin and Brilinta PTA), HLD, OSA on CPAP, obesity, chronic combined systolic and diastolic CHF with EF [id number] percent, GI bleeding, iron deficiency anemia presented to ED on 10/29/15 with nausea, vomiting, diarrhea, hematemesis and cough. In the ED, Hb 6.5, down from 9 on 09/21/15. Admitted to step down for management.   Assessment & Plan:   Acute upper GI bleed/gastric masses - In the setting of aspirin and Brilinta - EGD 10/29/15 showed multiple large gastric nodules with multiple ulcers, differential includes GIST versus gastric varices. No evidence of metastasis on CT scan. - Cardiology was consulted regarding recommendations for DAPT given recent STEMI and recommended holding Brilinta but continuing low-dose aspirin 81 MG daily. As per cardiology, he is approximately 1.5 months outside of stent placement and still at high risk for stent thrombosis. If active bleeding continues, we will need to stop aspirin as well. If no surgery planned, may consider adding Plavix without loading, instead of Brilinta. Plavix may result in less bleeding side effects. Very challenging situation. Cardiology has cleared for surgery if warranted - CT abdomen 3/31 showed 3.7 cm mass posterior wall of the stomach. - Pathology was not definitive for GIST-specimen inadequate to make the diagnosis. Hence EGD/EUS and biopsies were repeated 4/6 - IV protonix was transitioned to oral - Oncology input 11/02/15 appreciated. If GIST is confirmed, patient may be a candidate for Gleevec in an attempt to keep him from needing radical surgery which carries high morbidity. - General surgery plans to discuss with IR regarding possibility of embolizing main vessel to  the large mass to decrease risk of significant bleeding during neoadjuvant Rx. - Decision regarding possible medical management short-term with Gleevec versus surgery pending definitive diagnosis. - EGD/EUS 11/05/15: Extensive nodules extending from GE junction to distal stomach, seem to be confined to mucosa and deep mucosal layers making GIST unlikely-biopsies pending, soft, friable, some frankly ulcerated and bled with minimal contact.  Acute blood loss anemia complicating severe iron deficiency anemia - Secondary to acute GI bleed. - s/p 2 units PRBCs 4/5. Hemoglobin improved to the 9 g range. - Total 6 units PRBC since admission.  OSA - Continue nightly CPAP.  CAD, recent STEMI, s/p DES to RCA 09/12/15 - Holding ASA and Brilinta d/t GIB. - Continue carvedilol and Lipitor. Asymptomatic of chest pain.  Chronic combined systolic and diastolic CHF/ischemic cardiomyopathy - 2-D echo 09/14/15: LVEF 40-45 percent and grade 2 diastolic dysfunction. - Compensated. Continue carvedilol and losartan. Not on diuretics-use when necessary and with transfusions for overload.  Acute gouty arthritis of left ankle - No NSAIDs given upper GI bleed. Trial of oral prednisone.   DVT prophylaxis: SCD's Code Status: Full Family Communication: None at bedside Disposition Plan: DC home when medically stable.   Consultants:  [facility]  Cardiology  Oncology  Procedures:  EGD 10/29/15: Impression:Multiple large gastric nodules with multiple ulcers   differential includes GIST vs gastric varices  specimens collected.  Recommendation:      - Await pathology results  -CT chest, abd & pelvis with contrast  -Monitori Hgb q8h and transfuse as needed to   mainatin Hgb >8  -PPI gtt   -NPO  -Antiplatelet therapy as per cardiology given   recent STEMI and stent placement  EGD/EUS 11/05/15  Antimicrobials:  None   Subjective: Patient was seen this morning prior to EGD. Left ankle pain started early this morning. Worse on weightbearing. States that this is similar to his gout flare which he has infrequently.  Objective: Filed Vitals:   11/05/15 1522 11/05/15 1528 11/05/15 1530 11/05/15 1548  BP: 95/64 105/64 99/64 119/70  Pulse: 79 82 62 61  Temp:      TempSrc:      Resp: 16 18 20    Height:      Weight:      SpO2: 100% 100% 100% 99%    Intake/Output Summary (Last 24 hours) at 11/05/15 1648 Last data filed at 11/05/15 1504  Gross per 24 hour  Intake   1544 ml  Output      4 ml  Net   1540 ml   Filed Weights   11/03/15 0506 11/04/15 0500 11/05/15 0556  Weight: 121.791 kg (268 lb 8 oz) 122.653 kg (270 lb 6.4 oz) 122.3 kg (269 lb 10 oz)    Exam:  General exam: Pleasant middle-aged male seen ambulating in the room with slight limp on his left leg. Respiratory system: Clear. No increased work of breathing. Cardiovascular system: S1 & S2 heard, RRR. No JVD, murmurs, gallops, clicks or pedal edema.Telemetry: SB in the 50s-SR with occasional PVCs.  Gastrointestinal system: Abdomen is nondistended, soft and nontender. Normal bowel sounds heard. Central nervous system: Alert and oriented. No focal neurological deficits. Extremities: Symmetric 5 x 5 power. Left ankle with minimal swelling, warmth and mild painful range of movements.   Data Reviewed: Basic Metabolic Panel:  Recent Labs Lab 10/31/15 0512 11/03/15 0453 11/05/15 0439  NA 139 142 141  K 3.9 4.1 4.0  CL 110 111 107  CO2 23 25 27   GLUCOSE 90 110* 97  BUN 11 8 11   CREATININE 0.73 0.88 0.92  CALCIUM 7.6* 7.9* 8.4*  MG  --  1.9  --    Liver Function Tests: No results for input(s): AST, ALT, ALKPHOS, BILITOT, PROT, ALBUMIN in the last 168  hours. No results for input(s): LIPASE, AMYLASE in the last 168 hours. No results for input(s): AMMONIA in the last 168 hours. CBC:  Recent Labs Lab 11/01/15 0505 11/02/15 0441 11/03/15 0453 11/04/15 0442 11/05/15 0049 11/05/15 0439  WBC 3.8* 3.9* 4.6 3.7*  --  6.7  HGB 8.0* 7.9* 8.4* 7.8* 9.5* 9.6*  HCT 24.1* 23.4* 26.0* 23.6* 28.7* 29.1*  MCV 84.3 83.3 85.5 83.7  --  82.2  PLT 168 180 220 218  --  272   Cardiac Enzymes: No results for input(s): CKTOTAL, CKMB, CKMBINDEX, TROPONINI in the last 168 hours. BNP (last 3 results) No results for input(s): PROBNP in the last 8760 hours. CBG:  Recent Labs Lab 11/01/15 0735 11/02/15 0813 11/03/15 0733 11/04/15 0733 11/05/15 0805  GLUCAP 77 85 93 89 82    No results found for this or any previous visit (from the past 240 hour(s)).       Studies: No results found.      Scheduled Meds: . aspirin  81 mg Oral Daily  . atorvastatin  80 mg Oral q1800  . carvedilol  3.125 mg Oral BID WC  . dextromethorphan-guaiFENesin  1 tablet Oral BID  . losartan  25 mg Oral Daily  . multivitamin with minerals  1 tablet Oral Daily  . pantoprazole  40 mg Oral BID AC  . sodium chloride flush  3 mL Intravenous Q12H   Continuous  Infusions:    Principal Problem:   GI bleed Active Problems:   Obesity (BMI 30.0-34.9)   ST elevation (STEMI) myocardial infarction involving right coronary artery (HCC)   OSA (obstructive sleep apnea)   Anemia   Chronic combined systolic and diastolic CHF (congestive heart failure) (HCC)   Cough   Acute blood loss anemia   Upper GI bleed   Gastric mass   Bleeding gastrointestinal   Melena   Acute upper GI bleed    Time spent: 20 minutes.    Vernell Leep, MD, FACP, FHM. [facility] Pager [phone]  If 7PM-7AM, please contact night-coverage [email or web address] Password [email or web address] 11/05/2015, 4:48 PM    LOS: 7 days

## 2015-11-05 NOTE — Anesthesia Preprocedure Evaluation (Signed)
Anesthesia Evaluation  Patient identified by MRN, date of birth, ID band Patient awake    Reviewed: Allergy & Precautions, NPO status , Patient's Chart, lab work & pertinent test results  Airway Mallampati: II   Neck ROM: full    Dental   Pulmonary sleep apnea ,    breath sounds clear to auscultation       Cardiovascular + CAD, + Past MI and +CHF   Rhythm:regular Rate:Normal     Neuro/Psych    GI/Hepatic   Endo/Other  Morbid obesity  Renal/GU      Musculoskeletal   Abdominal   Peds  Hematology   Anesthesia Other Findings   Reproductive/Obstetrics                             Anesthesia Physical Anesthesia Plan  ASA: III  Anesthesia Plan: MAC   Post-op Pain Management:    Induction: Intravenous  Airway Management Planned: Nasal Cannula  Additional Equipment:   Intra-op Plan:   Post-operative Plan:   Informed Consent: I have reviewed the patients History and Physical, chart, labs and discussed the procedure including the risks, benefits and alternatives for the proposed anesthesia with the patient or authorized representative who has indicated his/her understanding and acceptance.     Plan Discussed with: CRNA, Anesthesiologist and Surgeon  Anesthesia Plan Comments:         Anesthesia Quick Evaluation

## 2015-11-05 NOTE — Progress Notes (Signed)
[facility]   HEMATOLOGY/ONCOLOGY INPATIENT PROGRESS NOTE  Date of Service: 11/04/2015  Inpatient Attending: .Modena Jansky, MD   SUBJECTIVE  Patient was seen in the evening with his wife at bedside. The current challenges in his treatment were discussed in details. We discussed the fact that he does need a definitive diagnosis prior to being able to potentially offering him neoadjuvant imatinib. He and his wife are very appreciative of the sumative evaluation and discussion. No chest pain or shortness of breath. Notes melena or hematochezia. No hematemesis. He is to receive 2 units of PRBCs today. Plan to have endoscopic ultrasound with biopsies tomorrow.  OBJECTIVE:  NAD  PHYSICAL EXAMINATION: . Filed Vitals:   11/04/15 2014 11/04/15 2043 11/04/15 2245 11/05/15 0556  BP: 120/65 125/66 113/61 126/80  Pulse: 64 62 54 74  Temp: 98.2 F (36.8 C) 98.2 F (36.8 C) 97.8 F (36.6 C) 98.7 F (37.1 C)  TempSrc: Oral Oral Oral Oral  Resp: _0 Height:      Weight:    269 lb 10 oz (122.3 kg)  SpO2: 98% 97% 100% 97%   Filed Weights   11/03/15 0506 11/04/15 0500 11/05/15 0556  Weight: 268 lb 8 oz (121.791 kg) 270 lb 6.4 oz (122.653 kg) 269 lb 10 oz (122.3 kg)   .Body mass index is 39.8 kg/(m^2).  GENERAL:alert, in no acute distress and comfortable SKIN: skin color, texture, turgor are normal, no rashes or significant lesions EYES: normal, conjunctiva are pink and non-injected, sclera clear OROPHARYNX:no exudate, no erythema and lips, buccal mucosa, and tongue normal  NECK: supple, no JVD, thyroid normal size, non-tender, without nodularity LYMPH:  no palpable lymphadenopathy in the cervical, axillary or inguinal LUNGS: clear to auscultation with normal respiratory effort HEART: regular rate & rhythm,  no murmurs and no lower extremity edema ABDOMEN: abdomen soft, non-tender, normoactive bowel sounds  Musculoskeletal: no cyanosis of digits and no clubbing  PSYCH: alert &  oriented x 3 with fluent speech NEURO: no focal motor/sensory deficits  MEDICAL HISTORY:  Past Medical History  Diagnosis Date  . Obesity (BMI 30.0-34.9) 2015  . OSA on CPAP 08/2013    snoring. sleep study confirmed.  Dr Asencion Partridge Dohmeier  . CAD (coronary artery disease) 09/2015  . MI (myocardial infarction) ([facility]) 09/2015  . Chronic combined systolic and diastolic CHF (congestive heart failure) ([facility]) 09/2015    ef 40 to 45%,  grade 2 diastolic dysfunction.   . Iron deficiency anemia 09/2015  . Upper GI bleed 10/29/15    EGD: multiple subepithelial gastric nodules with overlying multiple, oozing ulcers.  GIST vs gastric varices.     SURGICAL HISTORY: Past Surgical History  Procedure Laterality Date  . Cardiac catheterization N/A 09/12/2015    Left Heart Cath and Coronary Angiography; Dr Peter M Martinique, MD;   . Cardiac catheterization N/A 09/12/2015    placement Promus stent to mid RCA, Dr Peter M Martinique, MD  . Esophagogastroduodenoscopy N/A 10/29/2015    Procedure: ESOPHAGOGASTRODUODENOSCOPY (EGD);  Surgeon: Mauri Pole, MD;  Location: [facility];  Service: Endoscopy;  Laterality: N/A;    SOCIAL HISTORY: Social History   Social History  . Marital Status: Married    Spouse Name: renee  . Number of Children: 1  . Years of Education: college   Occupational History  . 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    Social History Main Topics  . Smoking status:  Never Smoker   . Smokeless tobacco: Current User    Types: Chew  . Alcohol Use: Yes     Comment: couple of 6 packs on the weekends  . Drug Use: No  . Sexual Activity: Not on file   Other Topics Concern  . Not on file   Social History Narrative    FAMILY HISTORY: Family History  Problem Relation Age of Onset  . Stroke Father   . Colon cancer Mother     ALLERGIES:  has No Known Allergies.  MEDICATIONS:  Scheduled Meds: . aspirin  81 mg Oral Daily  . atorvastatin  80 mg  Oral q1800  . carvedilol  3.125 mg Oral BID WC  . dextromethorphan-guaiFENesin  1 tablet Oral BID  . losartan  25 mg Oral Daily  . multivitamin with minerals  1 tablet Oral Daily  . pantoprazole  40 mg Oral BID AC  . sodium chloride flush  3 mL Intravenous Q12H   Continuous Infusions:  PRN Meds:.acetaminophen **OR** acetaminophen, nitroGLYCERIN, ondansetron **OR** ondansetron (ZOFRAN) IV  REVIEW OF SYSTEMS:    10 Point review of Systems was done is negative except as noted above.   LABORATORY DATA:  I have reviewed the data as listed  . CBC Latest Ref Rng 11/05/2015 11/05/2015 11/04/2015  WBC 4.0 - 10.5 K/uL 6.7 - 3.7(L)  Hemoglobin 13.0 - 17.0 g/dL 9.6(L) 9.5(L) 7.8(L)  Hematocrit 39.0 - 52.0 % 29.1(L) 28.7(L) 23.6(L)  Platelets 150 - 400 K/uL 272 - 218    . CMP Latest Ref Rng 11/05/2015 11/03/2015 10/31/2015  Glucose 65 - 99 mg/dL 97 110(H) 90  BUN 6 - 20 mg/dL _0 Creatinine 0.61 - 1.24 mg/dL 0.92 0.88 0.73  Sodium 135 - 145 mmol/L 141 142 139  Potassium 3.5 - 5.1 mmol/L 4.0 4.1 3.9  Chloride 101 - 111 mmol/L 107 111 110  CO2 22 - 32 mmol/L _1 Calcium 8.9 - 10.3 mg/dL 8.4(L) 7.9(L) 7.6(L)  Total Protein 6.5 - 8.1 g/dL - - -  Total Bilirubin 0.3 - 1.2 mg/dL - - -  Alkaline Phos 38 - 126 U/L - - -  AST 15 - 41 U/L - - -  ALT 17 - 63 U/L - - -     RADIOGRAPHIC STUDIES: I have personally reviewed the radiological images as listed and agreed with the findings in the report. Dg Chest 2 View  10/29/2015  CLINICAL DATA:  Chest pain, shortness of breath. EXAM: CHEST  2 VIEW COMPARISON:  September 12, 2015. FINDINGS: The heart size and mediastinal contours are within normal limits. Both lungs are clear. No pneumothorax or pleural effusion is noted. The visualized skeletal structures are unremarkable. IMPRESSION: No active cardiopulmonary disease. Electronically Signed   By: Marijo Conception, M.D.   On: 10/29/2015 10:07   Ct Chest W Contrast  10/29/2015  CLINICAL DATA:  55  y.o. male with Gastric mass. 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 Patient reports that he started having nausea, vomiting, diarrhea at about 7 PM. He has mild abdominal discomfort. He vomited once with bright red blood and some blood clots, described as "good amount of blood". EXAM: CT CHEST, ABDOMEN, AND PELVIS WITH CONTRAST TECHNIQUE: Multidetector CT imaging of the chest, abdomen and pelvis was performed following the standard protocol during bolus  administration of intravenous contrast. CONTRAST:  128m ISOVUE-300 IOPAMIDOL (ISOVUE-300) INJECTION 61%, 512mOMNIPAQUE IOHEXOL 300 MG/ML SOLN COMPARISON:  None. FINDINGS: CT CHEST Neck base and axilla: No mass or adenopathy. Visualized thyroid is unremarkable. Mediastinum and hila: Heart normal in size and configuration. Great vessels normal in caliber. No atherosclerotic plaque. No mediastinal or hilar masses or pathologically enlarged lymph nodes. Lungs and pleura: 3.8 mm right lower lobe nodule with an adjacent 2 mm nodule. Otherwise clear. No pleural effusion. No pneumothorax. CT ABDOMEN AND PELVIS Liver, spleen, gallbladder, pancreas, adrenal glands:  Normal. Kidneys, ureters, bladder:  Normal. Lymph nodes:  No pathologically enlarged lymph nodes. Ascites:  None. Gastrointestinal: 3.7 cm round smooth mass projects along the posterior wall of the mid stomach. No other stomach mass. Normal small bowel. Normal colon. Normal appendix. MUSCULOSKELETAL Mild degenerative changes along the thoracic spine. No osteoblastic or osteolytic lesions. IMPRESSION: 1. 3.7 cm gastric mass. This has smooth margins. 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 2. No evidence of locally invasive gastric malignancy or of metastatic disease. 3. No acute findings within the chest, abdomen or pelvis.  Electronically Signed   By: DaLajean Manes.D.   On: 10/29/2015 20:49   Ct Abdomen Pelvis W Contrast  10/29/2015  CLINICAL DATA:  [age].o. male with Gastric mass. 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 Patient reports that he started having nausea, vomiting, diarrhea at about 7 PM. He has mild abdominal discomfort. He vomited once with bright red blood and some blood clots, described as "good amount of blood". EXAM: CT CHEST, ABDOMEN, AND PELVIS WITH CONTRAST TECHNIQUE: Multidetector CT imaging of the chest, abdomen and pelvis was performed following the standard protocol during bolus administration of intravenous contrast. CONTRAST:  10026mSOVUE-300 IOPAMIDOL (ISOVUE-300) INJECTION 61%, 33m7mNIPAQUE IOHEXOL 300 MG/ML SOLN COMPARISON:  None. FINDINGS: CT CHEST Neck base and axilla: No mass or adenopathy. Visualized thyroid is unremarkable. Mediastinum and hila: Heart normal in size and configuration. Great vessels normal in caliber. No atherosclerotic plaque. No mediastinal or hilar masses or pathologically enlarged lymph nodes. Lungs and pleura: 3.8 mm right lower lobe nodule with an adjacent 2 mm nodule. Otherwise clear. No pleural effusion. No pneumothorax. CT ABDOMEN AND PELVIS Liver, spleen, gallbladder, pancreas, adrenal glands:  Normal. Kidneys, ureters, bladder:  Normal. Lymph nodes:  No pathologically enlarged lymph nodes. Ascites:  None. Gastrointestinal: 3.7 cm round smooth mass projects along the posterior wall of the mid stomach. No other stomach mass. Normal small bowel. Normal colon. Normal appendix. MUSCULOSKELETAL Mild degenerative changes along the thoracic spine. No osteoblastic or osteolytic lesions. IMPRESSION: 1. 3.7 cm gastric mass. This has smooth margins. 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 2. No  evidence of locally invasive gastric malignancy or of metastatic disease. 3. No acute findings within the chest, abdomen or pelvis. Electronically Signed   By: DaviLajean Manes.   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 Could be multifocal based on endoscopic evaluation. Pathology was not definitive for GIST based on sampling. Likely inadequate tissue for KIT and PDGFRA mutation testing. ?mitotic index.  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 Patient was on aspirin and Brillinta. Currently off Brillinta due to GI bleeding.  #4 acute blood loss anemia related to upper GI bleeding  #5Obstructive sleep apnea on CPAP  #6 hypertension  #7 dyslipidemia  Plan -Surgery team appears to be leaning towards consideration of neoadjuvant imatinib if this is confirmed to be a gastrointestinal stromal tumor. -Patient is getting transfused today -Plan for endoscopic ultrasound-guided biopsy tomorrow . Will need Mutation studies, PDGFRA mutation studies if GIST. -Patient currently only on aspirin will need to watch once he is rechallenged with his second antiplatelet therapy as per cardiology and at time point after his biopsy as defined by GI . -Patient will need outpatient PET CT scan since this is a better marker for treatment response than size alone as defined by CT scan. -I will be happy to see him in clinic the week of 11/16/2015.  -I'll be out of town next week. Please call the oncologist on call if any new acute concerns arise.  I spent 35 minutes counseling the patient face to face. The total time spent in the appointment was 35 minutes and more than 50% was on counseling and direct patient cares.    Sullivan Lone MD [place] AAHIVMS [facility] Hematology/Oncology Physician [facility]  (Office):       [phone] (Work cell):  [phone] (Fax):           [phone]

## 2015-11-05 NOTE — Progress Notes (Signed)
Patient Name: James Mullins Date of Encounter: 11/05/2015     Principal Problem:   GI bleed Active Problems:   Obesity (BMI 30.0-34.9)   ST elevation (STEMI) myocardial infarction involving right coronary artery (HCC)   OSA (obstructive sleep apnea)   Anemia   Chronic combined systolic and diastolic CHF (congestive heart failure) (HCC)   Cough   Acute blood loss anemia   Upper GI bleed   Gastric mass   Bleeding gastrointestinal   Melena   Acute upper GI bleed    SUBJECTIVE  Awaiting EGD, EUS and biopsy today. Only complaint is ankle pain that he thinks is a gout flare.   CURRENT MEDS . aspirin  81 mg Oral Daily  . atorvastatin  80 mg Oral q1800  . carvedilol  3.125 mg Oral BID WC  . dextromethorphan-guaiFENesin  1 tablet Oral BID  . losartan  25 mg Oral Daily  . multivitamin with minerals  1 tablet Oral Daily  . pantoprazole  40 mg Oral BID AC  . sodium chloride flush  3 mL Intravenous Q12H    OBJECTIVE  Filed Vitals:   11/04/15 2014 11/04/15 2043 11/04/15 2245 11/05/15 0556  BP: 120/65 125/66 113/61 126/80  Pulse: 64 62 54 74  Temp: 98.2 F (36.8 C) 98.2 F (36.8 C) 97.8 F (36.6 C) 98.7 F (37.1 C)  TempSrc: Oral Oral Oral Oral  Resp: 20 18 18 18   Height:      Weight:    269 lb 10 oz (122.3 kg)  SpO2: 98% 97% 100% 97%    Intake/Output Summary (Last 24 hours) at 11/05/15 0812 Last data filed at 11/05/15 0616  Gross per 24 hour  Intake   1324 ml  Output      5 ml  Net   1319 ml   Filed Weights   11/03/15 0506 11/04/15 0500 11/05/15 0556  Weight: 268 lb 8 oz (121.791 kg) 270 lb 6.4 oz (122.653 kg) 269 lb 10 oz (122.3 kg)    PHYSICAL EXAM  General: Pleasant, NAD. obese Neuro: Alert and oriented X 3. Moves all extremities spontaneously. Psych: Normal affect. HEENT:  Normal  Neck: Supple without bruits or JVD. Lungs:  Resp regular and unlabored, CTA. Heart: RRR no s3, s4, or murmurs. Abdomen: Soft, non-tender, non-distended, BS + x 4.    Extremities: No clubbing, cyanosis or edema. DP/PT/Radials 2+ and equal bilaterally. Left ankle slighlty swollen  Accessory Clinical Findings  CBC  Recent Labs  11/04/15 0442 11/05/15 0049 11/05/15 0439  WBC 3.7*  --  6.7  HGB 7.8* 9.5* 9.6*  HCT 23.6* 28.7* 29.1*  MCV 83.7  --  82.2  PLT 218  --  [id number]   Basic Metabolic Panel  Recent Labs  11/03/15 0453 11/05/15 0439  NA 142 141  K 4.1 4.0  CL 111 107  CO2 25 27  GLUCOSE 110* 97  BUN 8 11  CREATININE 0.88 0.92  CALCIUM 7.9* 8.4*  MG 1.9  --     TELE  NSR with some PACs and PVCs. Some sinus brady overnight.   Radiology/Studies  Dg Chest 2 View  10/29/2015  CLINICAL DATA:  Chest pain, shortness of breath. EXAM: CHEST  2 VIEW COMPARISON:  September 12, 2015. FINDINGS: The heart size and mediastinal contours are within normal limits. Both lungs are clear. No pneumothorax or pleural effusion is noted. The visualized skeletal structures are unremarkable. IMPRESSION: No active cardiopulmonary disease. Electronically Signed   By: Sabino Dick  Brooke Bonito, M.D.   On: 10/29/2015 10:07   Ct Chest W Contrast  10/29/2015  CLINICAL DATA:  55 y.o. male with Gastric mass. 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 Patient reports that he started having nausea, vomiting, diarrhea at about 7 PM. He has mild abdominal discomfort. He vomited once with bright red blood and some blood clots, described as "good amount of blood". EXAM: CT CHEST, ABDOMEN, AND PELVIS WITH CONTRAST TECHNIQUE: Multidetector CT imaging of the chest, abdomen and pelvis was performed following the standard protocol during bolus administration of intravenous contrast. CONTRAST:  124mL ISOVUE-300 IOPAMIDOL (ISOVUE-300) INJECTION 61%, 17mL OMNIPAQUE IOHEXOL 300 MG/ML SOLN COMPARISON:  None.  FINDINGS: CT CHEST Neck base and axilla: No mass or adenopathy. Visualized thyroid is unremarkable. Mediastinum and hila: Heart normal in size and configuration. Great vessels normal in caliber. No atherosclerotic plaque. No mediastinal or hilar masses or pathologically enlarged lymph nodes. Lungs and pleura: 3.8 mm right lower lobe nodule with an adjacent 2 mm nodule. Otherwise clear. No pleural effusion. No pneumothorax. CT ABDOMEN AND PELVIS Liver, spleen, gallbladder, pancreas, adrenal glands:  Normal. Kidneys, ureters, bladder:  Normal. Lymph nodes:  No pathologically enlarged lymph nodes. Ascites:  None. Gastrointestinal: 3.7 cm round smooth mass projects along the posterior wall of the mid stomach. No other stomach mass. Normal small bowel. Normal colon. Normal appendix. MUSCULOSKELETAL Mild degenerative changes along the thoracic spine. No osteoblastic or osteolytic lesions. IMPRESSION: 1. 3.7 cm gastric mass. This has smooth margins. 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 2. No evidence of locally invasive gastric malignancy or of metastatic disease. 3. No acute findings within the chest, abdomen or pelvis. Electronically Signed   By: Lajean Manes M.D.   On: 10/29/2015 20:49   Ct Abdomen Pelvis W Contrast  10/29/2015  CLINICAL DATA:  55 y.o. male with Gastric mass. 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 Patient reports that he started having nausea, vomiting, diarrhea at about 7 PM. He has mild abdominal discomfort. He vomited once with bright red blood and some blood clots, described as "good amount of blood". EXAM: CT CHEST, ABDOMEN, AND PELVIS WITH CONTRAST TECHNIQUE: Multidetector CT imaging of the chest, abdomen and pelvis was performed following the standard protocol during bolus administration of intravenous  contrast. CONTRAST:  14mL ISOVUE-300 IOPAMIDOL (ISOVUE-300) INJECTION 61%, 1mL OMNIPAQUE IOHEXOL 300 MG/ML SOLN COMPARISON:  None. FINDINGS: CT CHEST Neck base and axilla: No mass or adenopathy. Visualized thyroid is unremarkable. Mediastinum and hila: Heart normal in size and configuration. Great vessels normal in caliber. No atherosclerotic plaque. No mediastinal or hilar masses or pathologically enlarged lymph nodes. Lungs and pleura: 3.8 mm right lower lobe nodule with an adjacent 2 mm nodule. Otherwise clear. No pleural effusion. No pneumothorax. CT ABDOMEN AND PELVIS Liver, spleen, gallbladder, pancreas, adrenal glands:  Normal. Kidneys, ureters, bladder:  Normal. Lymph nodes:  No pathologically enlarged lymph nodes. Ascites:  None. Gastrointestinal: 3.7 cm round smooth mass projects along the posterior wall of the mid stomach. No other stomach mass. Normal small bowel. Normal colon. Normal appendix. MUSCULOSKELETAL Mild degenerative changes along the thoracic spine. No osteoblastic or osteolytic lesions. IMPRESSION: 1. 3.7 cm gastric mass. This  has smooth margins. 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 2. No evidence of locally invasive gastric malignancy or of metastatic disease. 3. No acute findings within the chest, abdomen or pelvis. Electronically Signed   By: Lajean Manes M.D.   On: 10/29/2015 20:49    ASSESSMENT AND PLAN  Chick Felgar Desire is a 55 y.o. year old male with a history of obesity, OSA on CPAP, chronic ETOH use, chronic systolic CHF and CAD with recent STEMI s/p DES to RCA (09/14/15) who presented to [facility] on 10/29/15 with N&V, diarrhea, hematemesis and cough. H&H 6.5/20.2 on admission. EGD showed multiple large gastric nodules with multiple ulcers, differential includes GIST vs gastric varices specimens collected. Cardiology asked to weigh in on DAPT and preop risk as pt may need surgery.  CAD recent stent to RCA: 2/17. Fairly large 3.5 mm DES to RCA. On 81 mg ASA. Plan to resume Brillinta  once team knows what plan is for cancer.  Possibly start tomorrow after repeat biopsy  Chronic systolic CHF: EF 123456. +1.7L. Given lasix 20mg  IV x1. Weight appears stable. No s/s CHF. Continue Coreg 3.125mg  BID and Losartan 25mg  daily.  GI: Path report with ulcer ? 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 Apparently Dr Barry Dienes to see at Dr Bertrum Sol request. Need to expedite plans to DAT can be reistituted. EGD and EUS and biopsies today.   Acute blood loss anemia: transfused 6 units PRBC total, transfused 2 units yesterday with good response   Possible gout flare: in left ankle. Has a history of gout. Per IM  Jenkins Rouge

## 2015-11-05 NOTE — Progress Notes (Signed)
[facility] Progress Note  7 Days Post-Op  Subjective: Feels fine, but unfortunately developed gout in his left ankle overnight.  He's having trouble walking on it.  No abdominal pain or distension.  No N/V.  NPO for procedure.  Ambulating well despite gout.  No BM overnight, but passing flatus.  Urinating well.  He says he hasn't had a gouty flair in quite a while because he knows what usually flairs it up.  He does food avoidance.  Objective: Vital signs in last 24 hours: Temp:  [97.8 F (36.6 C)-98.7 F (37.1 C)] 98.7 F (37.1 C) (04/06 0556) Pulse Rate:  [54-74] 74 (04/06 0556) Resp:  [18-22] 18 (04/06 0556) BP: (111-126)/(61-80) 126/80 mmHg (04/06 0556) SpO2:  [97 %-100 %] 97 % (04/06 0556) Weight:  [122.3 kg (269 lb 10 oz)] 122.3 kg (269 lb 10 oz) (04/06 0556) Last BM Date: 11/04/15  Intake/Output from previous day: 04/05 0701 - 04/06 0700 In: 1324 [P.O.:720; Blood:604] Out: 5 [Urine:4; Stool:1] Intake/Output this shift:    PE: Gen:  Alert, NAD, pleasant Card:  RRR, no M/G/R heard Pulm:  CTA, no W/R/R Abd: Soft, NT/ND, +BS, no HSM Ext:  Left ankle edematous and erythematous   Lab Results:   Recent Labs  11/04/15 0442 11/05/15 0049 11/05/15 0439  WBC 3.7*  --  6.7  HGB 7.8* 9.5* 9.6*  HCT 23.6* 28.7* 29.1*  PLT 218  --  272   BMET  Recent Labs  11/03/15 0453 11/05/15 0439  NA 142 141  K 4.1 4.0  CL 111 107  CO2 25 27  GLUCOSE 110* 97  BUN 8 11  CREATININE 0.88 0.92  CALCIUM 7.9* 8.4*   PT/INR No results for input(s): LABPROT, INR in the last 72 hours. CMP     Component Value Date/Time   NA 141 11/05/2015 0439   K 4.0 11/05/2015 0439   CL 107 11/05/2015 0439   CO2 27 11/05/2015 0439   GLUCOSE 97 11/05/2015 0439   BUN 11 11/05/2015 0439   CREATININE 0.92 11/05/2015 0439   CALCIUM 8.4* 11/05/2015 0439   PROT 5.0* 10/29/2015 1010   ALBUMIN 2.8* 10/29/2015 1010   AST 19 10/29/2015 1010   ALT 17 10/29/2015 1010   ALKPHOS 45  10/29/2015 1010   BILITOT 0.2* 10/29/2015 1010   GFRNONAA >60 11/05/2015 0439   GFRAA >60 11/05/2015 0439   Lipase  No results found for: LIPASE     Studies/Results: No results found.  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 However, after speaking with Dr. Saralyn Pilar, immunohistochemistry does not support this and he feels that the specimen is inadequate to make the diagnosis. Both surgical oncologists in town (Dr. Barry Dienes and [place]) feel that if the large tumor is a GIST and cKIT positive, neoadjuvant Gleevac should be tried due to his increased operative risk from recent MI -Stable. No evidence of active bleeding.  -Pending EGD/EUS for biopsies today.  Please make sure to copy Dr. Claudette Laws, at his request, on his EUS report for purposes of the biopsy results.  New Left ankle gouty flair - would avoid steroids in his case if possible due to delayed healing for potential surgery.  May need crutches. CAD/NSTEMI-stent February 2017. On ASA. Brilinta last dose 1140 3/31 Obesity (BMI 30.0-34.9)  ST elevation (STEMI)  OSA (obstructive  sleep apnea) Anemia Chronic combined systolic and diastolic    LOS: 7 days    Nat Christen 11/05/2015, 9:22 AM Pager: [phone]  (7am - 4:30pm M-F; 7am - 11:30am Sa/Su)

## 2015-11-05 NOTE — H&P (View-Only) (Signed)
Patient ID: James Mullins, male   DOB: [birth year], 55 y.o.   MRN: [id number]   Discussed symptomatic anemia in pt one month post MI, and scheduled for BX tomorrow which may cause increase in bleeding- will transfuse  x 2 today  hgb 7.8 this am repeat hgb in am

## 2015-11-05 NOTE — Interval H&P Note (Signed)
History and Physical Interval Note:  11/05/2015 1:43 PM  James Mullins  has presented today for surgery, with the diagnosis of gastric tumor  The various methods of treatment have been discussed with the patient and family. After consideration of risks, benefits and other options for treatment, the patient has consented to  Procedure(s): FULL UPPER ENDOSCOPIC ULTRASOUND (EUS) RADIAL (N/A) as a surgical intervention .  The patient's history has been reviewed, patient examined, no change in status, stable for surgery.  I have reviewed the patient's chart and labs.  Questions were answered to the patient's satisfaction.     Milus Banister

## 2015-11-06 ENCOUNTER — Encounter (HOSPITAL_COMMUNITY): Payer: Self-pay | Admitting: Gastroenterology

## 2015-11-06 DIAGNOSIS — K317 Polyp of stomach and duodenum: Secondary | ICD-10-CM

## 2015-11-06 LAB — CBC
HEMATOCRIT: 31.4 % — AB (ref 39.0–52.0)
Hemoglobin: 10.1 g/dL — ABNORMAL LOW (ref 13.0–17.0)
MCH: 27.4 pg (ref 26.0–34.0)
MCHC: 32.2 g/dL (ref 30.0–36.0)
MCV: 85.3 fL (ref 78.0–100.0)
Platelets: 290 10*3/uL (ref 150–400)
RBC: 3.68 MIL/uL — ABNORMAL LOW (ref 4.22–5.81)
RDW: 16.5 % — AB (ref 11.5–15.5)
WBC: 5.9 10*3/uL (ref 4.0–10.5)

## 2015-11-06 LAB — GLUCOSE, CAPILLARY: Glucose-Capillary: 103 mg/dL — ABNORMAL HIGH (ref 65–99)

## 2015-11-06 MED ORDER — PREDNISONE 10 MG PO TABS: ORAL_TABLET | ORAL | Status: DC

## 2015-11-06 MED ORDER — CLOPIDOGREL BISULFATE 75 MG PO TABS: 75.0000 mg | ORAL_TABLET | Freq: Every day | ORAL | Status: DC

## 2015-11-06 MED ORDER — LOSARTAN POTASSIUM 25 MG PO TABS: 25.0000 mg | ORAL_TABLET | Freq: Every day | ORAL | Status: DC

## 2015-11-06 MED ORDER — PANTOPRAZOLE SODIUM 40 MG PO TBEC: 40.0000 mg | DELAYED_RELEASE_TABLET | Freq: Two times a day (BID) | ORAL | Status: DC

## 2015-11-06 MED ORDER — ACETAMINOPHEN 325 MG PO TABS: 650.0000 mg | ORAL_TABLET | Freq: Four times a day (QID) | ORAL | Status: DC | PRN

## 2015-11-06 MED ORDER — FERROUS SULFATE 325 (65 FE) MG PO TABS: 325.0000 mg | ORAL_TABLET | Freq: Three times a day (TID) | ORAL | Status: DC

## 2015-11-06 MED FILL — CLOPIDOGREL 75 MG TABLET: 75 | 30 days supply | Qty: 30 | Fill #0

## 2015-11-06 MED FILL — PANTOPRAZOLE SOD DR 40 MG T: 40 | 30 days supply | Qty: 60 | Fill #0

## 2015-11-06 MED FILL — LOSARTAN POTASSIUM 25 MG TA: 25 | 30 days supply | Qty: 30 | Fill #0

## 2015-11-06 NOTE — Discharge Summary (Signed)
Physician Discharge Summary  James Mullins  GGY:[id number]  DOB: January 18, 1961  DOA: 10/29/2015  PCP: James Bellows, MD  Admit date: 10/29/2015 Discharge date: 11/06/2015  Time spent: Greater than 30 minutes  Recommendations for Outpatient Follow-up:  1. Dr. Maurice Mullins, PCP on 11/10/15 at 2 PM. To be seen with repeat labs (CBC & BMP). Patient will need twice a week CBC check to ensure stability of hemoglobin/anemia. 2. Dr. Peter Mullins, Cardiology: Keep previous appointment for 11/20/15 at 8:15 AM. 3. Dr. Harl Mullins, [facility]: MDs office will arrange follow-up 4. Dr. Stark Mullins, General Surgery: MDs office will arrange follow-up.  Discharge Diagnoses:  Principal Problem:   GI bleed Active Problems:   Obesity (BMI 30.0-34.9)   ST elevation (STEMI) myocardial infarction involving right coronary artery (HCC)   OSA (obstructive sleep apnea)   Anemia   Chronic combined systolic and diastolic CHF (congestive heart failure) (HCC)   Cough   Acute blood loss anemia   Upper GI bleed   Gastric mass   Bleeding gastrointestinal   Melena   Acute upper GI bleed   Discharge Condition: Improved & Stable  Diet recommendation: Heart healthy diet.  Filed Weights   11/04/15 0500 11/05/15 0556 11/06/15 0440  Weight: 122.653 kg (270 lb 6.4 oz) 122.3 kg (269 lb 10 oz) 120.294 kg (265 lb 3.2 oz)    History of present illness:  55 year old male with a PMH of CAD, recent STEMI (hospitalized 09/12/15-09/14/15), s/p DES RCA on 09/12/15 (on aspirin and Brilinta PTA), HLD, OSA on CPAP, obesity, chronic combined systolic and diastolic CHF with EF 03-50 percent, GI bleeding, iron deficiency anemia presented to ED on 10/29/15 with nausea, vomiting, diarrhea, hematemesis and cough. In the ED, Hb 6.5, down from 9 on 09/21/15. Admitted to step down for management.  Hospital Course:  Acute upper GI bleed/Extensive gastric polyps - In the setting of aspirin and Brilinta - EGD 10/29/15 showed multiple  large gastric nodules with multiple ulcers, differential included GIST versus gastric varices. No evidence of metastasis on CT scan. - Cardiology was consulted regarding recommendations for DAPT given recent STEMI and recommended holding Brilinta but continuing low-dose aspirin 81 MG daily. As per cardiology, he is approximately 1.5 months outside of stent placement and still at high risk for stent thrombosis. If active bleeding continues, we will need to stop aspirin as well. If no surgery planned, may consider adding Plavix without loading, instead of Brilinta. Plavix may result in less bleeding side effects. Very challenging situation. Cardiology has cleared for surgery if warranted - CT abdomen 3/31 showed 3.7 cm mass posterior wall of the stomach. - Pathology was not definitive for GIST-specimen inadequate to make the diagnosis. Hence EGD/EUS and biopsies were repeated 4/6 - IV protonix was transitioned to oral - Oncology input 11/02/15 appreciated. If GIST is confirmed, patient may be a candidate for Gleevec in an attempt to keep him from needing radical surgery which carries high morbidity. - EGD/EUS 11/05/15: Extensive nodules extending from GE junction to distal stomach, seem to be confined to mucosa and deep mucosal layers making GIST unlikely-biopsies pending, soft, friable, some frankly ulcerated and bled with minimal contact. - Discussed at length with James Mullins, CCS & James Mullins, GI. James Mullins discussed patient's condition with his partner Dr. Barry Mullins and with Dr. Cathie Mullins (surgical oncologist at [facility]). Dr. Olen Mullins recommended continued treatment with single agent antiplatelet therapy for his DES due to risk of stent occlusion, twice a day PPI, soft diet, iron  supplements 3 times a day with meals and hemoglobin checks twice a week. This plan to continue for at least 6 months post cardiac stent placement before considering major surgery. However if he continued to have significant  bleeding, he will be at high risk for major surgery. James Mullins indicated that patient may need colonoscopy if pathology shows adenomatous polyps to rule out colonic polyposis. - James Mullins, James Mullins & myself discussed at length with patient and his spouse at bedside in room. Updated care and answered questions. Advised regarding care plan as above. Counseled patient that he should not use NSAIDs, alcohol or tobacco. Case management was able to arrange new PCP appointment for close lab monitoring for anemia. Discussed with Dr. Johnsie Mullins, Cardiology who advised that if single antiplatelet is to be used and he recommends Plavix over aspirin as per earlier recommendations. Same was discussed with general surgery and GI who were okay with Plavix. - Discussed with Dr. Orene Mullins, pathology who stated that preliminary results suggested hyperplastic polyps.  Acute blood loss anemia complicating severe iron deficiency anemia - Secondary to acute GI bleed. - Patient was transfused total of 6 units of PRBCs during course of hospitalization. Hemoglobin improved to 10.1 on day of discharge. Continue iron supplements. - Arrange for outpatient follow-up with PCP for close monitoring of CBCs. Patient was counseled regarding monitoring for further GI bleed i.e. symptoms and signs and of symptomatic anemia.  OSA - Continue nightly CPAP.  CAD, recent STEMI, s/p DES to RCA 09/12/15 - Discontinued ASA and Brilinta d/t GIB. - Continue carvedilol and Lipitor. Asymptomatic of chest pain. - As discussed with cardiology, antiplatelet was switched from aspirin to Plavix at discharge.  Chronic combined systolic and diastolic CHF/ischemic cardiomyopathy - 2-D echo 09/14/15: LVEF 40-45 percent and grade 2 diastolic dysfunction. - Compensated. Continue carvedilol and losartan. Not on diuretics-use when necessary and with transfusions for overload.  Acute gouty arthritis of left ankle - No NSAIDs given upper GI bleed. Improved  after oral prednisone-taper on discharge.   Consultants:  [facility]  Cardiology  Oncology  General surgery.  Procedures:  EGD 10/29/15: Impression:Multiple large gastric nodules with multiple ulcers   differential includes GIST vs gastric varices  specimens collected.  Recommendation:   - Await pathology results  -CT chest, abd & pelvis with contrast  -Monitori Hgb q8h and transfuse as needed to   mainatin Hgb >8  -PPI gtt  -NPO  -Antiplatelet therapy as per cardiology given   recent STEMI and stent placement  EGD/EUS 11/05/15   Pathology  10/29/2015: Diagnosis Stomach, biopsy, mass - ULCER WITH ASSOCIATED SPINDLE CELL PROLIFERATION. - SEE MICROSCOPIC DESCRIPTION. - NO ADENOMATOUS CHANGE OR CARCINOMA. - SEE MICROSCOPIC DESCRIPTION. Microscopic Comment There are fragments of hyperplastic inflamed gastric mucosa and granulation tissue consistent with ulcer. There is a Mullins amount of attached spindle cell proliferation, which could be fibroplasia related to ulceration, or the surface of a spindle cell lesion in the submucosa. Immunohistochemistry will be performed and reported as an addendum. The case was discussed with Dr. Silverio Decamp on 10/30/15. (JDP:ds 10/30/15) ADDENDUM: Immunohistochemistry is performed and the spindle cell proliferation is negative for CD117 (Ckit) and CD34. There is focal positivity with smooth muscle myosin and Vimentin is positive. Cytokeratin AE1/AE3 highlights the epithelial glandular cells and S100 is essentially negative. The immunohistochemical findings do not support the presence of a gastrointestinal stromal tumor in these biopsies. Case discussed with James Mullins and Dr.  Loletha Mullins on 11/03/15. (JDP:gt, 11/03/15)   11/05/15:  Diagnosis FINE NEEDLE ASPIRATION, ENDOSCOPIC, GASTRIC (SPECIMEN 1 OF 1 COLLECTED 11/05/15): NO MALIGNANT CELLS IDENTIFIED.   Discharge Exam:  Complaints: Left ankle pain much improved. Able to weight bear without much pain or difficulty. No BM since yesterday. No dizziness, lightheadedness, chest pain or dyspnea.  Filed Vitals:   11/05/15 2120 11/06/15 0440 11/06/15 0849 11/06/15 1450  BP:  113/69 119/54 123/59  Pulse:  48 72 58  Temp:  97 F (36.1 C) 97.6 F (36.4 C) 98.7 F (37.1 C)  TempSrc:  Oral Oral Oral  Resp:  _0 Height:      Weight:  120.294 kg (265 lb 3.2 oz)    SpO2: 92% 97% 97% 95%    General exam: Pleasant middle-aged male sitting comfortably in bed. Spouse at bedside. Respiratory system: Clear. No increased work of breathing. Cardiovascular system: S1 & S2 heard, RRR. No JVD, murmurs, gallops, clicks or pedal edema.Telemetry: SB in the 50s-SR.  Gastrointestinal system: Abdomen is nondistended, soft and nontender. Normal bowel sounds heard. Central nervous system: Alert and oriented. No focal neurological deficits. Extremities: Symmetric 5 x 5 power. Left ankle with minimal swelling, warmth and mild painful range of movements-all significantly better than the day prior.  Discharge Instructions      Discharge Instructions    Call MD for:  difficulty breathing, headache or visual disturbances    Complete by:  As directed      Call MD for:  extreme fatigue    Complete by:  As directed      Call MD for:  persistant dizziness or light-headedness    Complete by:  As directed      Call MD for:  persistant nausea and vomiting    Complete by:  As directed      Call MD for:  severe uncontrolled pain    Complete by:  As directed      Call MD for:  temperature >100.4    Complete by:  As directed      Call MD for:    Complete by:  As directed   Recurrent black tarry stools, blood in stools, vomiting coffee-ground  or blood.     Diet - low sodium heart healthy    Complete by:  As directed      Increase activity slowly    Complete by:  As directed             Medication List    STOP taking these medications        aspirin 81 MG chewable tablet     BRILINTA 90 MG Tabs tablet  Generic drug:  ticagrelor     lisinopril 5 MG tablet  Commonly known as:  PRINIVIL,ZESTRIL      TAKE these medications        acetaminophen 325 MG tablet  Commonly known as:  TYLENOL  Take 2 tablets (650 mg total) by mouth every 6 (six) hours as needed for mild pain, moderate pain, fever or headache (or Fever >/= 101).     atorvastatin 80 MG tablet  Commonly known as:  LIPITOR  Take 1 tablet (80 mg total) by mouth daily at 6 PM.     carvedilol 6.25 MG tablet  Commonly known as:  COREG  Take 0.5 tablets (3.125 mg total) by mouth 2 (two) times daily with a meal.     clopidogrel 75 MG tablet  Commonly known as:  PLAVIX  Take 1 tablet (75 mg total) by mouth daily.  Start taking on:  11/07/2015     ferrous sulfate 325 (65 FE) MG tablet  Take 1 tablet (325 mg total) by mouth 3 (three) times daily with meals.     losartan 25 MG tablet  Commonly known as:  COZAAR  Take 1 tablet (25 mg total) by mouth daily.     multivitamin with minerals tablet  Take 1 tablet by mouth daily.     nitroGLYCERIN 0.4 MG SL tablet  Commonly known as:  NITROSTAT  Place 1 tablet (0.4 mg total) under the tongue every 5 (five) minutes as needed for chest pain (CP or SOB).     pantoprazole 40 MG tablet  Commonly known as:  PROTONIX  Take 1 tablet (40 mg total) by mouth 2 (two) times daily before a meal.     predniSONE 10 MG tablet  Commonly known as:  DELTASONE  Take 3 tabs daily 2 days, then 2 tabs daily 2 days, then 1 tab daily 2 days, then stop.  Start taking on:  11/07/2015     TYLENOL COLD PO  Take 2 tablets by mouth 3 (three) times daily as needed (cold symptoms).       Follow-up Information    Follow up with James Bellows, MD On 11/10/2015.   Specialty:  Family Medicine   Why:  appointment at 2:00 PM. To be seen with repeat labs (CBC & BMP). Will need twice a week CBC to ensure stability of hemoglobin/anemia.   Contact information:   [place] [address] [phone]       Follow up with James Martinique, MD On 11/20/2015.   Specialty:  Cardiology   Why:  Keep prior appointment at 8:15 AM.   Contact information:   [place] [address] [phone]       Follow up with James Bowie, MD.   Specialty:  Gastroenterology   Why:  MDs office will arrange follow-up appointment. Please call if you don't hear from them in 2-3 days.   Contact information:   [place] [address] [phone]       Follow up with James Klein, MD.   Specialty:  General Surgery   Why:  MDs office will arrange follow-up appointment. Please call if you don't hear back from them in 2-3 days.   Contact information:   [address] [place] [address] [phone]       Get Medicines reviewed and adjusted: Please take all your medications with you for your next visit with your Primary MD  Please request your Primary MD to go over all hospital tests and procedure/radiological results at the follow up. Please ask your Primary MD to get all Hospital records sent to his/her office.  If you experience worsening of your admission symptoms, develop shortness of breath, life threatening emergency, suicidal or homicidal thoughts you must seek medical attention immediately by calling 911 or calling your MD immediately if symptoms less severe.  You must read complete instructions/literature along with all the possible adverse reactions/side effects for all the Medicines you take and that have been prescribed to you. Take any new Medicines after you have completely understood and accept all the possible adverse reactions/side effects.    Do not drive when taking pain medications.   Do not take more than prescribed Pain, Sleep and Anxiety Medications  Special Instructions: If you have smoked or chewed Tobacco in the last 2 yrs  please stop smoking, stop any regular Alcohol and or any Recreational drug use.  Wear Seat belts while driving.  Please note  You were cared for by a hospitalist during your hospital stay. Once you are discharged, your primary care physician will handle any further medical issues. Please note that NO REFILLS for any discharge medications will be authorized once you are discharged, as it is imperative that you return to your primary care physician (or establish a relationship with a primary care physician if you do not have one) for your aftercare needs so that they can reassess your need for medications and monitor your lab values.    The results of significant diagnostics from this hospitalization (including imaging, microbiology, ancillary and laboratory) are listed below for reference.    Significant Diagnostic Studies: Dg Chest 2 View  10/29/2015  CLINICAL DATA:  Chest pain, shortness of breath. EXAM: CHEST  2 VIEW COMPARISON:  September 12, 2015. FINDINGS: The heart size and mediastinal contours are within normal limits. Both lungs are clear. No pneumothorax or pleural effusion is noted. The visualized skeletal structures are unremarkable. IMPRESSION: No active cardiopulmonary disease. Electronically Signed   By: Marijo Conception, M.D.   On: 10/29/2015 10:07   Ct Chest W Contrast  10/29/2015  CLINICAL DATA:  55 y.o. male with Gastric mass. 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 Patient reports that he started having nausea, vomiting, diarrhea at about 7 PM. He has mild abdominal discomfort. He vomited once  with bright red blood and some blood clots, described as "good amount of blood". EXAM: CT CHEST, ABDOMEN, AND PELVIS WITH CONTRAST TECHNIQUE: Multidetector CT imaging of the chest, abdomen and pelvis was performed following the standard protocol during bolus administration of intravenous contrast. CONTRAST:  172m ISOVUE-300 IOPAMIDOL (ISOVUE-300) INJECTION 61%, 569mOMNIPAQUE IOHEXOL 300 MG/ML SOLN COMPARISON:  None. FINDINGS: CT CHEST Neck base and axilla: No mass or adenopathy. Visualized thyroid is unremarkable. Mediastinum and hila: Heart normal in size and configuration. Great vessels normal in caliber. No atherosclerotic plaque. No mediastinal or hilar masses or pathologically enlarged lymph nodes. Lungs and pleura: 3.8 mm right lower lobe nodule with an adjacent 2 mm nodule. Otherwise clear. No pleural effusion. No pneumothorax. CT ABDOMEN AND PELVIS Liver, spleen, gallbladder, pancreas, adrenal glands:  Normal. Kidneys, ureters, bladder:  Normal. Lymph nodes:  No pathologically enlarged lymph nodes. Ascites:  None. Gastrointestinal: 3.7 cm round smooth mass projects along the posterior wall of the mid stomach. No other stomach mass. Normal Mullins bowel. Normal colon. Normal appendix. MUSCULOSKELETAL Mild degenerative changes along the thoracic spine. No osteoblastic or osteolytic lesions. IMPRESSION: 1. 3.7 cm gastric mass. This has smooth margins. 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 2. No evidence of locally invasive gastric malignancy or of metastatic disease. 3. No acute findings within the chest, abdomen or pelvis. Electronically Signed   By: DaLajean Manes.D.   On: 10/29/2015 20:49   Ct Abdomen Pelvis W Contrast  10/29/2015  CLINICAL DATA:  [age].o. male with Gastric mass. 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  Patient reports that he started having nausea, vomiting, diarrhea at about 7 PM. He has mild abdominal  discomfort. He vomited once with bright red blood and some blood clots, described as "good amount of blood". EXAM: CT CHEST, ABDOMEN, AND PELVIS WITH CONTRAST TECHNIQUE: Multidetector CT imaging of the chest, abdomen and pelvis was performed following the standard protocol during bolus administration of intravenous contrast. CONTRAST:  165m ISOVUE-300 IOPAMIDOL (ISOVUE-300) INJECTION 61%, 580mOMNIPAQUE IOHEXOL 300 MG/ML SOLN COMPARISON:  None. FINDINGS: CT CHEST Neck base and axilla: No mass or adenopathy. Visualized thyroid is unremarkable. Mediastinum and hila: Heart normal in size and configuration. Great vessels normal in caliber. No atherosclerotic plaque. No mediastinal or hilar masses or pathologically enlarged lymph nodes. Lungs and pleura: 3.8 mm right lower lobe nodule with an adjacent 2 mm nodule. Otherwise clear. No pleural effusion. No pneumothorax. CT ABDOMEN AND PELVIS Liver, spleen, gallbladder, pancreas, adrenal glands:  Normal. Kidneys, ureters, bladder:  Normal. Lymph nodes:  No pathologically enlarged lymph nodes. Ascites:  None. Gastrointestinal: 3.7 cm round smooth mass projects along the posterior wall of the mid stomach. No other stomach mass. Normal Mullins bowel. Normal colon. Normal appendix. MUSCULOSKELETAL Mild degenerative changes along the thoracic spine. No osteoblastic or osteolytic lesions. IMPRESSION: 1. 3.7 cm gastric mass. This has smooth margins. 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 2. No evidence of locally invasive gastric malignancy or of metastatic disease. 3. No acute findings within the chest, abdomen or pelvis. Electronically Signed   By: DaLajean Manes.D.   On: 10/29/2015 20:49    Microbiology: No results found for this or any previous visit (from the past 240 hour(s)).   Labs: Basic Metabolic Panel:  Recent Labs Lab 10/31/15 0512 11/03/15 0453 11/05/15 0439  NA  139 142 141  K 3.9 4.1 4.0  CL 110 111 107  CO2 _0 GLUCOSE 90 110* 97  BUN _1 CREATININE 0.73 0.88 0.92  CALCIUM 7.6* 7.9* 8.4*  MG  --  1.9  --    Liver Function Tests: No results for input(s): AST, ALT, ALKPHOS, BILITOT, PROT, ALBUMIN in the last 168 hours. No results for input(s): LIPASE, AMYLASE in the last 168 hours. No results for input(s): AMMONIA in the last 168 hours. CBC:  Recent Labs Lab 11/02/15 0441 11/03/15 0453 11/04/15 0442 11/05/15 0049 11/05/15 0439 11/06/15 0438  WBC 3.9* 4.6 3.7*  --  6.7 5.9  HGB 7.9* 8.4* 7.8* 9.5* 9.6* 10.1*  HCT 23.4* 26.0* 23.6* 28.7* 29.1* 31.4*  MCV 83.3 85.5 83.7  --  82.2 85.3  PLT 180 220 218  --  272 290   Cardiac Enzymes: No results for input(s): CKTOTAL, CKMB, CKMBINDEX, TROPONINI in the last 168 hours. BNP: BNP (last 3 results)  Recent Labs  10/29/15 1350  BNP 28.9    ProBNP (last 3 results) No results for input(s): PROBNP in the last 8760 hours.  CBG:  Recent Labs Lab 11/02/15 0813 11/03/15 0733 11/04/15 0733 11/05/15 0805 11/06/15 0749  GLUCAP 85 93 89 82 103*       Signed:  HOVernell LeepMD, FACP, FHM. [facility] Pager [phone] 7PM-7AM, please contact night-coverage [email or web address] Password [email or web address] 11/06/2015, 4:02 PM

## 2015-11-06 NOTE — Progress Notes (Signed)
1 Day Post-Op  Subjective: No complaints.  Wife in room.  Objective: Vital signs in last 24 hours: Temp:  [97 F (36.1 C)-99.8 F (37.7 C)] 97.6 F (36.4 C) (04/07 0849) Pulse Rate:  [48-82] 72 (04/07 0849) Resp:  [16-24] 16 (04/07 0849) BP: (93-145)/(51-80) 119/54 mmHg (04/07 0849) SpO2:  [90 %-100 %] 97 % (04/07 0849) Weight:  [120.294 kg (265 lb 3.2 oz)] 120.294 kg (265 lb 3.2 oz) (04/07 0440) Last BM Date: 11/04/15  Intake/Output from previous day: 04/06 0701 - 04/07 0700 In: 700 [I.V.:700] Out: -  Intake/Output this shift:    PE: General- In NAD Abdomen-soft not tender  Lab Results:   Recent Labs  11/05/15 0439 11/06/15 0438  WBC 6.7 5.9  HGB 9.6* 10.1*  HCT 29.1* 31.4*  PLT 272 290   BMET  Recent Labs  11/05/15 0439  NA 141  K 4.0  CL 107  CO2 27  GLUCOSE 97  BUN 11  CREATININE 0.92  CALCIUM 8.4*   PT/INR No results for input(s): LABPROT, INR in the last 72 hours. Comprehensive Metabolic Panel:    Component Value Date/Time   NA 141 11/05/2015 0439   NA 142 11/03/2015 0453   K 4.0 11/05/2015 0439   K 4.1 11/03/2015 0453   CL 107 11/05/2015 0439   CL 111 11/03/2015 0453   CO2 27 11/05/2015 0439   CO2 25 11/03/2015 0453   BUN 11 11/05/2015 0439   BUN 8 11/03/2015 0453   CREATININE 0.92 11/05/2015 0439   CREATININE 0.88 11/03/2015 0453   GLUCOSE 97 11/05/2015 0439   GLUCOSE 110* 11/03/2015 0453   CALCIUM 8.4* 11/05/2015 0439   CALCIUM 7.9* 11/03/2015 0453   AST 19 10/29/2015 1010   AST 49* 09/12/2015 1458   ALT 17 10/29/2015 1010   ALT 23 09/12/2015 1458   ALKPHOS 45 10/29/2015 1010   ALKPHOS 55 09/12/2015 1458   BILITOT 0.2* 10/29/2015 1010   BILITOT 0.4 09/12/2015 1458   PROT 5.0* 10/29/2015 1010   PROT 5.9* 09/12/2015 1458   ALBUMIN 2.8* 10/29/2015 1010   ALBUMIN 3.3* 09/12/2015 1458     Studies/Results: No results found.  Anti-infectives: Anti-infectives    None      Assessment Principal Problem:   GI bleed from  gastric polyposis involving at least 80%-90% of the stomach-hemoglobin stable; pathology from biopsy yesterday is pending. Active Problems:   Obesity (BMI 30.0-34.9)   ST elevation (STEMI) myocardial infarction involving right coronary artery (HCC) 8 weeks ago with DES in   OSA (obstructive sleep apnea)   Anemia   Chronic combined systolic and diastolic CHF (congestive heart failure) ([place])      LOS: 8 days   Plan: I had a long discussion with him and his wife. And have discussed his situation with Dr. Barry Dienes and Dr. Cathie Olden (Surgical oncologist at [facility]).  We recommend nursing him along on single agent anti-platelet therapy for his DES (with increased risk of stent occlusion), BID PPI, soft diet, Iron supplement TID with meals and hemoglobin checks twice a week for as long as possible ( and ideally 6 months after stent placement) before doing major surgery.  If he has continued significant bleeding requiring blood transfusions, he major require surgery earlier with it's significantly increased risk.  He made need a colonoscopy in the interim if the gastric polyps are adenomatous.  Will get him an outpatient appointment with Dr. Barry Dienes.   Prashant Glosser J 11/06/2015

## 2015-11-06 NOTE — Progress Notes (Signed)
Patient ID: James Mullins, male   DOB: Apr 25, 1961, 55 y.o.   MRN: [id number]   Patient Name: James Mullins Date of Encounter: 11/06/2015     Principal Problem:   GI bleed Active Problems:   Obesity (BMI 30.0-34.9)   ST elevation (STEMI) myocardial infarction involving right coronary artery (HCC)   OSA (obstructive sleep apnea)   Anemia   Chronic combined systolic and diastolic CHF (congestive heart failure) (HCC)   Cough   Acute blood loss anemia   Upper GI bleed   Gastric mass   Bleeding gastrointestinal   Melena   Acute upper GI bleed    SUBJECTIVE  Awaiting EGD, EUS and biopsy today. Only complaint is ankle pain that he thinks is a gout flare.   CURRENT MEDS . aspirin  81 mg Oral Daily  . atorvastatin  80 mg Oral q1800  . carvedilol  3.125 mg Oral BID WC  . dextromethorphan-guaiFENesin  1 tablet Oral BID  . losartan  25 mg Oral Daily  . multivitamin with minerals  1 tablet Oral Daily  . pantoprazole  40 mg Oral BID AC  . predniSONE  40 mg Oral Q breakfast  . sodium chloride flush  3 mL Intravenous Q12H    OBJECTIVE  Filed Vitals:   11/05/15 1733 11/05/15 2102 11/05/15 2120 11/06/15 0440  BP: 118/72 107/59  113/69  Pulse: 65 70  48  Temp: 98.8 F (37.1 C) 99 F (37.2 C)  97 F (36.1 C)  TempSrc: Oral Oral  Oral  Resp: 24 20  18   Height:      Weight:    120.294 kg (265 lb 3.2 oz)  SpO2:  90% 92% 97%    Intake/Output Summary (Last 24 hours) at 11/06/15 0800 Last data filed at 11/05/15 1504  Gross per 24 hour  Intake    700 ml  Output      0 ml  Net    700 ml   Filed Weights   11/04/15 0500 11/05/15 0556 11/06/15 0440  Weight: 122.653 kg (270 lb 6.4 oz) 122.3 kg (269 lb 10 oz) 120.294 kg (265 lb 3.2 oz)    PHYSICAL EXAM  General: Pleasant, NAD. obese Neuro: Alert and oriented X 3. Moves all extremities spontaneously. Psych: Normal affect. HEENT:  Normal  Neck: Supple without bruits or JVD. Lungs:  Resp regular and unlabored,  CTA. Heart: RRR no s3, s4, or murmurs. Abdomen: Soft, non-tender, non-distended, BS + x 4.  Extremities: No clubbing, cyanosis or edema. DP/PT/Radials 2+ and equal bilaterally. Left ankle slighlty swollen  Accessory Clinical Findings  CBC  Recent Labs  11/05/15 0439 11/06/15 0438  WBC 6.7 5.9  HGB 9.6* 10.1*  HCT 29.1* 31.4*  MCV 82.2 85.3  PLT 272 [id number]   Basic Metabolic Panel  Recent Labs  11/05/15 0439  NA 141  K 4.0  CL 107  CO2 27  GLUCOSE 97  BUN 11  CREATININE 0.92  CALCIUM 8.4*    TELE  NSR with some PACs and PVCs. Some sinus brady overnight.   Radiology/Studies  Dg Chest 2 View  10/29/2015  CLINICAL DATA:  Chest pain, shortness of breath. EXAM: CHEST  2 VIEW COMPARISON:  September 12, 2015. FINDINGS: The heart size and mediastinal contours are within normal limits. Both lungs are clear. No pneumothorax or pleural effusion is noted. The visualized skeletal structures are unremarkable. IMPRESSION: No active cardiopulmonary disease. Electronically Signed   By: Marijo Conception, M.D.   On:  10/29/2015 10:07   Ct Chest W Contrast  10/29/2015  CLINICAL DATA:  55 y.o. male with Gastric mass. 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 Patient reports that he started having nausea, vomiting, diarrhea at about 7 PM. He has mild abdominal discomfort. He vomited once with bright red blood and some blood clots, described as "good amount of blood". EXAM: CT CHEST, ABDOMEN, AND PELVIS WITH CONTRAST TECHNIQUE: Multidetector CT imaging of the chest, abdomen and pelvis was performed following the standard protocol during bolus administration of intravenous contrast. CONTRAST:  150mL ISOVUE-300 IOPAMIDOL (ISOVUE-300) INJECTION 61%, 60mL OMNIPAQUE IOHEXOL 300 MG/ML SOLN COMPARISON:  None. FINDINGS:  CT CHEST Neck base and axilla: No mass or adenopathy. Visualized thyroid is unremarkable. Mediastinum and hila: Heart normal in size and configuration. Great vessels normal in caliber. No atherosclerotic plaque. No mediastinal or hilar masses or pathologically enlarged lymph nodes. Lungs and pleura: 3.8 mm right lower lobe nodule with an adjacent 2 mm nodule. Otherwise clear. No pleural effusion. No pneumothorax. CT ABDOMEN AND PELVIS Liver, spleen, gallbladder, pancreas, adrenal glands:  Normal. Kidneys, ureters, bladder:  Normal. Lymph nodes:  No pathologically enlarged lymph nodes. Ascites:  None. Gastrointestinal: 3.7 cm round smooth mass projects along the posterior wall of the mid stomach. No other stomach mass. Normal small bowel. Normal colon. Normal appendix. MUSCULOSKELETAL Mild degenerative changes along the thoracic spine. No osteoblastic or osteolytic lesions. IMPRESSION: 1. 3.7 cm gastric mass. This has smooth margins. 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 2. No evidence of locally invasive gastric malignancy or of metastatic disease. 3. No acute findings within the chest, abdomen or pelvis. Electronically Signed   By: Lajean Manes M.D.   On: 10/29/2015 20:49   Ct Abdomen Pelvis W Contrast  10/29/2015  CLINICAL DATA:  55 y.o. male with Gastric mass. 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 Patient reports that he started having nausea, vomiting, diarrhea at about 7 PM. He has mild abdominal discomfort. He vomited once with bright red blood and some blood clots, described as "good amount of blood". EXAM: CT CHEST, ABDOMEN, AND PELVIS WITH CONTRAST TECHNIQUE: Multidetector CT imaging of the chest, abdomen and pelvis was performed following the standard protocol during bolus administration of intravenous contrast.  CONTRAST:  161mL ISOVUE-300 IOPAMIDOL (ISOVUE-300) INJECTION 61%, 75mL OMNIPAQUE IOHEXOL 300 MG/ML SOLN COMPARISON:  None. FINDINGS: CT CHEST Neck base and axilla: No mass or adenopathy. Visualized thyroid is unremarkable. Mediastinum and hila: Heart normal in size and configuration. Great vessels normal in caliber. No atherosclerotic plaque. No mediastinal or hilar masses or pathologically enlarged lymph nodes. Lungs and pleura: 3.8 mm right lower lobe nodule with an adjacent 2 mm nodule. Otherwise clear. No pleural effusion. No pneumothorax. CT ABDOMEN AND PELVIS Liver, spleen, gallbladder, pancreas, adrenal glands:  Normal. Kidneys, ureters, bladder:  Normal. Lymph nodes:  No pathologically enlarged lymph nodes. Ascites:  None. Gastrointestinal: 3.7 cm round smooth mass projects along the posterior wall of the mid stomach. No other stomach mass. Normal small bowel. Normal colon. Normal appendix. MUSCULOSKELETAL Mild degenerative changes along the thoracic spine. No osteoblastic or osteolytic lesions. IMPRESSION: 1. 3.7 cm gastric mass. This has smooth margins. It may  reflect a leiomyoma. 2. No evidence of locally invasive gastric malignancy or of metastatic disease. 3. No acute findings within the chest, abdomen or pelvis. Electronically Signed   By: Lajean Manes M.D.   On: 10/29/2015 20:49    ASSESSMENT AND PLAN  James Mullins is a 55 y.o. year old male with a history of obesity, OSA on CPAP, chronic ETOH use, chronic systolic CHF and CAD with recent STEMI s/p DES to RCA (09/14/15) who presented to [facility] on [facility]15 with N&V, diarrhea, hematemesis and cough. H&H 6.5/20.2 on admission. EGD showed multiple large gastric nodules with multiple ulcers, differential includes GIST vs gastric varices specimens collected. Cardiology asked to weigh in on DAPT and preop risk as pt may need surgery.  CAD recent stent to RCA: 2/17. Fairly large 3.5 mm DES to RCA. On 81 mg ASA. Given endo/US images of "polyposis  and bleeding with just touching mucosa And need for 6 units of PRBC;s  Will not be able to restart Brillinta for now   Chronic systolic CHF: EF 123456. +1.7L. Given lasix 20mg  IV x1. Weight appears stable. No s/s CHF. Continue Coreg 3.125mg  BID and Losartan 25mg  daily.  GI: Path report with ulcer ? Spindle proliferation possible stromal cancer.[place] and repeat biopsy done yesterday Dr Zella Richer to ask Dr Barry Dienes if she will operate Or transfer to tertiary center  Acute blood loss anemia: transfused 6 units PRBC total, transfused 2 units 4/5/  with good response   Possible gout flare: in left ankle. Has a history of gout. Per IM  James Mullins

## 2015-11-06 NOTE — Progress Notes (Signed)
Patient given discharge, follow up, and medication instructions, verbalized understanding, IV's and telemetry removed, prescriptions given, family to transport home

## 2015-11-06 NOTE — Discharge Instructions (Signed)

## 2015-11-06 NOTE — Progress Notes (Signed)
   11/06/15 1000  Clinical Encounter Type  Visited With Patient;Patient and family together;Family  Visit Type Follow-up  Spiritual Encounters  Spiritual Needs Emotional;Prayer  Ch visited as a follow-up with pt and wife at bedside; Ch present for MD brief about pt status; pt in good overall spirits in spite of diagnosis; MD trying to get pt to specialists at [facility] and pt optimistic; [place] offered spiritual, emotional and social support.  Prayer also offered. Gwynn Burly 10:19 AM

## 2015-11-06 NOTE — Progress Notes (Signed)
Patient ID: James Mullins, male   DOB: March 07, 1961, 55 y.o.   MRN: [id number]    Progress Note   Subjective    No c/o., no bleeding - feels fine    Objective   Vital signs in last 24 hours: Temp:  [97 F (36.1 C)-99.8 F (37.7 C)] 97.6 F (36.4 C) (04/07 0849) Pulse Rate:  [48-82] 72 (04/07 0849) Resp:  [16-24] 16 (04/07 0849) BP: (93-145)/(51-80) 119/54 mmHg (04/07 0849) SpO2:  [90 %-100 %] 97 % (04/07 0849) Weight:  [265 lb 3.2 oz (120.294 kg)] 265 lb 3.2 oz (120.294 kg) (04/07 0440) Last BM Date: 11/04/15 General:    White male NAD Heart:  Regular rate and rhythm; no murmurs Lungs: Respirations even and unlabored, lungs CTA bilaterally Abdomen:  Soft, nontender and nondistended. Normal bowel sounds. Extremities:  Without edema. Neurologic:  Alert and oriented,  grossly normal neurologically. Psych:  Cooperative. Normal mood and affect.  Intake/Output from previous day: 04/06 0701 - 04/07 0700 In: 700 [I.V.:700] Out: -  Intake/Output this shift:    Lab Results:  Recent Labs  11/04/15 0442 11/05/15 0049 11/05/15 0439 11/06/15 0438  WBC 3.7*  --  6.7 5.9  HGB 7.8* 9.5* 9.6* 10.1*  HCT 23.6* 28.7* 29.1* 31.4*  PLT 218  --  272 290   BMET  Recent Labs  11/05/15 0439  NA 141  K 4.0  CL 107  CO2 27  GLUCOSE 97  BUN 11  CREATININE 0.92  CALCIUM 8.4*   LFT No results for input(s): PROT, ALBUMIN, AST, ALT, ALKPHOS, BILITOT, BILIDIR, IBILI in the last 72 hours. PT/INR No results for input(s): LABPROT, INR in the last 72 hours.       Assessment / Plan:    #1 55 yo WM s/p STEMI  6 weeks ago - s/p DES RCA who presented with bleed while on ASA and Brilinta EUS yesterday with finding of multiple  Gastric nodules/largest 4cm  extending from GE junction to distal stomach , several frankly ulcerated, and bled to touch- none communicated with muscularis and therefore not c/W GIST Bx's pending  No bleeding -hgb stable at 10 .1  Today  Cardiology plans  to leave just on ASA   Surgery  Is consulting with surgical oncology at [facility]- transfer to [facility] may be best option in this very difficult situation   Principal Problem:   GI bleed Active Problems:   Obesity (BMI 30.0-34.9)   ST elevation (STEMI) myocardial infarction involving right coronary artery (HCC)   OSA (obstructive sleep apnea)   Anemia   Chronic combined systolic and diastolic CHF (congestive heart failure) (HCC)   Cough   Acute blood loss anemia   Upper GI bleed   Gastric mass   Bleeding gastrointestinal   Melena   Acute upper GI bleed     LOS: 8 days   James Mullins  11/06/2015, 9:03 AM  I have reviewed the entire case in detail with the above APP and discussed the plan in detail.  Therefore, I agree with the diagnoses recorded above. In addition,  I have personally interviewed and examined the patient and have personally reviewed any abdominal/pelvic CT scan images.  My additional thoughts are as follows:  CC: melena and anemia, gastric polyps  I have interviewed and examined the patient. Surgery, medicine and I have had a lengthy discussion with the patient and his wife.  Surgical attending conferred with a surgical oncologist at [facility].  No surgery now b/c cardiac risk.  They have decided to send the patient home today on just aspirin.  When pathology returns, if polyps are adenomatous, he will need a colonoscopy soon to make sure he does not have a polyposis syndrome, especially since surgery will be eventually necessary.    James Mullins Pager [phone]  Mon-Fri 8a-5p [phone] after 5p, weekends, holidays

## 2015-11-08 DIAGNOSIS — M109 Gout, unspecified: Secondary | ICD-10-CM | POA: Insufficient documentation

## 2015-11-09 NOTE — Anesthesia Postprocedure Evaluation (Signed)
Anesthesia Post Note  Patient: James Mullins  Procedure(s) Performed: Procedure(s) (LRB): FULL UPPER ENDOSCOPIC ULTRASOUND (EUS) RADIAL (N/A)  Patient location during evaluation: PACU Anesthesia Type: MAC Level of consciousness: awake and alert Pain management: pain level controlled Vital Signs Assessment: post-procedure vital signs reviewed and stable Respiratory status: spontaneous breathing, nonlabored ventilation, respiratory function stable and patient connected to nasal cannula oxygen Cardiovascular status: stable and blood pressure returned to baseline Anesthetic complications: no    Last Vitals:  Filed Vitals:   11/06/15 0849 11/06/15 1450  BP: 119/54 123/59  Pulse: 72 58  Temp: 36.4 C 37.1 C  Resp: 16 20    Last Pain:  Filed Vitals:   11/06/15 1450  PainSc: [place] S

## 2015-11-10 ENCOUNTER — Telehealth: Payer: Self-pay | Admitting: Gastroenterology

## 2015-11-10 ENCOUNTER — Ambulatory Visit: Payer: Managed Care, Other (non HMO) | Admitting: Cardiology

## 2015-11-10 DIAGNOSIS — K922 Gastrointestinal hemorrhage, unspecified: Secondary | ICD-10-CM

## 2015-11-10 NOTE — Telephone Encounter (Signed)
11/10/2015 02:37 PM Phone (Incoming) [facility] [place] (Other) [phone]    Laverta [place] states that Dr. Justin Mend is wanting patient to be seen within the next 2-3 weeks for hospital follow up. Appt has already been scheduled for June. She will fax over notes.

## 2015-11-10 NOTE — Telephone Encounter (Signed)
See alternate note  

## 2015-11-12 NOTE — Telephone Encounter (Signed)
Appt made for 11/17/15 at 815 am.  Labs in [facility] and pt is aware to come in on Monday for the lab.

## 2015-11-12 NOTE — Telephone Encounter (Signed)
Patty,    I have reviewed all of his pathology results.  Good news, there is no cancer seen.  Also, because we had discussed this particular issue, they are not the same kind of polyps that are seen in the colon.  So no colonoscopy right now. I would like to see him next Tues at 8:15 AM (please arrive at 8).  And I would like him to have a Hemoglobin and hematocrit drawn at our lab next Monday ( so that I will have the results by the [place] appointment).  I see that during his wife's call for path results on 4/11, she requested a referral to [facility].  We can discuss that next week.

## 2015-11-13 ENCOUNTER — Telehealth: Payer: Self-pay | Admitting: Hematology

## 2015-11-13 NOTE — Telephone Encounter (Signed)
Spoke with patient yesterday re appointment for hosp fu with [facility] 4/18 @ 3 pm to arrive 2:30 pm.

## 2015-11-16 ENCOUNTER — Other Ambulatory Visit: Payer: Self-pay | Admitting: General Surgery

## 2015-11-16 ENCOUNTER — Other Ambulatory Visit (INDEPENDENT_AMBULATORY_CARE_PROVIDER_SITE_OTHER): Payer: Managed Care, Other (non HMO)

## 2015-11-16 DIAGNOSIS — K922 Gastrointestinal hemorrhage, unspecified: Secondary | ICD-10-CM | POA: Diagnosis not present

## 2015-11-16 LAB — HEMOGLOBIN AND HEMATOCRIT, BLOOD
HCT: 35.2 % — ABNORMAL LOW (ref 39.0–52.0)
Hemoglobin: 11.4 g/dL — ABNORMAL LOW (ref 13.0–17.0)

## 2015-11-17 ENCOUNTER — Telehealth: Payer: Self-pay | Admitting: Hematology

## 2015-11-17 ENCOUNTER — Encounter: Payer: Self-pay | Admitting: Gastroenterology

## 2015-11-17 ENCOUNTER — Telehealth: Payer: Self-pay | Admitting: Cardiology

## 2015-11-17 ENCOUNTER — Ambulatory Visit (INDEPENDENT_AMBULATORY_CARE_PROVIDER_SITE_OTHER): Payer: Managed Care, Other (non HMO) | Admitting: Gastroenterology

## 2015-11-17 ENCOUNTER — Inpatient Hospital Stay: Payer: Managed Care, Other (non HMO) | Admitting: Hematology

## 2015-11-17 ENCOUNTER — Telehealth: Payer: Self-pay | Admitting: Gastroenterology

## 2015-11-17 VITALS — BP 132/70 | HR 60 | Ht 69.0 in | Wt 269.0 lb

## 2015-11-17 DIAGNOSIS — K319 Disease of stomach and duodenum, unspecified: Secondary | ICD-10-CM

## 2015-11-17 DIAGNOSIS — D62 Acute posthemorrhagic anemia: Secondary | ICD-10-CM

## 2015-11-17 DIAGNOSIS — D509 Iron deficiency anemia, unspecified: Secondary | ICD-10-CM

## 2015-11-17 DIAGNOSIS — K3189 Other diseases of stomach and duodenum: Secondary | ICD-10-CM

## 2015-11-17 DIAGNOSIS — K922 Gastrointestinal hemorrhage, unspecified: Secondary | ICD-10-CM

## 2015-11-17 DIAGNOSIS — Z800 Family history of malignant neoplasm of digestive organs: Secondary | ICD-10-CM | POA: Diagnosis not present

## 2015-11-17 NOTE — Telephone Encounter (Signed)
11/17/2015 Received faxed packet of records from [facility] for upcoming appointment with Dr. Martinique on 11/20/2015.   Records given to [facility]. cbr

## 2015-11-17 NOTE — Patient Instructions (Signed)
You have been scheduled for a colonoscopy. Please follow written instructions given to you at your visit today.  Please pick up your prep supplies at the pharmacy within the next 1-3 days. If you use inhalers (even only as needed), please bring them with you on the day of your procedure. Your physician has requested that you go to www.startemmi.com and enter the access code given to you at your visit today. This web site gives a general overview about your procedure. However, you should still follow specific instructions given to you by our office regarding your preparation for the procedure.  If you are age 29 or older, your body mass index should be between 23-30. Your Body mass index is 39.71 kg/(m^2). If this is out of the aforementioned range listed, please consider follow up with your Primary Care Provider.  If you are age 29 or younger, your body mass index should be between 19-25. Your Body mass index is 39.71 kg/(m^2). If this is out of the aformentioned range listed, please consider follow up with your Primary Care Provider.   Thank you for choosing [facility]  Dr Wilfrid Lund III

## 2015-11-17 NOTE — Progress Notes (Signed)
[facility] Progress Note  Chief Complaint: Acid polyposis.  Subjective History: PCP: Justin Mend at [facility]  This is the initial office follow-up after recent hospitalization for James Mullins.  In summary, he was admitted to [facility] 3 weeks ago with melena and anemia of acute GI blood loss. Upper endoscopy by Dr. Silverio Decamp showed scores of ulcerated gastric polyps, and one very large polyp (exact size not noted) in the gastric body. Initially she felt that largest polyp was perhaps a subepithelial or stromal tumor. It should be noted that the bleeding was really diffusely from the friable polyposis and not just from the large polyp. A mucosal biopsy was taken, no therapeutic intervention could be given. This initial biopsy was nondiagnostic, but suggested some rare spindle cells. Eventually, this was felt most likely to be reactive from the ulceration. Surgical consult was obtained with Dr. Zella Richer and the patient was observed for nearly a week. He eventually received a total of 5 units of PRBC transfusion, and his hemoglobin stabilized. This was all made more complicated by the fact that he had been admitted 1 month prior with acute ST segment elevation MI requiring placement of a drug-eluting stent. Therefore, the patient had been admitted for GI bleed while on aspirin and Plavix. He was kept an 81 mg aspirin during hospital stay, and then discharged home on just Plavix.  Prior to discharge, and EUS with an FNA of the largest polyp was performed, also one of the smaller polyps was removed in total. The smaller polyp was hyperplastic. The largest polyp FNA results just showed no malignancy. He is also on twice daily Protonix. Fortunately, his hemoglobin is slowly increasing on this regimen and also 3 time a day iron. After many discussions over several days, Dr. Zella Richer consulted over the phone with a surgical colleague at [facility] (the name of which she does not seem to be recorded in the  chart), the decision was made to hold off on any surgery until at least several months passed from the MI. Of course, this was all assuming the patient's GI bleeding was stable. James Mullins and his wife saw Dr. Barry Dienes of that same surgical group yesterday, I do not yet have access to her note. They're telling me that she is perhaps planning surgery in about a month, and it sounds like she may want to do a subtotal gastrectomy. He has no epigastric pain, and in fact had no pain throughout this episode. His stool is dark on iron but no black tarry stool. He denies chest pain or dyspnea, and he is now back to work. He was initially seen oncology consult during the hospital stay, but as of yet we have no actual cancer diagnosis. Lastly, James Mullins's mother had colon cancer and he has never had a colonoscopy. I held off doing that last month since his polyps were found to be hyperplastic, and also because I did not want him to undergo too many endoscopic procedures while he was anemic and had a relatively recent MI. ROS: Cardiovascular:  no chest pain Respiratory: no dyspnea  The patient's Past Medical, Family and Social History were reviewed and are on file in the EMR.  Objective:  Med list reviewed  Vital signs in last 24 hrs: Filed Vitals:   11/17/15 0800  BP: 132/70  Pulse: 60    Physical Exam   HEENT: sclera anicteric, oral mucosa moist without lesions  Neck: supple, no thyromegaly, JVD or lymphadenopathy  Cardiac: RRR without murmurs, S1S2 heard,  no peripheral edema  Pulm: clear to auscultation bilaterally, normal RR and effort noted  Abdomen: Obese soft, no tenderness, with active bowel sounds. No guarding or palpable hepatosplenomegaly.  Skin; warm and dry, no jaundice or rash  Recent Labs:  Lab Results  Component Value Date   WBC 5.9 11/06/2015   HGB 11.4* 11/16/2015   HCT 35.2* 11/16/2015   MCV 85.3 11/06/2015   PLT 290 11/06/2015   Hgb up from 10.1 on 11/06/15  Pathology  results as noted above.   @ASSESSMENTPLANBEGIN @ Assessment: Encounter Diagnoses  Name Primary?  . IDA (iron deficiency anemia) Yes  . Family history of colon cancer   . Gastrointestinal hemorrhage, unspecified gastritis, unspecified gastrointestinal hemorrhage type   . Acute blood loss anemia   . 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 Fortunately, he is not showing signs of overt bleeding at present. Unfortunately, he will need to be on antiplatelet therapy, ideally dual antiplatelet therapy, as soon as is feasible. Therefore, I think he will almost certainly require surgery, even if we assume that the largest polyp is truly benign. I discussed with Ton and his wife the possibility of focal dysplasia or even early cancer in a polyp of the size, despite the recent biopsies. Due to the complexity of the case, they're understandable concern over a surgery like this given the recent MI, and they're perceived differing opinions among the GI and surgical consultants have seen so far, they would also like a second opinion from an academic institution. They understand that there is not going to be a specialist in this particular unusual scenario, but I cannot object to their desire for information. I do not think there is an endoscopic solution to this, given the diffuse nature of this polyposis and its diffuse ulceration.  Plan:  Colonoscopy next month to be absolutely sure he does not have an unusual polyposis syndrome such as attenuated FAP, prior to any surgical procedure. Request referral to [facility], Drs. Okey Dupre or [facility] Weekly CBC with PCP BID Protonix Continue Plavix, even right up to the colonoscopy date.  They understand that if he shows signs of acute GI bleeding again with passage of melena and certainly if he has chest pain  or feels lightheaded or dyspnea, he should present to the ED immediately.  Over half of the 35 minute encounter was spent in counseling and coordination of care.   Topics discussed: As noted above   James Mullins

## 2015-11-17 NOTE — Telephone Encounter (Signed)
Vivien Rota,    I thought about this patient a little more since his visit this AM.   He has been getting his blood counts checked by his PCP. I would like him to have his next CBC checked by me at our lab next Thursday or Friday.  At the same time, I would like them to draw an H. Pylori IgG serum antibody. Please contact him and make the arrangements Thanks  - HD

## 2015-11-17 NOTE — Telephone Encounter (Signed)
pt called to advise that GI Dr. did not think he was cancerous and did not want to keep appts...Marland Kitchenappts cx per pt request

## 2015-11-18 ENCOUNTER — Telehealth (HOSPITAL_COMMUNITY): Payer: Self-pay | Admitting: Cardiac Rehabilitation

## 2015-11-18 NOTE — Telephone Encounter (Signed)
3 unsuccessful attempts to contact pt via telephone and letter mailed to home address to enroll in cardiac rehab. Pt has not responded.  No further attempts to contact pt will be made at this time.

## 2015-11-18 NOTE — Telephone Encounter (Signed)
Contacted patient . He can't do labs next Thursday or Friday. He's going to be out of town. He agrees to do them the following week (12-02-2015). He is however going this Friday to his primary doctors office. He wants to know if you want the order for the h.pylori sent there, or just wait to do all labs here on  12-02-2015. Please advise. His wife then called back and asked to see if you could look into sending a referral to [facility]. I informed her we sent records to [facility] already. She will do some research into [facility] as they are in network and [facility] is not. She will call back if she decides to have a referral sent to [facility].

## 2015-11-18 NOTE — Telephone Encounter (Signed)
Left message to return call 

## 2015-11-19 ENCOUNTER — Other Ambulatory Visit: Payer: Self-pay

## 2015-11-19 DIAGNOSIS — D509 Iron deficiency anemia, unspecified: Secondary | ICD-10-CM

## 2015-11-19 NOTE — Telephone Encounter (Signed)
H.Pylori lab has been faxed to Dr Arbie Cookey [facility] office. Pt would like to pass on having the CBC done here. He will make sure his PCP will fax [place] results when they have them at his primary care office. He doesn't see why he has to have them here too. Its too expensive to keep having the same labs done over and over. They have also scheduled an appointment with Dr Selinda Eon of [place]. Appointment booked for 12-02-2015. They are requesting the records from our office to faxed over. [phone] Bonnee Quin ARNP

## 2015-11-19 NOTE — Telephone Encounter (Signed)
Yes, it would be fine to have the serum H. Pylori IgG antibody drawn with his labs at PCP office this week.

## 2015-11-20 ENCOUNTER — Other Ambulatory Visit: Payer: Managed Care, Other (non HMO)

## 2015-11-20 ENCOUNTER — Ambulatory Visit (INDEPENDENT_AMBULATORY_CARE_PROVIDER_SITE_OTHER): Payer: Managed Care, Other (non HMO) | Admitting: Cardiology

## 2015-11-20 ENCOUNTER — Encounter: Payer: Self-pay | Admitting: Cardiology

## 2015-11-20 VITALS — BP 128/84 | HR 71 | Ht 70.0 in | Wt 267.2 lb

## 2015-11-20 DIAGNOSIS — I2510 Atherosclerotic heart disease of native coronary artery without angina pectoris: Secondary | ICD-10-CM

## 2015-11-20 DIAGNOSIS — I5042 Chronic combined systolic (congestive) and diastolic (congestive) heart failure: Secondary | ICD-10-CM | POA: Diagnosis not present

## 2015-11-20 DIAGNOSIS — E669 Obesity, unspecified: Secondary | ICD-10-CM

## 2015-11-20 DIAGNOSIS — D509 Iron deficiency anemia, unspecified: Secondary | ICD-10-CM

## 2015-11-20 LAB — LIPID PANEL
Cholesterol: 178 mg/dL (ref 125–200)
HDL: 47 mg/dL (ref >=40)
LDL CALC: 89 mg/dL (ref <130)
Total CHOL/HDL Ratio: 3.8 Ratio (ref <=5.0)
Triglycerides: 211 mg/dL — ABNORMAL HIGH (ref <150)
VLDL: 42 mg/dL — AB (ref <30)

## 2015-11-20 NOTE — Patient Instructions (Signed)
Continue your current therapy  We will check your lipids with your lab work  I will see you in 2 months.

## 2015-11-20 NOTE — Progress Notes (Signed)
11/20/2015 James Mullins   [birth year]  [id number]  Primary Physician Jonathon Bellows, MD Primary Cardiologist: Dr. Martinique  HPI:   Mr. Malbrough is seen for follow up CAD. His past medical history is significant for obesity and obstructive sleep apnea on CPAP therapy. He presented to [facility] on 09/12/2015 with  an inferior STEMI. Emergent left heart catheterization by Dr. Martinique revealed an occluded RCA which was successfully treated with PCI utilizing a drug-eluting stent. There was no other residual disease. Left ventricular systolic function was moderately reduced at 40-45%. He was treated with dual antiplatelet therapy with aspirin plus Brilinta, Lipitor, Coreg and lisinopril. During his hospitalization he was also noted to have a microcytic anemia with a hemoglobin of 7.8 and mean corpuscle volume of 67.6.   On March 31 he was admitted with acute GI bleed. He required 6 units transfusion. Brilinta was held during his hospitalization. He was continued on ASA. EGD demonstrated diffuse gastric polyps and nodules with increased friability and easy bleeding. He is followed by GI and general surgery. He is being considered for gastrectomy. At hospital discharge he was placed on Plavix and ASA was held. On follow up today he is doing very well from a cardiac standpoint. No chest pain or SOB. No palpitations. No edema. Denies recurrent bleeding and last Hgb up to 11.4. Initial gastric biopsies negative for malignancy.     Current Outpatient Prescriptions  Medication Sig Dispense Refill  . acetaminophen (TYLENOL) 325 MG tablet Take 2 tablets (650 mg total) by mouth every 6 (six) hours as needed for mild pain, moderate pain, fever or headache (or Fever >/= 101).    Marland Kitchen atorvastatin (LIPITOR) 80 MG tablet Take 1 tablet (80 mg total) by mouth daily at 6 PM. 30 tablet 5  . carvedilol (COREG) 6.25 MG tablet Take 0.5 tablets (3.125 mg total) by mouth 2 (two) times daily with a meal. 60 tablet 5    . clopidogrel (PLAVIX) 75 MG tablet Take 1 tablet (75 mg total) by mouth daily. 30 tablet 0  . ferrous sulfate 325 (65 FE) MG tablet Take 1 tablet (325 mg total) by mouth 3 (three) times daily with meals. 90 tablet 0  . losartan (COZAAR) 25 MG tablet Take 1 tablet (25 mg total) by mouth daily. 30 tablet 0  . Multiple Vitamins-Minerals (MULTIVITAMIN WITH MINERALS) tablet Take 1 tablet by mouth daily.    . nitroGLYCERIN (NITROSTAT) 0.4 MG SL tablet Place 1 tablet (0.4 mg total) under the tongue every 5 (five) minutes as needed for chest pain (CP or SOB). 25 tablet 2  . pantoprazole (PROTONIX) 40 MG tablet Take 1 tablet (40 mg total) by mouth 2 (two) times daily before a meal. 60 tablet 0  . predniSONE (DELTASONE) 10 MG tablet Take 3 tabs daily 2 days, then 2 tabs daily 2 days, then 1 tab daily 2 days, then stop. 12 tablet 0  . Pseudoeph-CPM-DM-APAP (TYLENOL COLD PO) Take 2 tablets by mouth 3 (three) times daily as needed (cold symptoms).     No current facility-administered medications for this visit.    No Known Allergies  Social History   Social History  . Marital Status: Married    Spouse Name: renee  . Number of Children: 1  . Years of Education: college   Occupational History  . 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    Social History Main Topics  .  Smoking status: Never Smoker   . Smokeless tobacco: Current User    Types: Chew  . Alcohol Use: Yes     Comment: couple of 6 packs on the weekends  . Drug Use: No  . Sexual Activity: Not on file   Other Topics Concern  . Not on file   Social History Narrative     Review of Systems: General: negative for chills, fever, night sweats or weight changes.  Cardiovascular: negative for chest pain, dyspnea on exertion, edema, orthopnea, palpitations, paroxysmal nocturnal dyspnea or shortness of breath Dermatological: negative for rash Respiratory: negative for cough or wheezing Urologic:  negative for hematuria Abdominal: negative for nausea, vomiting, diarrhea, bright red blood per rectum, melena, or hematemesis Neurologic: negative for visual changes, syncope, or dizziness All other systems reviewed and are otherwise negative except as noted above.    Blood pressure 128/84, pulse 71, height 5\' 10"  (1.778 m), weight 121.201 kg (267 lb 3.2 oz).  General appearance: alert, cooperative and no distress. He is obese. Neck: no carotid bruit and no JVD Lungs: clear to auscultation bilaterally Heart: regular rate and rhythm, S1, S2 normal, no murmur, click, rub or gallop Extremities: no LEE Pulses: 2+ and symmetric Skin: warm and dry Neurologic: Grossly normal   ASSESSMENT AND PLAN:   1. CAD: Status post inferior STEMI.  Status post PCI with drug-eluting stent to RCA. No residual CAD. He denies recurrent chest pain. Ideally would continue DAPT for one year but ASA held due to recent gastric bleeding. Now on Plavix monotherapy.  Continue high potency statin, beta blocker and ACE inhibitor.   2. HTN: well-controlled.  3. Chronic Diastolic CHF: EF [id number]. He is euvolemic on physical exam. No dyspnea. Continue BB and ACE-I. Low sodium diet advised.   4. HLD: continue high dose Lipitor, 80 mg daily. Will request primary care to check a lipid panel today with his lab work.   5. Anemia: secondary to chronic GI blood loss.  6. UGI bleed with multiple gastric polyps and nodules.   I discussed at length with patient and wife issues of antiplatelet therapy, risk of stent thrombosis and GI bleeding. He is now 2 months out from MI and his stent has not had time to re-endothelialize. He is at high risk for stent thrombosis with interrupted antiplatelet therapy. Now on P2Y12 inhibitor as monotherapy due to recent GI bleed. He is being considered for surgery with gastrectomy. I think his overall surgical risk is acceptable but I don't feel we can safely stop Plavix at this time. Ideally we  would wait 6 months post MI to stop P2Y12 therapy and would still use ASA at that point. If he were to need surgery more urgently due to recurrent bleeding I would consider doing surgery on Plavix. This will carry a higher risk of perioperative bleeding but this could be managed with blood products. On the other hand if he had stent thrombosis in the perioperative period this could have catastrophic results. The patient is planning to get a second surgical opinion with Dr. Selinda Eon at [facility] in May. We will await that evaluation. CBCs are being followed closely by primary care.    PLAN  F/u with Dr. Martinique in 2 months.   Peter Martinique MD,FACC  11/20/2015 10:31 AM

## 2015-11-21 LAB — H. PYLORI ANTIBODY, IGG

## 2015-12-09 ENCOUNTER — Encounter (HOSPITAL_COMMUNITY): Payer: Self-pay | Admitting: *Deleted

## 2015-12-14 ENCOUNTER — Telehealth: Payer: Self-pay | Admitting: Gastroenterology

## 2015-12-14 NOTE — Telephone Encounter (Signed)
Left message on machine to call back  

## 2015-12-14 NOTE — Telephone Encounter (Signed)
FYI:  Pt has to work out of town and also saw Dr  Dossie Der at [facility] and will have the colonoscopy at [facility] if Dr Dossie Der recommends.  [facility] has requested the pathology slides from [facility] and will make a decision after reviewing.  Procedure has been cancelled.

## 2015-12-15 ENCOUNTER — Ambulatory Visit (HOSPITAL_COMMUNITY)
Admission: RE | Admit: 2015-12-15 | Payer: Managed Care, Other (non HMO) | Source: Ambulatory Visit | Admitting: Gastroenterology

## 2015-12-15 SURGERY — COLONOSCOPY WITH PROPOFOL
Anesthesia: Monitor Anesthesia Care

## 2015-12-29 ENCOUNTER — Telehealth: Payer: Self-pay | Admitting: Cardiology

## 2015-12-29 NOTE — Telephone Encounter (Signed)
New message     Provider calling wants to speak  Dr. Martinique - did not disclose any information

## 2015-12-29 NOTE — Telephone Encounter (Signed)
Will forward to dr Martinique

## 2016-01-12 ENCOUNTER — Ambulatory Visit: Payer: Managed Care, Other (non HMO) | Admitting: Gastroenterology

## 2016-01-29 ENCOUNTER — Ambulatory Visit: Payer: Managed Care, Other (non HMO) | Admitting: Cardiology

## 2016-03-04 ENCOUNTER — Ambulatory Visit (INDEPENDENT_AMBULATORY_CARE_PROVIDER_SITE_OTHER): Payer: Managed Care, Other (non HMO) | Admitting: Nurse Practitioner

## 2016-03-04 ENCOUNTER — Encounter: Payer: Self-pay | Admitting: Nurse Practitioner

## 2016-03-04 VITALS — BP 122/90 | HR 65 | Ht 70.0 in | Wt 285.2 lb

## 2016-03-04 DIAGNOSIS — E785 Hyperlipidemia, unspecified: Secondary | ICD-10-CM | POA: Diagnosis not present

## 2016-03-04 DIAGNOSIS — I2510 Atherosclerotic heart disease of native coronary artery without angina pectoris: Secondary | ICD-10-CM | POA: Diagnosis not present

## 2016-03-04 NOTE — Progress Notes (Signed)
Office Visit    Patient Name: James Mullins Date of Encounter: 03/04/2016  Primary Care Provider:  Jonathon Bellows, MD Primary Cardiologist:  P. Martinique, MD   Chief Complaint    55 year old male with prior history of inferior MI status post RCA stenting K by GI bleed in late March who presents for follow-up.  Past Medical History    Past Medical History:  Diagnosis Date  . CAD (coronary artery disease)    a. 09/2015 Inf STEMI/PCI: LM nl, LAD nl, RI nl, LCX nl, RCA 32m (3.5 x 16 Promus Premier MR DES).  . Chronic combined systolic and diastolic CHF (congestive heart failure) ([place]) 09/2015   a. 09/2009 EF 35-45% by LV gram @ time of MI;  b. 09/2015 Echo: EF 40-45%, Gr2 DD, mild MR, mod-sev dil LA, triv TR, PASP 53mmHg.  Marland Kitchen Hyperlipidemia   . Iron deficiency anemia 09/2015  . Ischemic cardiomyopathy    a. 09/2015 Echo: EF 40-45%  . MI (myocardial infarction) ([place]) 2//112017  . Obesity (BMI 30.0-34.9) 2015  . OSA on CPAP 08/2013   snoring. sleep study confirmed.  Dr Asencion Partridge Dohmeier, pt does not knoe cpap settings  . Upper GI bleed 10/29/2015   a. 09/2015 EGD: multiple subepithelial gastric nodules with overlying multiple, oozing ulcers.  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 Now followed @ [facility] wth plan for repeat EGD/colonoscopy.   Past Surgical History:  Procedure Laterality Date  . CARDIAC CATHETERIZATION N/A 09/12/2015   Left Heart Cath and Coronary Angiography; Dr Peter M Martinique, MD;   . CARDIAC CATHETERIZATION N/A 09/12/2015   placement Promus stent to mid RCA, Dr Peter M Martinique, MD  . ESOPHAGOGASTRODUODENOSCOPY N/A 10/29/2015   Procedure: ESOPHAGOGASTRODUODENOSCOPY (EGD);  Surgeon: Mauri Pole, MD;  Location: [facility];  Service: Endoscopy;  Laterality: N/A;  . EUS N/A 11/05/2015   Procedure: FULL UPPER ENDOSCOPIC ULTRASOUND (EUS) RADIAL;  Surgeon: Milus Banister, MD;  Location: [facility] END[facility];  Service: Endoscopy;  Laterality: N/A;    Allergies  No Known Allergies  History of  Present Illness    55 year old male with the above Past medical history including obesity, sleep apnea, hyperlipidemia, and coronary artery disease status post inferior myocardial infarction on 09/12/2015 requiring drug-eluting stent placement to the right coronary artery. In late March, he was admitted with anemia and upper GI bleed. At that time, he was on brilinta and this was held so that EGD could be performed. EGD demonstrated diffuse gastric polyps nodules with increased friability and easy bleeding. Since GI bleed, he has been on Plavix monotherapy. He has now been evaluated at [facility] by GI and surgery with plans for EGD and colonoscopy later this month. As of August 11, he will have completed 6 months of Plavix therapy.  From a cardiac standpoint, he has been doing well without chest pain or dyspnea. He walks often without limitations. He denies PND, orthopnea, dizziness, 60, edema, or early satiety. From a GI standpoint, he has also been doing well without any recurrent bleeding.   Home Medications    Prior to Admission medications   Medication Sig Start Date End Date Taking? Authorizing Provider  atorvastatin (LIPITOR) 80 MG tablet Take 1 tablet (80 mg total) by mouth daily at 6 PM. 09/14/15  Yes Brittainy Erie Noe, PA-C  carvedilol (COREG) 6.25 MG tablet Take 0.5 tablets (3.125 mg total) by mouth 2 (two) times daily with a meal. 09/21/15  Yes Brittainy Erie Noe, PA-C  clopidogrel (PLAVIX) 75 MG  tablet Take 1 tablet (75 mg total) by mouth daily. 11/07/15  Yes Modena Jansky, MD  ferrous sulfate 325 (65 FE) MG tablet Take 1 tablet (325 mg total) by mouth 3 (three) times daily with meals. 11/06/15  Yes Modena Jansky, MD  losartan (COZAAR) 25 MG tablet Take 1 tablet (25 mg total) by mouth daily. 11/06/15  Yes Modena Jansky, MD  Multiple Vitamins-Minerals (MULTIVITAMIN WITH MINERALS) tablet Take 1 tablet by mouth daily.   Yes Historical Provider, MD  nitroGLYCERIN (NITROSTAT) 0.4 MG SL tablet  Place 1 tablet (0.4 mg total) under the tongue every 5 (five) minutes as needed for chest pain (CP or SOB). 09/14/15  Yes Brittainy Erie Noe, PA-C  pantoprazole (PROTONIX) 40 MG tablet Take 1 tablet (40 mg total) by mouth 2 (two) times daily before a meal. 11/06/15  Yes Modena Jansky, MD    Review of Systems    As above, he has been doing well without chest pain, palpitations, dyspnea, PND, orthopnea, dizziness, syncope, edema, or early satiety. He has had no further GI bleeding.  All other systems reviewed and are otherwise negative except as noted above.  Physical Exam    VS:  BP 122/90   Pulse 65   Ht 5\' 10"  (1.778 m)   Wt 285 lb 3.2 oz (129.4 kg)   BMI 40.92 kg/m  , BMI Body mass index is 40.92 kg/m. GEN: Well nourished, well developed, in no acute distress.  HEENT: normal.  Neck: Supple, no JVD, carotid bruits, or masses. Cardiac: RRR, no murmurs, rubs, or gallops. No clubbing, cyanosis, edema.  Radials/DP/PT 2+ and equal bilaterally.  Respiratory:  Respirations regular and unlabored, clear to auscultation bilaterally. GI: Soft, nontender, nondistended, BS + x 4. MS: no deformity or atrophy. Skin: warm and dry, no rash. Neuro:  Strength and sensation are intact. Psych: Normal affect.  Accessory Clinical Findings    ECG - Reveals sinus rhythm, 65, incomplete right bundle branch block-no acute ST or T changes.  Assessment & Plan    1.  Coronary artery disease status post inferior ST segment elevation myocardial infarction: Patient is status post MI in February 2017 with successful drug-eluting stent placement to the right coronary artery. He has been doing well from a cardiac standpoint without any chest pain or dyspnea. He is currently on Plavix monotherapy in the setting of GI bleed in March. He has been doing well from a GI standpoint has not had any additional bleeding. He has been evaluated by GI and surgery at [facility] and has follow-up with GI there this afternoon. He  anticipates that he will require EGD and colonoscopy later this month. In that setting, he will have to come off of Plavix. August 11 will be the six-month mark following his MI. Following that date, he will be able to come off of Plavix for his GI procedures however we would recommend initiation of aspirin at that time and further recommend continuation of aspirin throughout perioperative period with plan to resume Plavix once okay with GI. He otherwise remains on statin, beta blocker, and ARB therapy and should remain on these therapies throughout the perioperative period.  2. Upper GI bleed: See discussion above. Patient follows with GI at [facility] today.  3. Hypokalemia: LDL was 89 in April. He remains on high potency atorvastatin therapy.  4. Morbid obesity: Patient has been walking. Encouraged him to increase his activity and also limit his calories with a goal of weight loss.  5.  Obstructive sleep apnea: He uses CPAP.  6.  Disposition: Follow-up with Dr. Martinique in 6 months or sooner if necessary.   Murray Hodgkins, NP 03/04/2016, 8:05 AM

## 2016-03-04 NOTE — Patient Instructions (Signed)
Ignacia Bayley, NP, recommends that you schedule a follow-up appointment in 6 months with Dr Martinique. You will receive a reminder letter in the mail two months in advance. If you don't receive a letter, please call our office to schedule the follow-up appointment.  If you need a refill on your cardiac medications before your next appointment, please call your pharmacy.

## 2016-03-30 ENCOUNTER — Telehealth: Payer: Self-pay | Admitting: Cardiology

## 2016-03-30 NOTE — Telephone Encounter (Signed)
He may hold Plavix for one week prior to endoscopic procedures but should start ASA 81 mg daily  Peter Martinique MD, [facility]

## 2016-03-30 NOTE — Telephone Encounter (Signed)
Patient's wife is calling, ok per DPR. She states her husband will be having an endoscopy with biopsy on Friday, 04/01/16 with a doctor at [facility]. She is unsure of the doctor's name. They asked her to call and see if and how long the patient could be off his Plavix. Patient had heart cath with stenting in 09/12/15.  Will route to Dr Martinique for advice and his nurse, Malachy Mood.

## 2016-03-30 NOTE — Telephone Encounter (Signed)
New message        Pt wife calling to find out when the pt can stop taking his plavix before his procedure. Please call.

## 2016-03-30 NOTE — Telephone Encounter (Signed)
No answer. Left message to call back.   

## 2016-03-31 NOTE — Telephone Encounter (Signed)
Returned call, gave instructions on Plavix hold. Wife of patient just wanted to clarify whether patient would need to continue ASA 81mg  indefinitely or just while off Plavix.  Routed to Dr. Martinique for recommendation.

## 2016-03-31 NOTE — Telephone Encounter (Signed)
Left message to call back  

## 2016-03-31 NOTE — Telephone Encounter (Signed)
Clarification of instructions relayed to caller. Aware to call if further questions.

## 2016-03-31 NOTE — Telephone Encounter (Signed)
Continue ASA just while off Plavix.  Nishika Parkhurst Martinique MD, [facility]

## 2016-03-31 NOTE — Telephone Encounter (Signed)
James Mullins is returning a call  . Please call

## 2016-04-02 ENCOUNTER — Other Ambulatory Visit: Payer: Self-pay | Admitting: Cardiology

## 2016-04-05 NOTE — Telephone Encounter (Signed)
Rx(s) sent to pharmacy electronically.  

## 2016-05-27 ENCOUNTER — Other Ambulatory Visit: Payer: Self-pay | Admitting: Cardiology

## 2016-06-09 NOTE — Progress Notes (Signed)
Office Visit    Patient Name: James Mullins Date of Encounter: 06/12/2016  Primary Care Provider:  Jonathon Bellows, MD Primary Cardiologist:  P. Martinique, MD   Chief Complaint    55 year old male with prior history of inferior MI status post RCA stenting complicated by GI bleed in late March who presents for follow-up.  Past Medical History    Past Medical History:  Diagnosis Date  . CAD (coronary artery disease)    a. 09/2015 Inf STEMI/PCI: LM nl, LAD nl, RI nl, LCX nl, RCA 65m (3.5 x 16 Promus Premier MR DES).  . Chronic combined systolic and diastolic CHF (congestive heart failure) ([place]) 09/2015   a. 09/2009 EF 35-45% by LV gram @ time of MI;  b. 09/2015 Echo: EF 40-45%, Gr2 DD, mild MR, mod-sev dil LA, triv TR, PASP 17mmHg.  Marland Kitchen Hyperlipidemia   . Iron deficiency anemia 09/2015  . Ischemic cardiomyopathy    a. 09/2015 Echo: EF 40-45%  . MI (myocardial infarction) 2//112017  . Obesity (BMI 30.0-34.9) 2015  . OSA on CPAP 08/2013   snoring. sleep study confirmed.  Dr Asencion Partridge Dohmeier, pt does not knoe cpap settings  . Upper GI bleed 10/29/2015   a. 09/2015 EGD: multiple subepithelial gastric nodules with overlying multiple, oozing ulcers.  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 Now followed @ [facility] wth plan for repeat EGD/colonoscopy.   Past Surgical History:  Procedure Laterality Date  . CARDIAC CATHETERIZATION N/A 09/12/2015   Left Heart Cath and Coronary Angiography; Dr Peter M Martinique, MD;   . CARDIAC CATHETERIZATION N/A 09/12/2015   placement Promus stent to mid RCA, Dr Peter M Martinique, MD  . ESOPHAGOGASTRODUODENOSCOPY N/A 10/29/2015   Procedure: ESOPHAGOGASTRODUODENOSCOPY (EGD);  Surgeon: Mauri Pole, MD;  Location: [facility];  Service: Endoscopy;  Laterality: N/A;  . EUS N/A 11/05/2015   Procedure: FULL UPPER ENDOSCOPIC ULTRASOUND (EUS) RADIAL;  Surgeon: Milus Banister, MD;  Location: [facility];  Service: Endoscopy;  Laterality: N/A;    Allergies  No Known  Allergies  History of Present Illness    55 year old male with the above Past medical history including obesity, sleep apnea, hyperlipidemia, and coronary artery disease status post inferior myocardial infarction on 09/12/2015 requiring drug-eluting stent placement to the right coronary artery. In late March, he was admitted with anemia and upper GI bleed. At that time, he was on brilinta and this was held so that EGD could be performed. EGD demonstrated diffuse gastric polyps, nodules with increased friability and easy bleeding. Since GI bleed, he has been on Plavix monotherapy. He has  been evaluated at [facility] by GI and surgery and plans were for EGD and colonoscopy in August after 6 months of Plavix.  He did undergo follow up EGD with biopsies in August. This demonstrated multiple gastric polyps. Biopsies c/w inflammatory polyps. While in [facility] on September 26 he developed a massive GI bleed and required 9 units of PRBCs. He as airlifted to [facility] and underwent total gastrectomy with esophagojejunostomy, Roux en Y jejunojejunostomy, and feeding jejunostomy. He had no cardiac complications other than some bradycardia and all his cardiac meds were held. He was given tube feedings. 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 He is schedule to see oncology on 06/27/16. He states he was unable to tolerate tube feeds and has been taking po's. He has lost 38 lbs.   From a cardiac standpoint, he is doing well without chest pain or dyspnea.  He  denies PND, orthopnea, dizziness,  Edema.   Home Medications    Prior to Admission medications   Medication Sig Start Date End Date Taking? Authorizing Provider  atorvastatin (LIPITOR) 80 MG tablet Take 1 tablet (80 mg total) by mouth daily at 6 PM. 09/14/15  Yes Brittainy Erie Noe, PA-C  carvedilol (COREG) 6.25 MG tablet Take 0.5 tablets (3.125 mg total) by mouth 2 (two) times daily with a meal.  09/21/15  Yes Brittainy Erie Noe, PA-C  clopidogrel (PLAVIX) 75 MG tablet Take 1 tablet (75 mg total) by mouth daily. 11/07/15  Yes Modena Jansky, MD  ferrous sulfate 325 (65 FE) MG tablet Take 1 tablet (325 mg total) by mouth 3 (three) times daily with meals. 11/06/15  Yes Modena Jansky, MD  losartan (COZAAR) 25 MG tablet Take 1 tablet (25 mg total) by mouth daily. 11/06/15  Yes Modena Jansky, MD  Multiple Vitamins-Minerals (MULTIVITAMIN WITH MINERALS) tablet Take 1 tablet by mouth daily.   Yes Historical Provider, MD  nitroGLYCERIN (NITROSTAT) 0.4 MG SL tablet Place 1 tablet (0.4 mg total) under the tongue every 5 (five) minutes as needed for chest pain (CP or SOB). 09/14/15  Yes Brittainy Erie Noe, PA-C  pantoprazole (PROTONIX) 40 MG tablet Take 1 tablet (40 mg total) by mouth 2 (two) times daily before a meal. 11/06/15  Yes Modena Jansky, MD    Review of Systems    As noted in HPI.  All other systems reviewed and are otherwise negative except as noted above.  Physical Exam    VS:  BP (!) 130/92   Pulse 79   Ht 5\' 10"  (1.778 m)   Wt 247 lb 12.8 oz (112.4 kg)   BMI 35.56 kg/m  , BMI Body mass index is 35.56 kg/m. GEN: Well nourished, well developed, in no acute distress.  HEENT: normal.  Neck: Supple, no JVD, carotid bruits, or masses. Cardiac: RRR, no murmurs, rubs, or gallops. No clubbing, cyanosis, edema.  Radials/DP/PT 2+ and equal bilaterally.  Respiratory:  Respirations regular and unlabored, clear to auscultation bilaterally. GI: Soft, nontender, with midline surgical scar with staples. Healing well. jejunostomy tube in place. nondistended, BS + x 4. MS: no deformity or atrophy. Skin: warm and dry, no rash. Neuro:  Strength and sensation are intact. Psych: Normal affect.  Accessory Clinical Findings     Lab Results  Component Value Date   WBC 5.9 11/06/2015   HGB 11.4 (L) 11/16/2015   HCT 35.2 (L) 11/16/2015   PLT 290 11/06/2015   GLUCOSE 97 11/05/2015   CHOL  178 11/20/2015   TRIG 211 (H) 11/20/2015   HDL 47 11/20/2015   LDLCALC 89 11/20/2015   ALT 17 10/29/2015   AST 19 10/29/2015   NA 141 11/05/2015   K 4.0 11/05/2015   CL 107 11/05/2015   CREATININE 0.92 11/05/2015   BUN 11 11/05/2015   CO2 27 11/05/2015   TSH 2.228 09/12/2015   INR 1.10 10/29/2015   HGBA1C 5.4 09/12/2015  Last labs dated 06/06/16: Hgb- 10.1, CMET normal.   Assessment & Plan    1.  Coronary artery disease status post inferior ST segment elevation myocardial infarction: Patient is status post MI in February 2017 with successful drug-eluting stent placement to the right coronary artery. He has been doing well from a cardiac standpoint without any chest pain or dyspnea. He is currently on ASA 81 mg  monotherapy in the setting of recent GI bleed and surgery.  I would  recommend he continue with ASA only. I think his risk of late stent thrombosis is low at this point. I have recommended he resume  statin, low dose beta blocker, and low dose ARB therapy. He will monitor his BP and HR closely.   2. Upper GI bleed: See discussion above. S/p total gastrectomy with finding of neuroendocrine tumors with lymphatic spread. Follow up with oncology.  3. Hypokalemia: LDL was 89 in April. He remains on high potency atorvastatin therapy.  4. Morbid obesity: with above issues he has lost 38 lbs.   5. Obstructive sleep apnea: He uses CPAP.  6.  Disposition: Follow-up with me in 3 months or sooner if necessary.   Peter Martinique, MD,FACC 06/12/2016, 7:28 PM

## 2016-06-10 ENCOUNTER — Encounter: Payer: Self-pay | Admitting: Cardiology

## 2016-06-10 ENCOUNTER — Ambulatory Visit (INDEPENDENT_AMBULATORY_CARE_PROVIDER_SITE_OTHER): Payer: Managed Care, Other (non HMO) | Admitting: Cardiology

## 2016-06-10 VITALS — BP 130/92 | HR 79 | Ht 70.0 in | Wt 247.8 lb

## 2016-06-10 DIAGNOSIS — I5042 Chronic combined systolic (congestive) and diastolic (congestive) heart failure: Secondary | ICD-10-CM

## 2016-06-10 DIAGNOSIS — I2510 Atherosclerotic heart disease of native coronary artery without angina pectoris: Secondary | ICD-10-CM | POA: Diagnosis not present

## 2016-06-10 DIAGNOSIS — C162 Malignant neoplasm of body of stomach: Secondary | ICD-10-CM | POA: Diagnosis not present

## 2016-06-10 DIAGNOSIS — D62 Acute posthemorrhagic anemia: Secondary | ICD-10-CM | POA: Diagnosis not present

## 2016-06-10 DIAGNOSIS — C169 Malignant neoplasm of stomach, unspecified: Secondary | ICD-10-CM | POA: Insufficient documentation

## 2016-06-10 MED ORDER — ATORVASTATIN CALCIUM 80 MG PO TABS: 80.0000 mg | ORAL_TABLET | Freq: Every day | ORAL | 8 refills | Status: DC

## 2016-06-10 MED ORDER — CARVEDILOL 6.25 MG PO TABS: 3.1250 mg | ORAL_TABLET | Freq: Two times a day (BID) | ORAL | 8 refills | Status: DC

## 2016-06-10 MED ORDER — LOSARTAN POTASSIUM 25 MG PO TABS: 25.0000 mg | ORAL_TABLET | Freq: Every day | ORAL | 8 refills | Status: DC

## 2016-06-10 NOTE — Patient Instructions (Signed)
Resume lipitor 80 mg daily, Coreg 3.125 mg twice a day, losartan 25 mg daily  Continue ASA 81 mg daily  I will see you in 3 months.

## 2016-09-01 ENCOUNTER — Other Ambulatory Visit: Payer: Self-pay | Admitting: Cardiology

## 2016-09-01 DIAGNOSIS — D62 Acute posthemorrhagic anemia: Secondary | ICD-10-CM

## 2016-09-08 NOTE — Progress Notes (Signed)
Office Visit    Patient Name: James Mullins Date of Encounter: 09/09/2016  Primary Care Provider:  Jonathon Bellows, MD Primary Cardiologist:  P. Martinique, MD   Chief Complaint    56 year old male with prior history of inferior MI status post RCA stenting complicated by GI bleed in  March 2017 who presents for follow-up.  Past Medical History    Past Medical History:  Diagnosis Date  . CAD (coronary artery disease)    a. 09/2015 Inf STEMI/PCI: LM nl, LAD nl, RI nl, LCX nl, RCA 33m (3.5 x 16 Promus Premier MR DES).  . Chronic combined systolic and diastolic CHF (congestive heart failure) ([place]) 09/2015   a. 09/2009 EF 35-45% by LV gram @ time of MI;  b. 09/2015 Echo: EF 40-45%, Gr2 DD, mild MR, mod-sev dil LA, triv TR, PASP 64mmHg.  Marland Kitchen Hyperlipidemia   . Iron deficiency anemia 09/2015  . Ischemic cardiomyopathy    a. 09/2015 Echo: EF 40-45%  . MI (myocardial infarction) 2//112017  . Obesity (BMI 30.0-34.9) 2015  . OSA on CPAP 08/2013   snoring. sleep study confirmed.  Dr Asencion Partridge Dohmeier, pt does not knoe cpap settings  . Upper GI bleed 10/29/2015   a. 09/2015 EGD: multiple subepithelial gastric nodules with overlying multiple, oozing ulcers.  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 Now followed @ [facility] wth plan for repeat EGD/colonoscopy.   Past Surgical History:  Procedure Laterality Date  . CARDIAC CATHETERIZATION N/A 09/12/2015   Left Heart Cath and Coronary Angiography; Dr Karyna Bessler M Martinique, MD;   . CARDIAC CATHETERIZATION N/A 09/12/2015   placement Promus stent to mid RCA, Dr Lakeasha Petion M Martinique, MD  . ESOPHAGOGASTRODUODENOSCOPY N/A 10/29/2015   Procedure: ESOPHAGOGASTRODUODENOSCOPY (EGD);  Surgeon: Mauri Pole, MD;  Location: [facility];  Service: Endoscopy;  Laterality: N/A;  . EUS N/A 11/05/2015   Procedure: FULL UPPER ENDOSCOPIC ULTRASOUND (EUS) RADIAL;  Surgeon: Milus Banister, MD;  Location: [facility];  Service: Endoscopy;  Laterality: N/A;    Allergies  No Known  Allergies  History of Present Illness    56 year old male with the above Past medical history including obesity, sleep apnea, hyperlipidemia, and coronary artery disease status post inferior myocardial infarction on 09/12/2015 requiring drug-eluting stent placement to the right coronary artery. In late March, he was admitted with anemia and upper GI bleed. At that time, he was on brilinta and this was held so that EGD could be performed. EGD demonstrated diffuse gastric polyps, nodules with increased friability and easy bleeding. Since GI bleed, he has been on Plavix monotherapy. He was   evaluated at [facility] by GI and surgery and plans were for EGD and colonoscopy in August after 6 months of Plavix.  He did undergo follow up EGD with biopsies in August. This demonstrated multiple gastric polyps. Biopsies c/w inflammatory polyps. While in [facility] on September 26 he developed a massive GI bleed and required 9 units of PRBCs. He was airlifted to [facility] and underwent total gastrectomy with esophagojejunostomy, Roux en Y jejunojejunostomy, and feeding jejunostomy. He had no cardiac complications other than some bradycardia and all his cardiac meds were held. He was given tube feedings. 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 He saw oncology on 06/27/16. PET scan showed no metastatic spread. He was resumed on statin, beta blocker and ASA.    From a cardiac standpoint, he is doing well without chest pain or dyspnea.  He denies PND, orthopnea, dizziness,  Edema. He does have a large ventral hernia and is scheduled for repair. He reports primary MD just did lab work last week and told him his iron is low.    Home Medications    Prior to Admission medications   Medication Sig Start Date End Date Taking? Authorizing Provider  atorvastatin (LIPITOR) 80 MG tablet Take 1 tablet (80 mg total) by mouth daily at 6 PM. 09/14/15  Yes Brittainy Erie Noe, PA-C  carvedilol (COREG) 6.25 MG tablet Take 0.5 tablets (3.125 mg total) by mouth 2 (two) times daily with a meal. 09/21/15  Yes Brittainy Erie Noe, PA-C  clopidogrel (PLAVIX) 75 MG tablet Take 1 tablet (75 mg total) by mouth daily. 11/07/15  Yes Modena Jansky, MD  ferrous sulfate 325 (65 FE) MG tablet Take 1 tablet (325 mg total) by mouth 3 (three) times daily with meals. 11/06/15  Yes Modena Jansky, MD  losartan (COZAAR) 25 MG tablet Take 1 tablet (25 mg total) by mouth daily. 11/06/15  Yes Modena Jansky, MD  Multiple Vitamins-Minerals (MULTIVITAMIN WITH MINERALS) tablet Take 1 tablet by mouth daily.   Yes Historical Provider, MD  nitroGLYCERIN (NITROSTAT) 0.4 MG SL tablet Place 1 tablet (0.4 mg total) under the tongue every 5 (five) minutes as needed for chest pain (CP or SOB). 09/14/15  Yes Brittainy Erie Noe, PA-C  pantoprazole (PROTONIX) 40 MG tablet Take 1 tablet (40 mg total) by mouth 2 (two) times daily before a meal. 11/06/15  Yes Modena Jansky, MD    Review of Systems    As noted in HPI.  All other systems reviewed and are otherwise negative except as noted above.  Physical Exam    VS:  BP 118/72 (BP Location: Left Arm, Cuff Size: Normal)   Pulse 70   Ht 5\' 10"  (1.778 m)   Wt 233 lb 9.6 oz (106 kg)   BMI 33.52 kg/m  , BMI Body mass index is 33.52 kg/m. GEN: Well nourished, well developed, in no acute distress.  HEENT: normal.  Neck: Supple, no JVD, carotid bruits, or masses. Cardiac: RRR, no murmurs, rubs, or gallops. No clubbing, cyanosis, edema.  Radials/DP/PT 2+ and equal bilaterally.  Respiratory:  Respirations regular and unlabored, clear to auscultation bilaterally. GI: Soft, nontender, with midline surgical scar with staples. Healing well. jejunostomy tube in place. nondistended, BS + x 4. MS: no deformity or atrophy. Skin: warm and dry, no rash. Neuro:  Strength and sensation are intact. Psych: Normal affect.  Accessory Clinical Findings     Lab  Results  Component Value Date   WBC 5.9 11/06/2015   HGB 11.4 (L) 11/16/2015   HCT 35.2 (L) 11/16/2015   PLT 290 11/06/2015   GLUCOSE 97 11/05/2015   CHOL 178 11/20/2015   TRIG 211 (H) 11/20/2015   HDL 47 11/20/2015   LDLCALC 89 11/20/2015   ALT 17 10/29/2015   AST 19 10/29/2015   NA 141 11/05/2015   K 4.0 11/05/2015   CL 107 11/05/2015   CREATININE 0.92 11/05/2015   BUN 11 11/05/2015   CO2 27 11/05/2015   TSH 2.228 09/12/2015   INR 1.10 10/29/2015   HGBA1C 5.4 09/12/2015  Last labs dated 06/06/16: Hgb- 10.1, CMET normal.   Assessment & Plan    1.  Coronary artery disease status post inferior ST segment elevation myocardial infarction: Patient is status post MI in February 2017 with successful drug-eluting stent placement to the right coronary artery. He has been doing well  from a cardiac standpoint without any chest pain or dyspnea. He is currently on ASA 81 mg  monotherapy in the setting of recent GI bleed and surgery.   I have recommended he continue statin, low dose beta blocker, and low dose ARB therapy. I will follow up in 6 months.  2. Upper GI bleed:  S/p total gastrectomy with finding of neuroendocrine tumors with lymphatic spread. Followed with serial scans.   3. Hypercholesterolemia: LDL was 89 in April. He remains on high potency atorvastatin therapy.  4. Morbid obesity  5. Obstructive sleep apnea: He uses CPAP.  6.  Disposition: Follow-up with me in 6 months or sooner if necessary. He is cleared to have ventral hernia repair.   Jaidy Cottam Martinique, MD,FACC 09/09/2016, 11:14 AM

## 2016-09-09 ENCOUNTER — Ambulatory Visit (INDEPENDENT_AMBULATORY_CARE_PROVIDER_SITE_OTHER): Payer: Managed Care, Other (non HMO) | Admitting: Cardiology

## 2016-09-09 ENCOUNTER — Encounter: Payer: Self-pay | Admitting: Cardiology

## 2016-09-09 VITALS — BP 118/72 | HR 70 | Ht 70.0 in | Wt 233.6 lb

## 2016-09-09 DIAGNOSIS — C162 Malignant neoplasm of body of stomach: Secondary | ICD-10-CM

## 2016-09-09 DIAGNOSIS — E7800 Pure hypercholesterolemia, unspecified: Secondary | ICD-10-CM | POA: Diagnosis not present

## 2016-09-09 DIAGNOSIS — I2510 Atherosclerotic heart disease of native coronary artery without angina pectoris: Secondary | ICD-10-CM

## 2016-09-09 NOTE — Patient Instructions (Addendum)
Continue your current therapy  I will see you in 6 months.   

## 2016-09-29 ENCOUNTER — Other Ambulatory Visit: Payer: Self-pay | Admitting: Cardiology

## 2016-09-29 DIAGNOSIS — D62 Acute posthemorrhagic anemia: Secondary | ICD-10-CM

## 2016-09-30 NOTE — Telephone Encounter (Signed)
Rx(s) sent to pharmacy electronically.  

## 2017-02-22 ENCOUNTER — Other Ambulatory Visit: Payer: Self-pay

## 2017-02-22 MED ORDER — LOSARTAN POTASSIUM 25 MG PO TABS: 25.0000 mg | ORAL_TABLET | Freq: Every day | ORAL | 8 refills | Status: DC

## 2017-05-19 ENCOUNTER — Other Ambulatory Visit: Payer: Self-pay | Admitting: Cardiology

## 2017-10-18 ENCOUNTER — Other Ambulatory Visit: Payer: Self-pay | Admitting: Cardiology

## 2017-10-18 DIAGNOSIS — D62 Acute posthemorrhagic anemia: Secondary | ICD-10-CM

## 2017-11-21 ENCOUNTER — Other Ambulatory Visit: Payer: Self-pay | Admitting: Cardiology

## 2017-11-21 NOTE — Telephone Encounter (Signed)
REFIL 

## 2017-12-22 ENCOUNTER — Other Ambulatory Visit: Payer: Self-pay | Admitting: Cardiology

## 2017-12-22 NOTE — Telephone Encounter (Signed)
Rx request sent to pharmacy.  

## 2017-12-28 ENCOUNTER — Other Ambulatory Visit: Payer: Self-pay | Admitting: Cardiology

## 2018-02-11 ENCOUNTER — Other Ambulatory Visit: Payer: Self-pay | Admitting: Cardiology

## 2018-02-14 ENCOUNTER — Other Ambulatory Visit: Payer: Self-pay | Admitting: Cardiology

## 2018-02-25 ENCOUNTER — Other Ambulatory Visit: Payer: Self-pay | Admitting: Cardiology

## 2018-03-06 ENCOUNTER — Ambulatory Visit: Payer: Managed Care, Other (non HMO) | Admitting: Physician Assistant

## 2018-03-06 ENCOUNTER — Telehealth: Payer: Self-pay | Admitting: Cardiology

## 2018-03-06 ENCOUNTER — Encounter: Payer: Self-pay | Admitting: Physician Assistant

## 2018-03-06 VITALS — BP 117/70 | HR 58 | Ht 70.0 in | Wt 185.8 lb

## 2018-03-06 DIAGNOSIS — I5042 Chronic combined systolic (congestive) and diastolic (congestive) heart failure: Secondary | ICD-10-CM

## 2018-03-06 DIAGNOSIS — I2510 Atherosclerotic heart disease of native coronary artery without angina pectoris: Secondary | ICD-10-CM

## 2018-03-06 DIAGNOSIS — Z79899 Other long term (current) drug therapy: Secondary | ICD-10-CM

## 2018-03-06 DIAGNOSIS — E785 Hyperlipidemia, unspecified: Secondary | ICD-10-CM | POA: Diagnosis not present

## 2018-03-06 MED ORDER — LOSARTAN POTASSIUM 25 MG PO TABS: 25.0000 mg | ORAL_TABLET | Freq: Every day | ORAL | 3 refills | Status: DC

## 2018-03-06 MED ORDER — ATORVASTATIN CALCIUM 80 MG PO TABS
80.0000 mg | ORAL_TABLET | Freq: Every day | ORAL | 3 refills | Status: DC
Start: 2018-03-06 — End: 2019-03-29

## 2018-03-06 MED ORDER — NITROGLYCERIN 0.4 MG SL SUBL: 0.4000 mg | SUBLINGUAL_TABLET | SUBLINGUAL | 5 refills | Status: DC | PRN

## 2018-03-06 MED ORDER — CARVEDILOL 6.25 MG PO TABS: 6.2500 mg | ORAL_TABLET | Freq: Two times a day (BID) | ORAL | 3 refills | Status: DC

## 2018-03-06 NOTE — Telephone Encounter (Signed)
° °  New message   Add on for today w/ [facility].

## 2018-03-06 NOTE — Progress Notes (Signed)
Cardiology Office Note:    Date:  03/06/2018   ID:  James Mullins, DOB 13-Oct-1960, MRN [id number]  PCP:  Maurice Small, MD  Cardiologist:  Peter Martinique, MD   Referring MD: Maurice Small, MD   Chief Complaint  Patient presents with  . Follow-up    medication refills    History of Present Illness:    James Mullins is a 57 y.o. male with a hx of inferior STEMI status post DES to the mid RCA 2017, 3 of upper GI bleed by EGD 09/2015, ischemic cardiomyopathy and chronic systolic heart failure last EF 40 to 45%, hypertension, hyperlipidemia, obesity and OSA on CPAP.  He was last seen in clinic on 09/09/2016 with Dr. Martinique.  Since his GI bleed, he has been maintained on Plavix monotherapy.  He is status post Roux en Y procedure.  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  Has been following with oncology.  PET scan showed no metastatic spread.  He has done well from a cardiac standpoint.   He has not been seen by cardiology since 2017.  He presents today for medication management.  Med list includes 81 mg aspirin, 80 mg atorvastatin, 6.25 mg Coreg twice daily, losartan 25 mg daily.  He denies chest pain, shortness of breath, dyspnea on exertion, orthopnea, lower extremity swelling, and syncope.  He is doing quite well from a cardiac standpoint and has no major complaints today.  Past Medical History:  Diagnosis Date  . CAD (coronary artery disease)    a. 09/2015 Inf STEMI/PCI: LM nl, LAD nl, RI nl, LCX nl, RCA 27m (3.5 x 16 Promus Premier MR DES).  . Chronic combined systolic and diastolic CHF (congestive heart failure) ([place]) 09/2015   a. 09/2009 EF 35-45% by LV gram @ time of MI;  b. 09/2015 Echo: EF 40-45%, Gr2 DD, mild MR, mod-sev dil LA, triv TR, PASP 22mmHg.  Marland Kitchen Hyperlipidemia   . Iron deficiency anemia 09/2015  . Ischemic cardiomyopathy    a. 09/2015 Echo: EF 40-45%  . MI (myocardial infarction) ([place]) 2//112017  . Obesity (BMI 30.0-34.9)  2015  . OSA on CPAP 08/2013   snoring. sleep study confirmed.  Dr Asencion Partridge Dohmeier, pt does not knoe cpap settings  . Upper GI bleed 10/29/2015   a. 09/2015 EGD: multiple subepithelial gastric nodules with overlying multiple, oozing ulcers.  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 Now followed @ [facility] wth plan for repeat EGD/colonoscopy.    Past Surgical History:  Procedure Laterality Date  . CARDIAC CATHETERIZATION N/A 09/12/2015   Left Heart Cath and Coronary Angiography; Dr Peter M Martinique, MD;   . CARDIAC CATHETERIZATION N/A 09/12/2015   placement Promus stent to mid RCA, Dr Peter M Martinique, MD  . ESOPHAGOGASTRODUODENOSCOPY N/A 10/29/2015   Procedure: ESOPHAGOGASTRODUODENOSCOPY (EGD);  Surgeon: Mauri Pole, MD;  Location: [facility];  Service: Endoscopy;  Laterality: N/A;  . EUS N/A 11/05/2015   Procedure: FULL UPPER ENDOSCOPIC ULTRASOUND (EUS) RADIAL;  Surgeon: Milus Banister, MD;  Location: [facility];  Service: Endoscopy;  Laterality: N/A;    Current Medications: Current Meds  Medication Sig  . aspirin EC 81 MG tablet Take 81 mg by mouth daily.  . carvedilol (COREG) 6.25 MG tablet Take 1 tablet (6.25 mg total) by mouth 2 (two) times daily with a meal.  . losartan (COZAAR) 25 MG tablet Take 1 tablet (25 mg total) by mouth daily.  . Multiple Vitamins-Minerals (MULTIVITAMIN WITH MINERALS) tablet Take 1 tablet by  mouth daily.  . nitroGLYCERIN (NITROSTAT) 0.4 MG SL tablet Place 1 tablet (0.4 mg total) under the tongue every 5 (five) minutes as needed for chest pain (CP or SOB).  . [DISCONTINUED] atorvastatin (LIPITOR) 80 MG tablet Take 1 tablet (80 mg total) by mouth daily at 6 PM. Needs to schedule office visit for more refills, thanks! Final attempt  . [DISCONTINUED] carvedilol (COREG) 6.25 MG tablet TAKE 1 TABLET BY MOUTH TWICE A DAY WITH MEALS  . [DISCONTINUED] losartan (COZAAR) 25 MG tablet TAKE ONE TABLET BY MOUTH DAILY  . [DISCONTINUED] nitroGLYCERIN (NITROSTAT) 0.4 MG SL tablet Place 1  tablet (0.4 mg total) under the tongue every 5 (five) minutes as needed for chest pain (CP or SOB).     Allergies:   Patient has no known allergies.   Social History   Socioeconomic History  . Marital status: Married    Spouse name: renee  . Number of children: 1  . Years of education: college  . Highest education level: Not on file  Occupational History  . 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   Social Needs  . Financial resource strain: Not on file  . Food insecurity:    Worry: Not on file    Inability: Not on file  . Transportation needs:    Medical: Not on file    Non-medical: Not on file  Tobacco Use  . Smoking status: Never Smoker  . Smokeless tobacco: Current User    Types: Chew  Substance and Sexual Activity  . Alcohol use: Yes    Comment: couple of 6 packs on the weekends  . Drug use: No  . Sexual activity: Not on file  Lifestyle  . Physical activity:    Days per week: Not on file    Minutes per session: Not on file  . Stress: Not on file  Relationships  . Social connections:    Talks on phone: Not on file    Gets together: Not on file    Attends religious service: Not on file    Active member of club or organization: Not on file    Attends meetings of clubs or organizations: Not on file    Relationship status: Not on file  Other Topics Concern  . Not on file  Social History Narrative  . Not on file     Family History: The patient's family history includes Colon cancer in his mother; Stroke in his father.  ROS:   Please see the history of present illness.    All other systems reviewed and are negative.  EKGs/Labs/Other Studies Reviewed:    The following studies were reviewed today:  Echo 09/14/15: Study Conclusions - Left ventricle: The cavity size was mildly dilated. Systolic   function was mildly to moderately reduced. The estimated ejection   fraction was in the range of 40% to  45%. There is akinesis of the   basalinferior myocardium. Features are consistent with a   pseudonormal left ventricular filling pattern, with concomitant   abnormal relaxation and increased filling pressure (grade 2   diastolic dysfunction). - Mitral valve: There was mild regurgitation. - Left atrium: The atrium was moderately to severely dilated. - Tricuspid valve: There was trivial regurgitation. - Pulmonary arteries: PA peak pressure: 31 mm Hg (S).   Left heart cath 09/12/15: Study Conclusions - Left ventricle: The cavity size was mildly dilated. Systolic   function was mildly to moderately  reduced. The estimated ejection   fraction was in the range of 40% to 45%. There is akinesis of the   basalinferior myocardium. Features are consistent with a   pseudonormal left ventricular filling pattern, with concomitant   abnormal relaxation and increased filling pressure (grade 2   diastolic dysfunction). - Mitral valve: There was mild regurgitation. - Left atrium: The atrium was moderately to severely dilated. - Tricuspid valve: There was trivial regurgitation. - Pulmonary arteries: PA peak pressure: 31 mm Hg (S).   EKG:  EKG is ordered today.  The ekg ordered today demonstrates sinus rhythm  Recent Labs: No results found for requested labs within last 8760 hours.  Recent Lipid Panel    Component Value Date/Time   CHOL 178 11/20/2015 1001   TRIG 211 (H) 11/20/2015 1001   HDL 47 11/20/2015 1001   CHOLHDL 3.8 11/20/2015 1001   VLDL 42 (H) 11/20/2015 1001   LDLCALC 89 11/20/2015 1001    Physical Exam:    VS:  BP 117/70   Pulse (!) 58   Ht 5\' 10"  (1.778 m)   Wt 185 lb 12.8 oz (84.3 kg)   SpO2 98%   BMI 26.66 kg/m     Wt Readings from Last 3 Encounters:  03/06/18 185 lb 12.8 oz (84.3 kg)  09/09/16 233 lb 9.6 oz (106 kg)  06/10/16 247 lb 12.8 oz (112.4 kg)     GEN: Well nourished, well developed in no acute distress HEENT: Normal NECK: No JVD; No carotid  bruits CARDIAC: RRR, no murmurs, rubs, gallops RESPIRATORY:  Clear to auscultation without rales, wheezing or rhonchi  ABDOMEN: Soft, non-tender, non-distended MUSCULOSKELETAL:  No edema; No deformity  SKIN: Warm and dry NEUROLOGIC:  Alert and oriented x 3 PSYCHIATRIC:  Normal affect   ASSESSMENT:    1. Coronary artery disease involving native coronary artery of native heart without angina pectoris   2. Chronic combined systolic and diastolic CHF (congestive heart failure) ([place])   3. Hyperlipidemia LDL goal <70   4. Medication management    PLAN:    In order of problems listed above:  Coronary artery disease involving native coronary artery of native heart without angina pectoris  status post inferior STEMI status post DES to RCA 09/2015 He denies chest pain or other anginal symptoms. No concern for ongoing ACS.   Chronic combined systolic and diastolic CHF (congestive heart failure) ([place])  He is euvolemic on exam. No complaints of SOB, DOE, orthopnea, or lower extremity swelling.  Hyperlipidemia LDL goal <70  He will return for fasting lipids and LFTs. Continue statin.   Medication management  Refilled medications as requested.   Follow up in 1 year.   Medication Adjustments/Labs and Tests Ordered: Current medicines are reviewed at length with the patient today.  Concerns regarding medicines are outlined above.  Orders Placed This Encounter  Procedures  . Lipid panel  . Hepatic function panel  . EKG 12-Lead   Meds ordered this encounter  Medications  . atorvastatin (LIPITOR) 80 MG tablet    Sig: Take 1 tablet (80 mg total) by mouth daily at 6 PM.    Dispense:  90 tablet    Refill:  3  . carvedilol (COREG) 6.25 MG tablet    Sig: Take 1 tablet (6.25 mg total) by mouth 2 (two) times daily with a meal.    Dispense:  180 tablet    Refill:  3  . losartan (COZAAR) 25 MG tablet    Sig: Take 1  tablet (25 mg total) by mouth daily.    Dispense:  90 tablet    Refill:  3   . nitroGLYCERIN (NITROSTAT) 0.4 MG SL tablet    Sig: Place 1 tablet (0.4 mg total) under the tongue every 5 (five) minutes as needed for chest pain (CP or SOB).    Dispense:  25 tablet    Refill:  5    Signed, Ledora Bottcher, [place]  03/06/2018 4:52 PM    [facility]

## 2018-03-06 NOTE — Patient Instructions (Addendum)
NO MEDICATION  CHANGES    LABS - ON DAY WITH NO FOOD OR DRINK FASTING LIPID HEPATIC PANEL    Your physician wants you to follow-up in 12 MONTHS WITH DR Martinique. You will receive a reminder letter in the mail two months in advance. If you don't receive a letter, please call our office to schedule the follow-up appointment.

## 2018-03-15 ENCOUNTER — Telehealth: Payer: Self-pay

## 2018-03-15 LAB — HEPATIC FUNCTION PANEL
ALBUMIN: 4.5 g/dL (ref 3.5–5.5)
ALT: 17 IU/L (ref 0–44)
AST: 21 IU/L (ref 0–40)
Alkaline Phosphatase: 63 IU/L (ref 39–117)
BILIRUBIN TOTAL: 0.5 mg/dL (ref 0.0–1.2)
Bilirubin, Direct: 0.17 mg/dL (ref 0.00–0.40)
TOTAL PROTEIN: 7 g/dL (ref 6.0–8.5)

## 2018-03-15 LAB — LIPID PANEL
CHOLESTEROL TOTAL: 139 mg/dL (ref 100–199)
Chol/HDL Ratio: 1.9 ratio (ref 0.0–5.0)
HDL: 75 mg/dL (ref >39)
LDL Calculated: 46 mg/dL (ref 0–99)
Triglycerides: 91 mg/dL (ref 0–149)
VLDL CHOLESTEROL CAL: 18 mg/dL (ref 5–40)

## 2018-03-15 NOTE — Telephone Encounter (Signed)
When I saw the patient on 03/06/18, I did not suspect an ongoing cardiac process. Please check in with your PCP to evaluate for possible anemia or other causes.

## 2018-03-15 NOTE — Telephone Encounter (Signed)
Spoke with patients wife and she stated patient has a lack of energy and she wanted to know if this is cardiac and if not that's fine but she just wanted to ask. Advised her that I would send Angie a message and get back with her.

## 2018-03-15 NOTE — Telephone Encounter (Signed)
Spoke with pt's wife. Pt wife aware of Angie's recommendation verbalized understanding and voiced appreciation for the call. Ledora Bottcher, PA 8 minutes ago (5:07 PM)      When I saw the patient on 03/06/18, I did not suspect an ongoing cardiac process. Please check in with your PCP to evaluate for possible anemia or other causes.       Documentation

## 2018-06-01 ENCOUNTER — Other Ambulatory Visit: Payer: Self-pay | Admitting: Cardiology

## 2018-10-08 ENCOUNTER — Emergency Department (HOSPITAL_COMMUNITY)
Admission: EM | Admit: 2018-10-08 | Discharge: 2018-10-08 | Disposition: A | Discharge status: Home / Self Care | Payer: Managed Care, Other (non HMO) | Attending: Emergency Medicine | Admitting: Emergency Medicine

## 2018-10-08 ENCOUNTER — Emergency Department (HOSPITAL_COMMUNITY): Payer: Managed Care, Other (non HMO)

## 2018-10-08 ENCOUNTER — Other Ambulatory Visit: Payer: Self-pay

## 2018-10-08 ENCOUNTER — Encounter (HOSPITAL_COMMUNITY): Payer: Self-pay

## 2018-10-08 DIAGNOSIS — Y999 Unspecified external cause status: Secondary | ICD-10-CM | POA: Insufficient documentation

## 2018-10-08 DIAGNOSIS — Z5321 Procedure and treatment not carried out due to patient leaving prior to being seen by health care provider: Secondary | ICD-10-CM | POA: Diagnosis not present

## 2018-10-08 DIAGNOSIS — Y93K1 Activity, walking an animal: Secondary | ICD-10-CM | POA: Diagnosis not present

## 2018-10-08 DIAGNOSIS — W19XXXA Unspecified fall, initial encounter: Secondary | ICD-10-CM | POA: Diagnosis not present

## 2018-10-08 DIAGNOSIS — S01511A Laceration without foreign body of lip, initial encounter: Secondary | ICD-10-CM | POA: Insufficient documentation

## 2018-10-08 DIAGNOSIS — Y929 Unspecified place or not applicable: Secondary | ICD-10-CM | POA: Insufficient documentation

## 2018-10-08 NOTE — ED Notes (Signed)
Pt called x2 for CT. No response.

## 2018-10-08 NOTE — ED Notes (Signed)
Called patient twice from lobby with no response, made triage RN Sarah aware, will call CT if patient shows back up for head CT.

## 2018-10-08 NOTE — ED Triage Notes (Signed)
Pt coming from home c/o laceration to inner upper right lip after falling while walking the dog. Abrasion noted to right cheek and knee. No LOC but is on a blood thinner. Pt had "like 3 beers" Hx of cancer and MI.

## 2019-03-22 ENCOUNTER — Other Ambulatory Visit: Payer: Self-pay

## 2019-03-22 MED ORDER — LOSARTAN POTASSIUM 25 MG PO TABS: 25.0000 mg | ORAL_TABLET | Freq: Every day | ORAL | 3 refills | Status: DC

## 2019-03-29 ENCOUNTER — Other Ambulatory Visit: Payer: Self-pay

## 2019-03-29 MED ORDER — CARVEDILOL 6.25 MG PO TABS: 6.2500 mg | ORAL_TABLET | Freq: Two times a day (BID) | ORAL | 1 refills | Status: DC

## 2019-03-29 MED ORDER — LOSARTAN POTASSIUM 25 MG PO TABS: 25.0000 mg | ORAL_TABLET | Freq: Every day | ORAL | 3 refills | Status: DC

## 2019-03-29 MED ORDER — ATORVASTATIN CALCIUM 80 MG PO TABS: 80.0000 mg | ORAL_TABLET | Freq: Every day | ORAL | 3 refills | Status: DC

## 2019-04-01 ENCOUNTER — Other Ambulatory Visit: Payer: Self-pay

## 2019-04-01 MED ORDER — LOSARTAN POTASSIUM 25 MG PO TABS: 25.0000 mg | ORAL_TABLET | Freq: Every day | ORAL | 0 refills | Status: DC

## 2019-04-04 ENCOUNTER — Other Ambulatory Visit: Payer: Self-pay

## 2019-04-04 MED ORDER — ATORVASTATIN CALCIUM 80 MG PO TABS: 80.0000 mg | ORAL_TABLET | Freq: Every day | ORAL | 0 refills | Status: DC

## 2019-08-29 ENCOUNTER — Other Ambulatory Visit: Payer: Self-pay

## 2019-08-29 ENCOUNTER — Encounter: Payer: Self-pay | Admitting: Physician Assistant

## 2019-08-29 ENCOUNTER — Ambulatory Visit: Payer: Managed Care, Other (non HMO) | Admitting: Physician Assistant

## 2019-08-29 VITALS — BP 168/93 | HR 69 | Temp 100.3°F | Ht 69.0 in | Wt 187.2 lb

## 2019-08-29 DIAGNOSIS — E785 Hyperlipidemia, unspecified: Secondary | ICD-10-CM

## 2019-08-29 DIAGNOSIS — I10 Essential (primary) hypertension: Secondary | ICD-10-CM

## 2019-08-29 DIAGNOSIS — I2510 Atherosclerotic heart disease of native coronary artery without angina pectoris: Secondary | ICD-10-CM | POA: Diagnosis not present

## 2019-08-29 DIAGNOSIS — I5042 Chronic combined systolic (congestive) and diastolic (congestive) heart failure: Secondary | ICD-10-CM

## 2019-08-29 DIAGNOSIS — Z9989 Dependence on other enabling machines and devices: Secondary | ICD-10-CM

## 2019-08-29 DIAGNOSIS — G4733 Obstructive sleep apnea (adult) (pediatric): Secondary | ICD-10-CM

## 2019-08-29 NOTE — Progress Notes (Signed)
   Primary Cardiologist: Peter Martinique, MD  Mr. James Mullins (DOB 12/19/1960) was seen in cardiology office on 08/29/2019 for annual visit and cardiac DOT clearance.  He denies any recent exertional chest pain or shortness of breath.  EKG obtained today did not show any obvious ischemic changes.  He is doing quite well after his inferior MI in February 2017.  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  Please call with questions.  Almyra Deforest, [facility] [address], [place], [place] [address] [phone] 08/29/2019, 11:57 AM

## 2019-08-29 NOTE — Progress Notes (Signed)
Cardiology Office Note  Date: 08/31/2019   ID: James Mullins, DOB [birth year], MRN [id number]  PCP:  Maurice Small, MD  Cardiologist:  Peter Martinique, MD Electrophysiologist:  None  Oncologist: Dr. Leslie Andrea of [facility]  Chief Complaint  Patient presents with  . Follow-up    seen for Dr. Martinique.    History of Present Illness: James Mullins is a 59 y.o. male last encounter with Fabian Sharp PA March 06, 2018.  Past medical history of inferior STEMI status post DES to mid RCA in 09/2015, history of 3 upper GI bleeds in 2017, ischemic cardiomyopathy, chronic systolic heart failure, HTN, HLD and OSA on CPAP.  Last ejection fraction 40 to 45% based on echocardiogram on 09/14/2015. 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  He is being seen by oncology.  Metastatic spread was noted.  Patient presents today for cardiology office visit.  He was last seen by his oncologist in June 2020.  He denies any chest pain or shortness of breath.  He continues to work as a Administrator, driving a Health visitor.  He only drives locally.  His blood pressure is elevated today, however normally it is quite well controlled.  He has no lower extremity edema, orthopnea or PND.  He can follow-up in 1 year.  He does require a DOT clearance.  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  I have deferred to his DOT physician to decide if a repeat stress test is required based on his local DOT regulations.  If a stress test is required, we can order a [employer].  Note, although patient had low-grade fever when measured by our CMA at 100.3.  I checked his temporal pressure twice, both times came back as 98.7.  He denies any fever, chill, cough or recent exposure to Covid.  Cardiac medications reviewed today medications include aspirin 81, daily, carvedilol 6.25 p.o. daily, 25, as needed nitroglycerin sublingual  0.4 mg  Past Medical History:  Diagnosis Date  . CAD (coronary artery disease)    a. 09/2015 Inf STEMI/PCI: LM nl, LAD nl, RI nl, LCX nl, RCA 65m (3.5 x 16 Promus Premier MR DES).  . Chronic combined systolic and diastolic CHF (congestive heart failure) ([place]) 09/2015   a. 09/2009 EF 35-45% by LV gram @ time of MI;  b. 09/2015 Echo: EF 40-45%, Gr2 DD, mild MR, mod-sev dil LA, triv TR, PASP 89mmHg.  Marland Kitchen Hyperlipidemia   . Iron deficiency anemia 09/2015  . Ischemic cardiomyopathy    a. 09/2015 Echo: EF 40-45%  . MI (myocardial infarction) ([place]) 2//112017  . Obesity (BMI 30.0-34.9) 2015  . OSA on CPAP 08/2013   snoring. sleep study confirmed.  Dr Asencion Partridge Dohmeier, pt does not knoe cpap settings  . Upper GI bleed 10/29/2015   a. 09/2015 EGD: multiple subepithelial gastric nodules with overlying multiple, oozing ulcers.  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 Now followed @ [facility] wth plan for repeat EGD/colonoscopy.    Past Surgical History:  Procedure Laterality Date  . CARDIAC CATHETERIZATION N/A 09/12/2015   Left Heart Cath and Coronary Angiography; Dr Peter M Martinique, MD;   . CARDIAC CATHETERIZATION N/A 09/12/2015   placement Promus stent to mid RCA, Dr Peter M Martinique, MD  . ESOPHAGOGASTRODUODENOSCOPY N/A 10/29/2015   Procedure: ESOPHAGOGASTRODUODENOSCOPY (EGD);  Surgeon: Mauri Pole, MD;  Location: [facility];  Service: Endoscopy;  Laterality: N/A;  . EUS N/A  11/05/2015   Procedure: FULL UPPER ENDOSCOPIC ULTRASOUND (EUS) RADIAL;  Surgeon: Milus Banister, MD;  Location: [facility];  Service: Endoscopy;  Laterality: N/A;    Current Outpatient Medications  Medication Sig Dispense Refill  . aspirin EC 81 MG tablet Take 81 mg by mouth daily.    Marland Kitchen atorvastatin (LIPITOR) 80 MG tablet Take 1 tablet (80 mg total) by mouth daily at 6 PM. 30 tablet 0  . carvedilol (COREG) 6.25 MG tablet Take 1 tablet (6.25 mg total) by mouth 2 (two) times daily with a meal. 180 tablet 1  . losartan (COZAAR) 25 MG tablet  Take 1 tablet (25 mg total) by mouth daily. 30 tablet 0  . Multiple Vitamins-Minerals (MULTIVITAMIN WITH MINERALS) tablet Take 1 tablet by mouth daily.    . nitroGLYCERIN (NITROSTAT) 0.4 MG SL tablet Place 1 tablet (0.4 mg total) under the tongue every 5 (five) minutes as needed for chest pain (CP or SOB). 25 tablet 5   No current facility-administered medications for this visit.   Allergies:  Patient has no known allergies.   Social History: The patient  reports that he has never smoked. His smokeless tobacco use includes chew. He reports current alcohol use. He reports that he does not use drugs.   Family History: The patient's family history includes Colon cancer in his mother; Stroke in his father.   ROS:  Please see the history of present illness. Otherwise, complete review of systems is positive for .  All other systems are reviewed and negative.   Physical Exam: VS:  BP (!) 168/93   Pulse 69   Temp 100.3 F (37.9 C)   Ht 5\' 9"  (1.753 m)   Wt 187 lb 3.2 oz (84.9 kg)   SpO2 99%   BMI 27.64 kg/m , BMI Body mass index is 27.64 kg/m.  Wt Readings from Last 3 Encounters:  08/29/19 187 lb 3.2 oz (84.9 kg)  03/06/18 185 lb 12.8 oz (84.3 kg)  09/09/16 233 lb 9.6 oz (106 kg)    General: Patient appears comfortable at rest. HEENT: Conjunctiva and lids normal, oropharynx clear with moist mucosa. Neck: Supple, no elevated JVP or carotid bruits, no thyromegaly. Lungs: Clear to auscultation, nonlabored breathing at rest. Cardiac: Regular rate and rhythm, no S3 or significant systolic murmur, no pericardial rub. Abdomen: Soft, nontender, no hepatomegaly, bowel sounds present, no guarding or rebound. Extremities: No pitting edema, distal pulses 2+. Skin: Warm and dry. Musculoskeletal: No kyphosis. Neuropsychiatric: Alert and oriented x3, affect grossly appropriate.  ECG:  An ECG dated 08/29/2019 was personally reviewed today and demonstrated:  Normal sinus rhythm without significant  ST-T wave changes  Recent Labwork: No results found for requested labs within last 8760 hours.     Component Value Date/Time   CHOL 139 03/14/2018 1222   TRIG 91 03/14/2018 1222   HDL 75 03/14/2018 1222   CHOLHDL 1.9 03/14/2018 1222   CHOLHDL 3.8 11/20/2015 1001   VLDL 42 (H) 11/20/2015 1001   LDLCALC 46 03/14/2018 1222    Other Studies Reviewed Today:  Echo 09/14/15: Study Conclusions - Left ventricle: The cavity size was mildly dilated. Systolic function was mildly to moderately reduced. The estimated ejection fraction was in the range of 40% to 45%. There is akinesis of the basalinferior myocardium. Features are consistent with a pseudonormal left ventricular filling pattern, with concomitant abnormal relaxation and increased filling pressure (grade 2 diastolic dysfunction). - Mitral valve: There was mild regurgitation. - Left atrium: The atrium was  moderately to severely dilated. - Tricuspid valve: There was trivial regurgitation. - Pulmonary arteries: PA peak pressure: 31 mm Hg (S).   Left heart cath 09/12/15: Study Conclusions - Left ventricle: The cavity size was mildly dilated. Systolic function was mildly to moderately reduced. The estimated ejection fraction was in the range of 40% to 45%. There is akinesis of the basalinferior myocardium. Features are consistent with a pseudonormal left ventricular filling pattern, with concomitant abnormal relaxation and increased filling pressure (grade 2 diastolic dysfunction). - Mitral valve: There was mild regurgitation. - Left atrium: The atrium was moderately to severely dilated. - Tricuspid valve: There was trivial regurgitation. - Pulmonary arteries: PA peak pressure: 31 mm Hg (S).   Assessment and Plan:  1. Coronary artery disease involving native coronary artery of native heart without angina pectoris   2. Chronic combined systolic and diastolic CHF (congestive heart failure) ([place])     3. Hyperlipidemia LDL goal <70   4. Essential hypertension   5. OSA on CPAP    1.  CAD: Denies any recent chest pain.  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  If he does require a stress test, we would be happy to order the stress test.  Last PCI was in 2017  2.  Chronic combined systolic and diastolic heart failure/ischemic cardiomyopathy: EF 45%.  Euvolemic on physical exam  3. Hypertension: Blood pressure stable  4. Hyperlipidemia: On Lipitor 80 mg daily.  Obtain CMP and fasting lipid panel in 1 month  5. Obstructive sleep apnea: On CPAP  Medication Adjustments/Labs and Tests Ordered: Current medicines are reviewed at length with the patient today.  Concerns regarding medicines are outlined above.    Patient Instructions  Medication Instructions:  Your physician recommends that you continue on your current medications as directed. Please refer to the Current Medication list given to you today.  *If you need a refill on your cardiac medications before your next appointment, please call your pharmacy*  Lab Work: Your physician recommends that you return for lab work in 1 MONTH:   [place] MIDNIGHT ([place])  If you have labs (blood work) drawn today and your tests are completely normal, you will receive your results only by: Marland Kitchen MyChart Message (if you have MyChart) OR . A paper copy in the mail If you have any lab test that is abnormal or we need to change your treatment, we will call you to review the results.  Testing/Procedures: NONE ordered at this time of appointment   Follow-Up: At [facility], you and your health needs are our priority.  As part of our continuing mission to provide you with exceptional heart care, we have created designated Provider Care Teams.  These Care Teams include your primary Cardiologist (physician) and Advanced  Practice Providers (APPs -  Physician Assistants and Nurse Practitioners) who all work together to provide you with the care you need, when you need it.  Your next appointment:   12 month(s)  The format for your next appointment:   In Person  Provider:   Peter Martinique, MD  Other Instructions  Monitor blood pressure. If your Systolic (top) number is constantly over 130 give our office a call           Signed, Levell July, NP 08/31/2019 11:13 PM    [facility]  [address] [place], [place] [address] Phone: [phone]; Fax: [phone]

## 2019-08-29 NOTE — Patient Instructions (Addendum)
Medication Instructions:  Your physician recommends that you continue on your current medications as directed. Please refer to the Current Medication list given to you today.  *If you need a refill on your cardiac medications before your next appointment, please call your pharmacy*  Lab Work: Your physician recommends that you return for lab work in 1 MONTH:   [place] MIDNIGHT ([place])  If you have labs (blood work) drawn today and your tests are completely normal, you will receive your results only by: Marland Kitchen MyChart Message (if you have MyChart) OR . A paper copy in the mail If you have any lab test that is abnormal or we need to change your treatment, we will call you to review the results.  Testing/Procedures: NONE ordered at this time of appointment   Follow-Up: At [facility], you and your health needs are our priority.  As part of our continuing mission to provide you with exceptional heart care, we have created designated Provider Care Teams.  These Care Teams include your primary Cardiologist (physician) and Advanced Practice Providers (APPs -  Physician Assistants and Nurse Practitioners) who all work together to provide you with the care you need, when you need it.  Your next appointment:   12 month(s)  The format for your next appointment:   In Person  Provider:   Peter Martinique, MD  Other Instructions  Monitor blood pressure. If your Systolic (top) number is constantly over [phone] give our office a call

## 2019-08-31 ENCOUNTER — Encounter: Payer: Self-pay | Admitting: Physician Assistant

## 2019-09-04 ENCOUNTER — Emergency Department (HOSPITAL_COMMUNITY): Payer: Managed Care, Other (non HMO)

## 2019-09-04 ENCOUNTER — Emergency Department (HOSPITAL_COMMUNITY)
Admission: EM | Admit: 2019-09-04 | Discharge: 2019-09-04 | Disposition: A | Discharge status: Home / Self Care | Payer: Managed Care, Other (non HMO) | Attending: Emergency Medicine | Admitting: Emergency Medicine

## 2019-09-04 ENCOUNTER — Other Ambulatory Visit: Payer: Self-pay

## 2019-09-04 ENCOUNTER — Encounter (HOSPITAL_COMMUNITY): Payer: Self-pay | Admitting: Emergency Medicine

## 2019-09-04 DIAGNOSIS — Z7982 Long term (current) use of aspirin: Secondary | ICD-10-CM | POA: Insufficient documentation

## 2019-09-04 DIAGNOSIS — Z79899 Other long term (current) drug therapy: Secondary | ICD-10-CM | POA: Diagnosis not present

## 2019-09-04 DIAGNOSIS — I2510 Atherosclerotic heart disease of native coronary artery without angina pectoris: Secondary | ICD-10-CM | POA: Insufficient documentation

## 2019-09-04 DIAGNOSIS — R200 Anesthesia of skin: Secondary | ICD-10-CM | POA: Diagnosis not present

## 2019-09-04 DIAGNOSIS — I5042 Chronic combined systolic (congestive) and diastolic (congestive) heart failure: Secondary | ICD-10-CM | POA: Insufficient documentation

## 2019-09-04 DIAGNOSIS — F17220 Nicotine dependence, chewing tobacco, uncomplicated: Secondary | ICD-10-CM | POA: Diagnosis not present

## 2019-09-04 DIAGNOSIS — Y908 Blood alcohol level of 240 mg/100 ml or more: Secondary | ICD-10-CM | POA: Diagnosis not present

## 2019-09-04 DIAGNOSIS — R260 Ataxic gait: Secondary | ICD-10-CM | POA: Diagnosis present

## 2019-09-04 DIAGNOSIS — F10920 Alcohol use, unspecified with intoxication, uncomplicated: Secondary | ICD-10-CM | POA: Insufficient documentation

## 2019-09-04 LAB — DIFFERENTIAL
Abs Immature Granulocytes: 0.05 10*3/uL (ref 0.00–0.07)
Basophils Absolute: 0 10*3/uL (ref 0.0–0.1)
Basophils Relative: 1 %
Eosinophils Absolute: 0.2 10*3/uL (ref 0.0–0.5)
Eosinophils Relative: 3 %
Immature Granulocytes: 1 %
Lymphocytes Relative: 47 %
Lymphs Abs: 2.9 10*3/uL (ref 0.7–4.0)
Monocytes Absolute: 0.5 10*3/uL (ref 0.1–1.0)
Monocytes Relative: 8 %
Neutro Abs: 2.4 10*3/uL (ref 1.7–7.7)
Neutrophils Relative %: 40 %

## 2019-09-04 LAB — COMPREHENSIVE METABOLIC PANEL
ALT: 22 U/L (ref 0–44)
AST: 41 U/L (ref 15–41)
Albumin: 3.7 g/dL (ref 3.5–5.0)
Alkaline Phosphatase: 43 U/L (ref 38–126)
Anion gap: 12 (ref 5–15)
BUN: 5 mg/dL — ABNORMAL LOW (ref 6–20)
CO2: 20 mmol/L — ABNORMAL LOW (ref 22–32)
Calcium: 8.2 mg/dL — ABNORMAL LOW (ref 8.9–10.3)
Chloride: 100 mmol/L (ref 98–111)
Creatinine, Ser: 0.64 mg/dL (ref 0.61–1.24)
GFR calc Af Amer: >60 mL/min (ref >60)
GFR calc non Af Amer: >60 mL/min (ref >60)
Glucose, Bld: 77 mg/dL (ref 70–99)
Potassium: 4.6 mmol/L (ref 3.5–5.1)
Sodium: 132 mmol/L — ABNORMAL LOW (ref 135–145)
Total Bilirubin: 0.7 mg/dL (ref 0.3–1.2)
Total Protein: 5.8 g/dL — ABNORMAL LOW (ref 6.5–8.1)

## 2019-09-04 LAB — URINALYSIS, ROUTINE W REFLEX MICROSCOPIC
Bilirubin Urine: NEGATIVE
Glucose, UA: NEGATIVE mg/dL
Hgb urine dipstick: NEGATIVE
Ketones, ur: NEGATIVE mg/dL
Leukocytes,Ua: NEGATIVE
Nitrite: NEGATIVE
Protein, ur: NEGATIVE mg/dL
Specific Gravity, Urine: 1.002 — ABNORMAL LOW (ref 1.005–1.030)
pH: 6 (ref 5.0–8.0)

## 2019-09-04 LAB — RAPID URINE DRUG SCREEN, HOSP PERFORMED
Amphetamines: NOT DETECTED
Barbiturates: NOT DETECTED
Benzodiazepines: NOT DETECTED
Cocaine: NOT DETECTED
Opiates: NOT DETECTED
Tetrahydrocannabinol: NOT DETECTED

## 2019-09-04 LAB — CBC
HCT: 39.5 % (ref 39.0–52.0)
Hemoglobin: 14 g/dL (ref 13.0–17.0)
MCH: 34.9 pg — ABNORMAL HIGH (ref 26.0–34.0)
MCHC: 35.4 g/dL (ref 30.0–36.0)
MCV: 98.5 fL (ref 80.0–100.0)
Platelets: 197 10*3/uL (ref 150–400)
RBC: 4.01 MIL/uL — ABNORMAL LOW (ref 4.22–5.81)
RDW: 11.7 % (ref 11.5–15.5)
WBC: 6.1 10*3/uL (ref 4.0–10.5)
nRBC: 0 % (ref 0.0–0.2)

## 2019-09-04 LAB — APTT: aPTT: 32 seconds (ref 24–36)

## 2019-09-04 LAB — PROTIME-INR
INR: 1 (ref 0.8–1.2)
Prothrombin Time: 13 seconds (ref 11.4–15.2)

## 2019-09-04 LAB — ETHANOL: Alcohol, Ethyl (B): 315 mg/dL (ref <10)

## 2019-09-04 NOTE — Discharge Instructions (Addendum)

## 2019-09-04 NOTE — ED Triage Notes (Signed)
Per EMS, Pt's wife found him stumbling around the house "bumping into things."  Went to bed after having 6 beers and at that time she did report slurred speech.  Went to bed at midnight.  Per EMS he can walk in a straight line but when he tries to turn he stumbles and has to be caught.  Did initially have numbness in his top lip however that has resolved.    140/92 50HR (on beta blockers) CBG 98 100% RA RR 16

## 2019-09-04 NOTE — ED Notes (Signed)
Pt was able to ambulate in the hall w/ assistance.  He appeared intoxicated as he tired to walk into the door.  He stumbled slightly but was able to regain his step.  Provider aware.

## 2019-09-04 NOTE — ED Provider Notes (Signed)
[facility] EMERGENCY DEPARTMENT Provider Note   CSN: [id number] Arrival date & time: 09/04/19  0248     History Chief Complaint  Patient presents with  . Gait Problem    James Mullins is a 59 y.o. male.  The history is provided by the patient.  Neurologic Problem This is a new problem. The current episode started 1 to 2 hours ago. The problem occurs constantly. The problem has been gradually improving. Pertinent negatives include no chest pain, no abdominal pain, no headaches and no shortness of breath. Nothing aggravates the symptoms. Nothing relieves the symptoms.  Patient with history of CAD, CHF, hyperlipidemia presents with difficulty walking.  Patient reports while he was at home he was bumping into things feeling that his balance was off.  No falls, no syncope.  No headache or vomiting.  No visual changes.  No chest pain or shortness of breath. He does admit to drinking only 6 beers prior to going to bed.  However he reports that that is not usual for him. Wife reports that he had no focal weakness but was having difficulty walking. No new medications.  His course is improving. He did mention numbness to his lips, that is improving     Past Medical History:  Diagnosis Date  . CAD (coronary artery disease)    a. 09/2015 Inf STEMI/PCI: LM nl, LAD nl, RI nl, LCX nl, RCA 106m (3.5 x 16 Promus Premier MR DES).  . Chronic combined systolic and diastolic CHF (congestive heart failure) ([place]) 09/2015   a. 09/2009 EF 35-45% by LV gram @ time of MI;  b. 09/2015 Echo: EF 40-45%, Gr2 DD, mild MR, mod-sev dil LA, triv TR, PASP 54mmHg.  Marland Kitchen Hyperlipidemia   . Iron deficiency anemia 09/2015  . Ischemic cardiomyopathy    a. 09/2015 Echo: EF 40-45%  . MI (myocardial infarction) ([place]) 2//112017  . Obesity (BMI 30.0-34.9) 2015  . OSA on CPAP 08/2013   snoring. sleep study confirmed.  Dr Asencion Partridge Dohmeier, pt does not knoe cpap settings  . Upper GI bleed 10/29/2015   a. 09/2015  EGD: multiple subepithelial gastric nodules with overlying multiple, oozing ulcers.  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 Now followed @ [facility] wth plan for repeat EGD/colonoscopy.    Patient Active Problem List   Diagnosis Date Noted  . Gastric cancer ([place]) 06/10/2016  . CAD (coronary artery disease), native coronary artery   . Acute gouty arthritis   . Acute upper GI bleed   . Melena   . Acute blood loss anemia 10/30/2015  . Upper GI bleed 10/30/2015  . Gastric mass   . Bleeding gastrointestinal   . GI bleed 10/29/2015  . Cough 10/29/2015  . Chronic combined systolic and diastolic CHF (congestive heart failure) ([place])   . Anemia 09/21/2015  . ST elevation (STEMI) myocardial infarction involving right coronary artery ([place]) 09/12/2015  . OSA (obstructive sleep apnea) 09/12/2015  . Snoring disorder 08/05/2013  . Obesity (BMI 30.0-34.9)     Past Surgical History:  Procedure Laterality Date  . CARDIAC CATHETERIZATION N/A 09/12/2015   Left Heart Cath and Coronary Angiography; Dr Peter M Martinique, MD;   . CARDIAC CATHETERIZATION N/A 09/12/2015   placement Promus stent to mid RCA, Dr Peter M Martinique, MD  . ESOPHAGOGASTRODUODENOSCOPY N/A 10/29/2015   Procedure: ESOPHAGOGASTRODUODENOSCOPY (EGD);  Surgeon: Mauri Pole, MD;  Location: [facility];  Service: Endoscopy;  Laterality: N/A;  . EUS N/A 11/05/2015   Procedure: FULL UPPER ENDOSCOPIC ULTRASOUND (  EUS) RADIAL;  Surgeon: Milus Banister, MD;  Location: [facility] END[facility]COPY;  Service: Endoscopy;  Laterality: N/A;       Family History  Problem Relation Age of Onset  . Colon cancer Mother   . Stroke Father     Social History   Tobacco Use  . Smoking status: Never Smoker  . Smokeless tobacco: Current User    Types: Chew  Substance Use Topics  . Alcohol use: Yes    Comment: couple of 6 packs on the weekends  . Drug use: No    Home Medications Prior to Admission medications   Medication Sig Start Date End Date Taking? Authorizing Provider    aspirin EC 81 MG tablet Take 81 mg by mouth daily.    [provider]  atorvastatin (LIPITOR) 80 MG tablet Take 1 tablet (80 mg total) by mouth daily at 6 PM. 04/04/19   [facility], Tami Lin, PA  carvedilol (COREG) 6.25 MG tablet Take 1 tablet (6.25 mg total) by mouth 2 (two) times daily with a meal. 03/29/19   [facility], Tami Lin, PA  losartan (COZAAR) 25 MG tablet Take 1 tablet (25 mg total) by mouth daily. 04/01/19   [facility], Tami Lin, PA  Multiple Vitamins-Minerals (MULTIVITAMIN WITH MINERALS) tablet Take 1 tablet by mouth daily.    [provider]  nitroGLYCERIN (NITROSTAT) 0.4 MG SL tablet Place 1 tablet (0.4 mg total) under the tongue every 5 (five) minutes as needed for chest pain (CP or SOB). 03/06/18   Ledora Bottcher, PA    Allergies    Patient has no known allergies.  Review of Systems   Review of Systems  Constitutional: Negative for fever and unexpected weight change.  Eyes: Negative for visual disturbance.  Respiratory: Negative for shortness of breath.   Cardiovascular: Negative for chest pain.  Gastrointestinal: Negative for abdominal pain.  Neurological: Positive for numbness. Negative for headaches.  All other systems reviewed and are negative.   Physical Exam Updated Vital Signs BP (!) 150/101   Pulse (!) 58   Temp 97.8 F (36.6 C) (Oral)   Resp 18   Ht 1.778 m (5\' 10" )   Wt 83.9 kg   SpO2 100%   BMI 26.54 kg/m   Physical Exam CONSTITUTIONAL: Well developed/well nourished, patient appears mildly intoxicated HEAD: Normocephalic/atraumatic EYES: EOMI/PERRL, mild horizontal nystagmus, no visual field deficit  no ptosis ENMT: Mucous membranes moist NECK: supple no meningeal signs, no bruits CV: S1/S2 noted, no murmurs/rubs/gallops noted LUNGS: Lungs are clear to auscultation bilaterally, no apparent distress ABDOMEN: soft, nontender, no rebound or guarding GU:no cva tenderness NEURO:Awake/alert, face symmetric, no arm or leg drift is  noted Equal 5/5 strength with shoulder abduction, elbow flex/extension, wrist flex/extension in upper extremities and equal hand grips bilaterally Equal 5/5 strength with hip flexion,knee flex/extension, foot dorsi/plantar flexion Cranial nerves 3/4/5/6/02/06/09/11/12 tested and intact No past pointing Sensation to light touch intact in all extremities EXTREMITIES: pulses normal, full ROM SKIN: warm, color normal PSYCH: no abnormalities of mood noted  ED Results / Procedures / Treatments   Labs (all labs ordered are listed, but only abnormal results are displayed) Labs Reviewed  ETHANOL - Abnormal; Notable for the following components:      Result Value   Alcohol, Ethyl (B) 315 (*)    All other components within normal limits  CBC - Abnormal; Notable for the following components:   RBC 4.01 (*)    MCH 34.9 (*)    All other components within normal  limits  COMPREHENSIVE METABOLIC PANEL - Abnormal; Notable for the following components:   Sodium 132 (*)    CO2 20 (*)    BUN 5 (*)    Calcium 8.2 (*)    Total Protein 5.8 (*)    All other components within normal limits  URINALYSIS, ROUTINE W REFLEX MICROSCOPIC - Abnormal; Notable for the following components:   Color, Urine STRAW (*)    Specific Gravity, Urine 1.002 (*)    All other components within normal limits  PROTIME-INR  APTT  DIFFERENTIAL  RAPID URINE DRUG SCREEN, HOSP PERFORMED    EKG EKG Interpretation  Date/Time:  Wednesday September 04 2019 03:00:09 EST Ventricular Rate:  55 PR Interval:    QRS Duration: 113 QT Interval:  462 QTC Calculation: 442 R Axis:   65 Text Interpretation: Sinus rhythm Borderline intraventricular conduction delay Abnormal R-wave progression, early transition Confirmed by Ripley Fraise [phone]) on 09/04/2019 3:08:32 AM   Radiology CT HEAD WO CONTRAST  Result Date: 09/04/2019 CLINICAL DATA:  Ataxia EXAM: CT HEAD WITHOUT CONTRAST TECHNIQUE: Contiguous axial images were obtained from the  base of the skull through the vertex without intravenous contrast. COMPARISON:  None. FINDINGS: Brain: There is no mass, hemorrhage or extra-axial collection. The size and configuration of the ventricles and extra-axial CSF spaces are normal. There is hypoattenuation of the white matter, most commonly indicating chronic small vessel disease. 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 Vascular: No abnormal hyperdensity of the major intracranial arteries or dural venous sinuses. No intracranial atherosclerosis. Skull: The visualized skull base, calvarium and extracranial soft tissues are normal. Sinuses/Orbits: No fluid levels or advanced mucosal thickening of the visualized paranasal sinuses. No mastoid or middle ear effusion. The orbits are normal. 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 Electronically Signed   By: Ulyses Jarred M.D.   On: 09/04/2019 03:53    Procedures Procedures   Medications Ordered in ED Medications - No data to display  ED Course  I have reviewed the triage vital signs and the nursing notes.  Pertinent labs & imaging results that were available during my care of the patient were reviewed by me and considered in my medical decision making (see chart for details).    MDM Rules/Calculators/A&P                      3:51 AM Patient presents after stumbling in his house.  He reports he was bumping into things. Patient does have a history of CAD, CHF as well as a neuroendocrine tumor that has been under control and managed by [place] [facility]  Labs and CT imaging are pending this time.  Patient does admit to drinking at least 6 beers tonight.  He does appear mildly intoxicated. Work-up is pending at this time Camie Patience [phone] 6:37 AM Alcohol level over 300. CT head negative Alcohol intoxication likely cause of his difficulty walking. No other focal signs of  stroke. Patient is improving, though he is still intoxicated and will need some assistance with walking at home. He gave me permission to talk to his wife via phone. He is approved for discharge home Final Clinical Impression(s) / ED Diagnoses Final diagnoses:  Alcoholic intoxication without complication [facility])    Rx / DC Orders ED Discharge Orders    None       Ripley Fraise, MD 09/04/19 [phone]

## 2019-09-04 NOTE — ED Notes (Signed)
Informed provider of critical ETOH

## 2019-09-30 ENCOUNTER — Other Ambulatory Visit: Payer: Self-pay | Admitting: *Deleted

## 2019-09-30 MED ORDER — CARVEDILOL 6.25 MG PO TABS: 6.2500 mg | ORAL_TABLET | Freq: Two times a day (BID) | ORAL | 3 refills | Status: DC

## 2019-10-01 ENCOUNTER — Other Ambulatory Visit: Payer: Self-pay

## 2019-12-02 ENCOUNTER — Telehealth: Payer: Self-pay | Admitting: Cardiology

## 2019-12-02 DIAGNOSIS — I2510 Atherosclerotic heart disease of native coronary artery without angina pectoris: Secondary | ICD-10-CM

## 2019-12-02 DIAGNOSIS — I2111 ST elevation (STEMI) myocardial infarction involving right coronary artery: Secondary | ICD-10-CM

## 2019-12-02 NOTE — Telephone Encounter (Signed)
Spoke to patient's wife Dr.Jordan's advice given.Advised scheduler will call back with [place] appointment.

## 2019-12-02 NOTE — Telephone Encounter (Signed)
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

## 2019-12-02 NOTE — Telephone Encounter (Signed)
Will need a Lexiscan Myoview for DOT clearance.  Peter Martinique MD, [facility]

## 2019-12-02 NOTE — Telephone Encounter (Signed)
Routed to MD to review and advise on request for DOT stress test

## 2019-12-04 ENCOUNTER — Encounter (HOSPITAL_COMMUNITY): Payer: Self-pay

## 2019-12-12 ENCOUNTER — Telehealth (HOSPITAL_COMMUNITY): Payer: Self-pay

## 2019-12-12 NOTE — Telephone Encounter (Signed)
DPR conflicts with current demographic information.  Encounter complete.

## 2019-12-12 NOTE — Telephone Encounter (Signed)
Encounter complete. 

## 2019-12-17 ENCOUNTER — Other Ambulatory Visit: Payer: Self-pay

## 2019-12-17 ENCOUNTER — Ambulatory Visit (HOSPITAL_COMMUNITY)
Admission: RE | Admit: 2019-12-17 | Discharge: 2019-12-17 | Disposition: A | Discharge status: Home / Self Care | Payer: Managed Care, Other (non HMO) | Source: Ambulatory Visit | Attending: Cardiology | Admitting: Cardiology

## 2019-12-17 DIAGNOSIS — I2510 Atherosclerotic heart disease of native coronary artery without angina pectoris: Secondary | ICD-10-CM | POA: Diagnosis present

## 2019-12-17 DIAGNOSIS — I2111 ST elevation (STEMI) myocardial infarction involving right coronary artery: Secondary | ICD-10-CM | POA: Diagnosis not present

## 2019-12-17 LAB — MYOCARDIAL PERFUSION IMAGING
LV dias vol: 197 mL (ref 62–150)
LV sys vol: 107 mL
Peak HR: 86 {beats}/min
Rest HR: 46 {beats}/min
SDS: 0
SRS: 0
SSS: 0
TID: 1.23

## 2019-12-17 MED ORDER — TECHNETIUM TC 99M TETROFOSMIN IV KIT
31.8000 | PACK | Freq: Once | INTRAVENOUS | Status: AC | PRN
  Administered 2019-12-17: 31.8 via INTRAVENOUS
  Filled 2019-12-17: qty 32

## 2019-12-17 MED ORDER — REGADENOSON 0.4 MG/5ML IV SOLN
0.4000 mg | Freq: Once | INTRAVENOUS | Status: AC
  Administered 2019-12-17: 0.4 mg via INTRAVENOUS

## 2019-12-17 MED ORDER — TECHNETIUM TC 99M TETROFOSMIN IV KIT
10.1000 | PACK | Freq: Once | INTRAVENOUS | Status: AC | PRN
  Administered 2019-12-17: 10.1 via INTRAVENOUS
  Filled 2019-12-17: qty 11

## 2020-01-03 ENCOUNTER — Telehealth: Payer: Self-pay | Admitting: Cardiology

## 2020-01-03 NOTE — Telephone Encounter (Signed)
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 Please call to confirm

## 2020-01-03 NOTE — Telephone Encounter (Signed)
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 Cris Talavera Martinique MD, [facility]

## 2020-01-03 NOTE — Telephone Encounter (Signed)
Called patient left message on personal voice mail Dr.Jordan's advice.Advised I will mail letter to your home this afternoon.

## 2020-01-03 NOTE — Telephone Encounter (Signed)
Called patient, LVM advising that I would route to MD and Nurse to advise if okay to write letter.

## 2020-04-22 ENCOUNTER — Other Ambulatory Visit: Payer: Self-pay

## 2020-04-22 MED ORDER — ATORVASTATIN CALCIUM 80 MG PO TABS: 80.0000 mg | ORAL_TABLET | Freq: Every day | ORAL | 1 refills | Status: DC

## 2020-05-22 ENCOUNTER — Other Ambulatory Visit: Payer: Self-pay

## 2020-05-22 MED ORDER — LOSARTAN POTASSIUM 25 MG PO TABS: 25.0000 mg | ORAL_TABLET | Freq: Every day | ORAL | 4 refills | Status: DC

## 2020-08-25 ENCOUNTER — Other Ambulatory Visit: Payer: Self-pay

## 2020-08-25 MED ORDER — LOSARTAN POTASSIUM 25 MG PO TABS: 25.0000 mg | ORAL_TABLET | Freq: Every day | ORAL | 1 refills | Status: DC

## 2020-10-09 ENCOUNTER — Other Ambulatory Visit: Payer: Self-pay

## 2020-10-09 MED ORDER — CARVEDILOL 6.25 MG PO TABS: 6.2500 mg | ORAL_TABLET | Freq: Two times a day (BID) | ORAL | 1 refills | Status: DC

## 2020-10-11 ENCOUNTER — Other Ambulatory Visit: Payer: Self-pay | Admitting: Cardiology

## 2020-10-28 ENCOUNTER — Other Ambulatory Visit: Payer: Self-pay

## 2020-10-28 MED ORDER — LOSARTAN POTASSIUM 25 MG PO TABS: 25.0000 mg | ORAL_TABLET | Freq: Every day | ORAL | 0 refills | Status: DC

## 2020-10-29 IMAGING — CT CT HEAD W/O CM
4 series · 17 of 47 positions shown, 19 images · non-contrast
Comparison: None.

CLINICAL DATA: Ataxia

EXAM:
CT HEAD WITHOUT CONTRAST
TECHNIQUE: Contiguous axial images were obtained from the base of the skull
through the vertex without intravenous contrast.

[Series 3: head wo · axial · 0.43mm/px · z∈[-72,+48]mm · 7 of 33 slices shown, 9 images]
[im 5/33  brain]
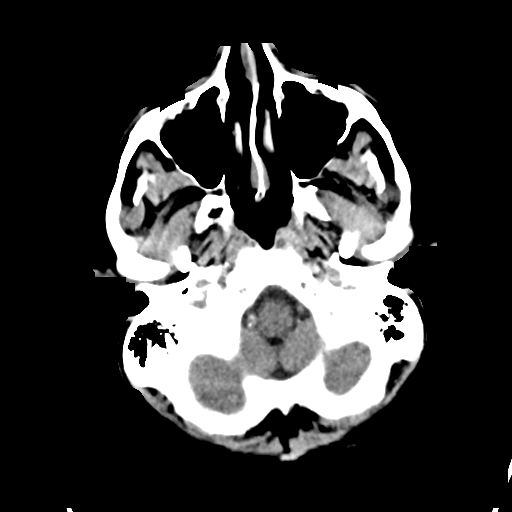
[im 5/33  bone]
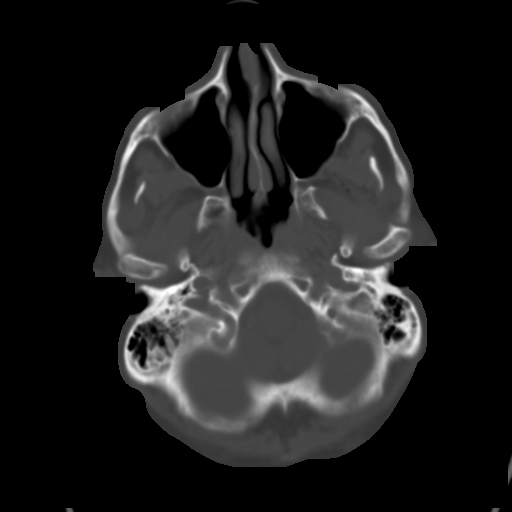
[im 9/33  brain]
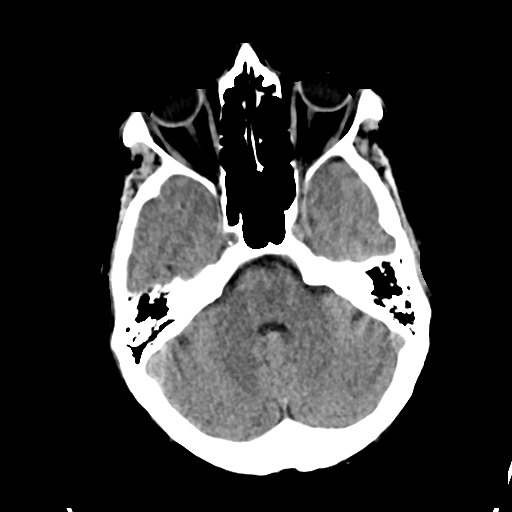
[im 13/33  brain]
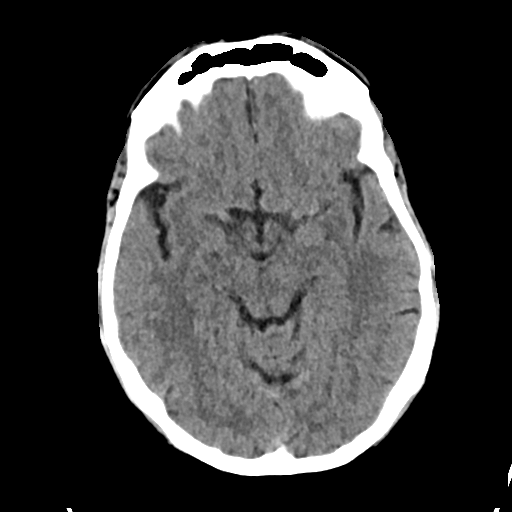
[im 17/33  brain]
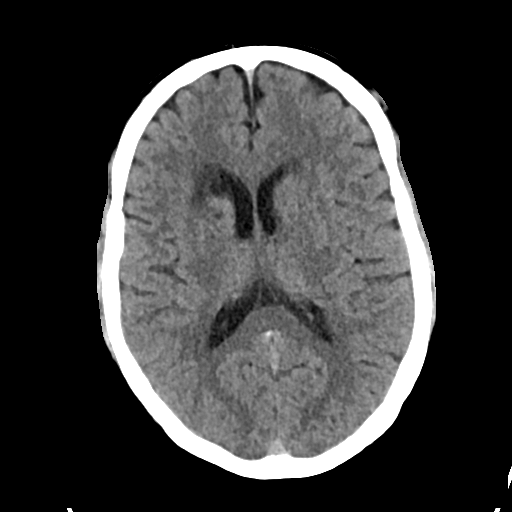
[im 21/33  brain]
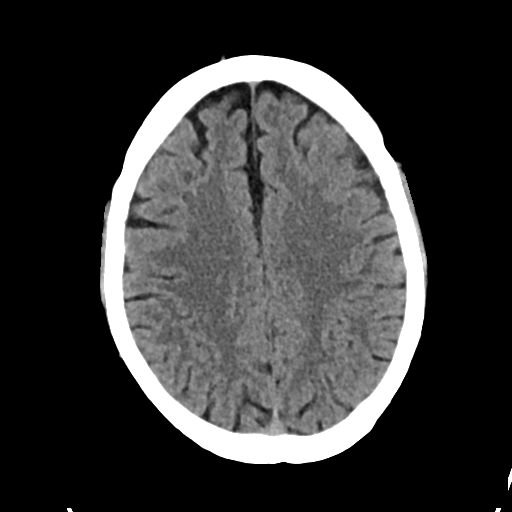
[im 21/33  bone]
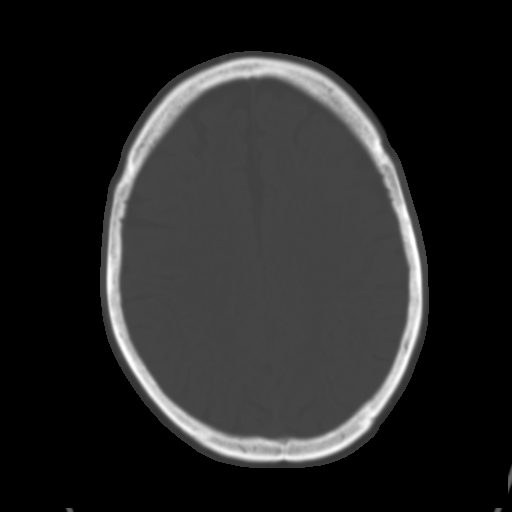
[im 25/33  brain]
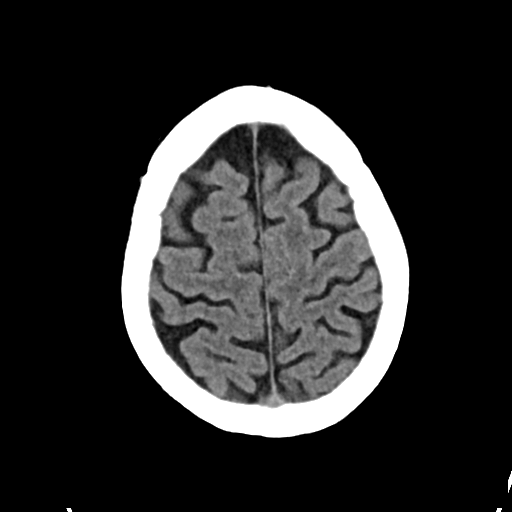
[im 29/33  brain]
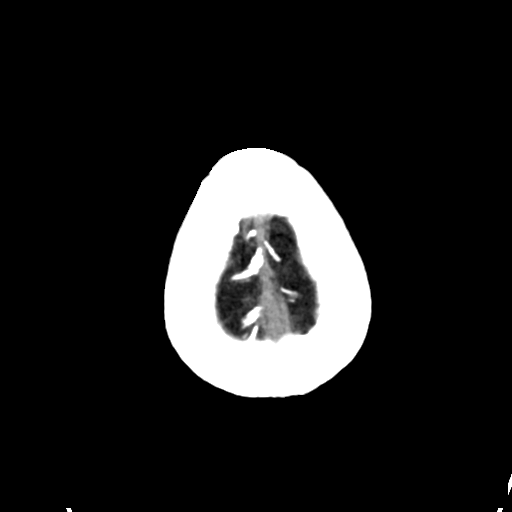

[Series 4: head bone · axial · 0.43mm/px · z∈[-76,-20]mm · 4 of 83 slices shown]
[im 9/83  bone]
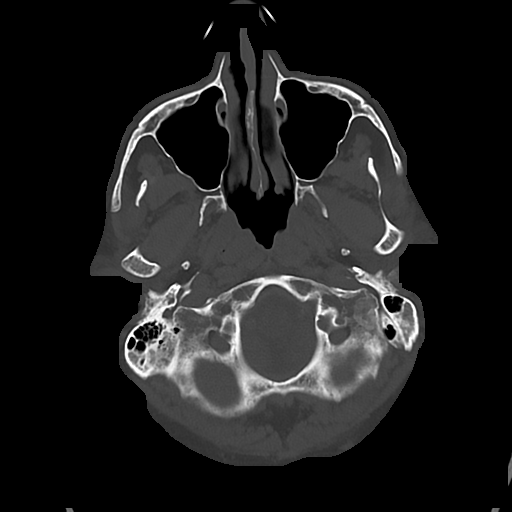
[im 17/83  bone]
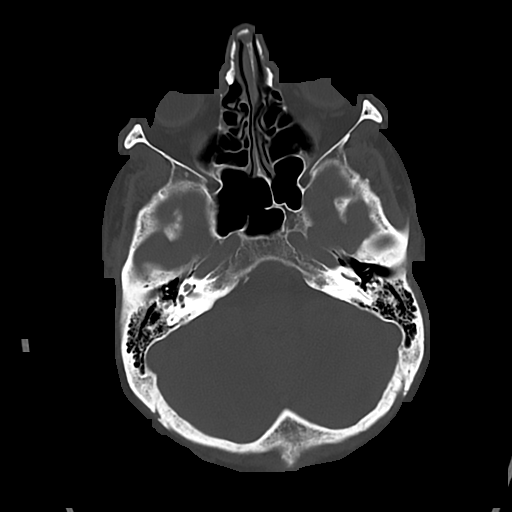
[im 25/83  bone]
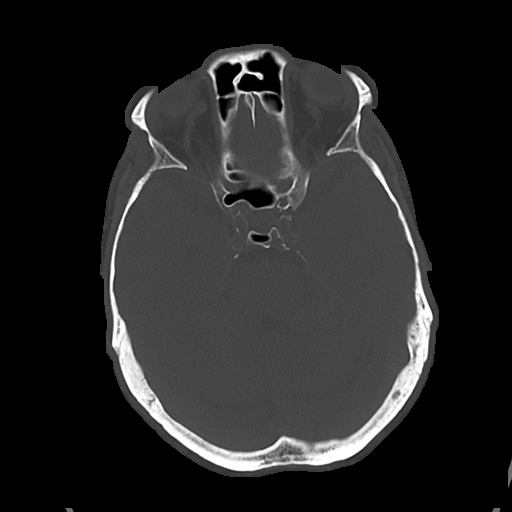
[im 37/83  bone]
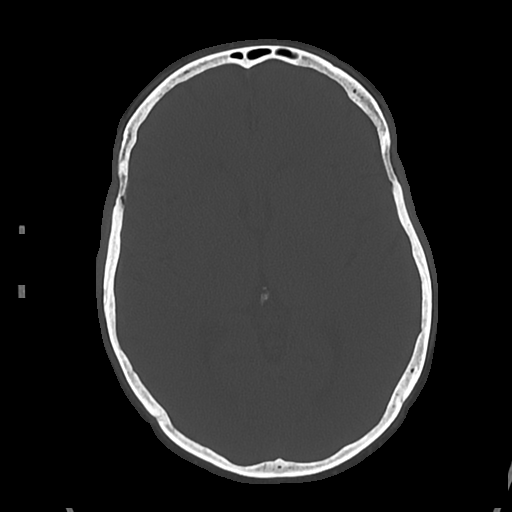

[Series 5: cor soft · coronal · 0.34mm/px · 3 of 73 slices shown]
[im 25/73  brain]
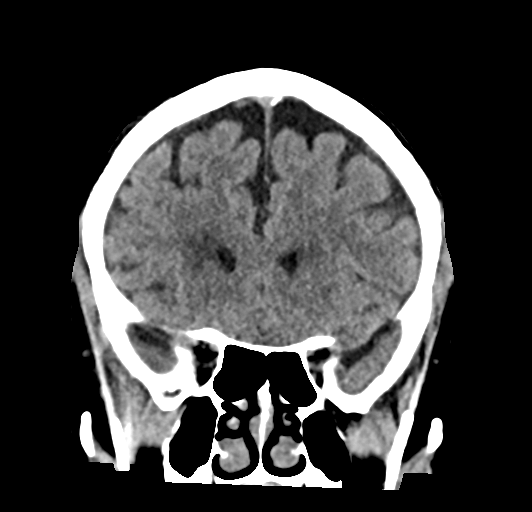
[im 33/73  brain]
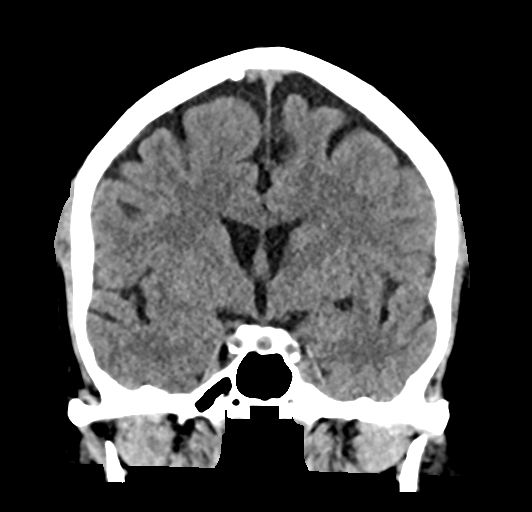
[im 41/73  brain]
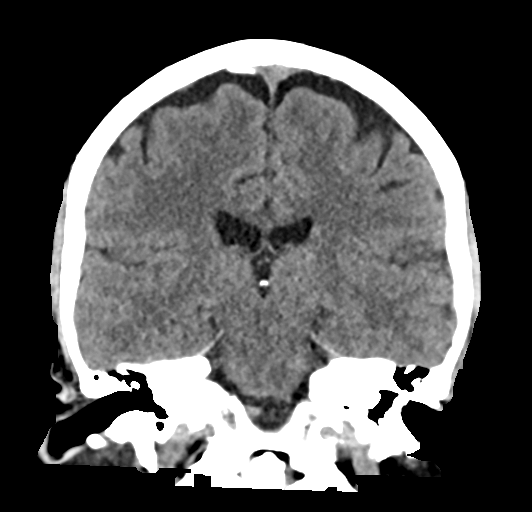

[Series 6: sag soft · sagittal · 0.35mm/px · 3 of 58 slices shown]
[im 20/58  brain]
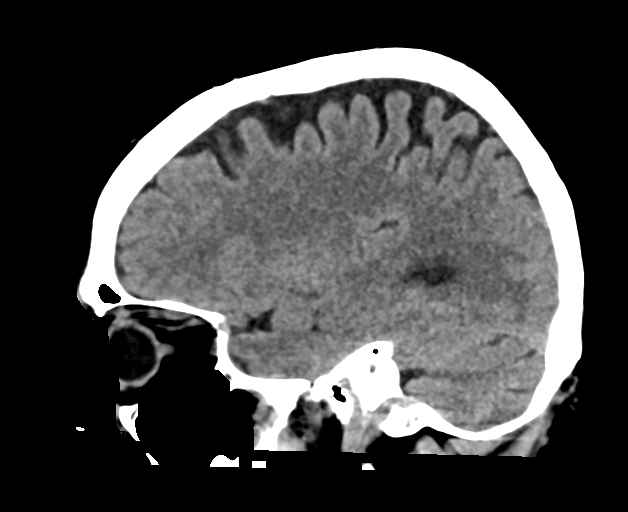
[im 29/58  brain]
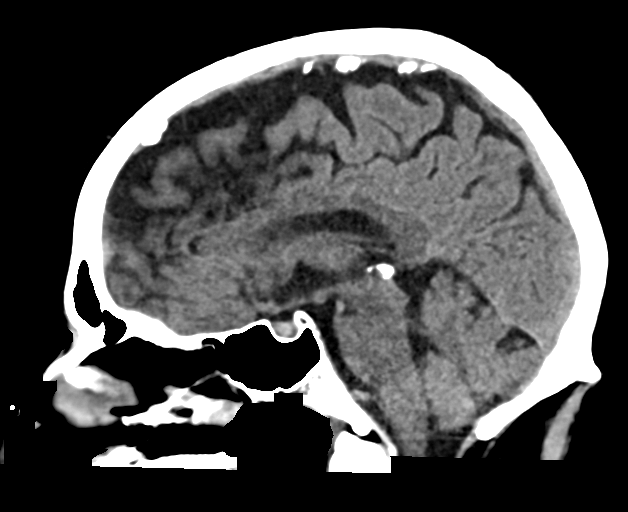
[im 39/58  brain]
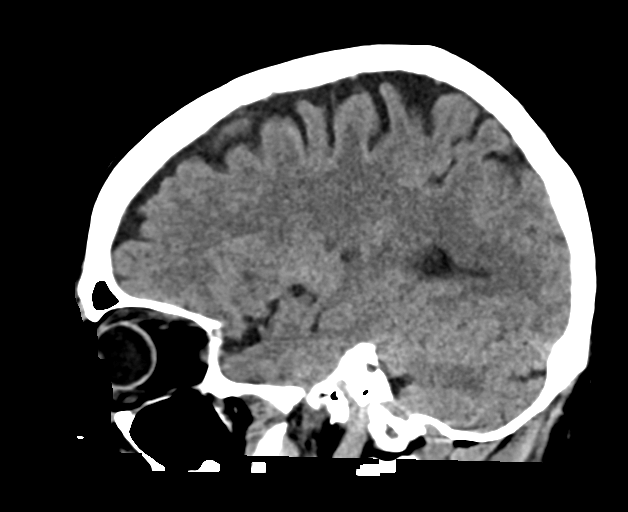

[17 of 47 positions shown; findings below may reference images not displayed]

FINDINGS: Brain: There is no mass, hemorrhage or extra-axial collection. The
size and configuration of the ventricles and extra-axial CSF spaces
are normal. There is hypoattenuation of the white matter, most
commonly indicating chronic small vessel disease. There are old
bilateral basal ganglia small vessel infarcts.

Vascular: No abnormal hyperdensity of the major intracranial
arteries or dural venous sinuses. No intracranial atherosclerosis.

Skull: The visualized skull base, calvarium and extracranial soft
tissues are normal.

Sinuses/Orbits: No fluid levels or advanced mucosal thickening of
the visualized paranasal sinuses. No mastoid or middle ear effusion.
The orbits are normal.
IMPRESSION: Chronic small vessel disease and old bilateral basal ganglia small
vessel infarcts without acute intracranial abnormality.

## 2020-12-03 ENCOUNTER — Other Ambulatory Visit: Payer: Self-pay | Admitting: *Deleted

## 2020-12-03 ENCOUNTER — Other Ambulatory Visit: Payer: Self-pay | Admitting: Cardiology

## 2020-12-03 MED ORDER — LOSARTAN POTASSIUM 25 MG PO TABS: 25.0000 mg | ORAL_TABLET | Freq: Every day | ORAL | 0 refills | Status: DC

## 2020-12-03 NOTE — Telephone Encounter (Signed)
Rx(s) sent to pharmacy electronically.  

## 2020-12-30 ENCOUNTER — Other Ambulatory Visit: Payer: Self-pay | Admitting: Cardiology

## 2021-01-08 ENCOUNTER — Telehealth: Payer: Self-pay | Admitting: Cardiology

## 2021-01-08 MED ORDER — CARVEDILOL 6.25 MG PO TABS: ORAL_TABLET | ORAL | 0 refills | Status: DC

## 2021-01-08 MED ORDER — LOSARTAN POTASSIUM 25 MG PO TABS: 25.0000 mg | ORAL_TABLET | Freq: Every day | ORAL | 0 refills | Status: DC

## 2021-01-08 MED ORDER — ATORVASTATIN CALCIUM 80 MG PO TABS: 80.0000 mg | ORAL_TABLET | Freq: Every day | ORAL | 0 refills | Status: DC

## 2021-01-08 NOTE — Addendum Note (Signed)
Addended by: Lubertha Sayres on: 01/08/2021 09:58 AM   Modules accepted: Orders

## 2021-01-08 NOTE — Telephone Encounter (Signed)
   *  STAT* If patient is at the pharmacy, call can be transferred to refill team.   1. Which medications need to be refilled? (please list name of each medication and dose if known)   atorvastatin (LIPITOR) 80 MG tablet    carvedilol (COREG) 6.25 MG tablet    losartan (COZAAR) 25 MG tablet   2. Which pharmacy/location (including street and city if local pharmacy) is medication to be sent to? [place], [place]  3. Do they need a 30 day or 90 day supply? 90 days   Pt needs refill today, our of meds. Also, pt made an appt with Dr. Martinique

## 2021-01-20 ENCOUNTER — Other Ambulatory Visit: Payer: Self-pay | Admitting: Cardiology

## 2021-02-25 ENCOUNTER — Other Ambulatory Visit: Payer: Self-pay | Admitting: Cardiology

## 2021-03-18 ENCOUNTER — Other Ambulatory Visit: Payer: Self-pay | Admitting: Cardiology

## 2021-04-05 ENCOUNTER — Other Ambulatory Visit: Payer: Self-pay | Admitting: Cardiology

## 2021-05-11 ENCOUNTER — Other Ambulatory Visit: Payer: Self-pay | Admitting: Cardiology

## 2021-05-14 NOTE — Progress Notes (Signed)
Cardiology Office Note  Date: 05/18/2021   ID: James Mullins, DOB 20-Oct-1960, MRN [id number]  PCP:  Maurice Small, MD (Inactive)  Cardiologist:  Daylani Deblois Martinique, MD Electrophysiologist:  None  Oncologist: Dr. Leslie Andrea of [facility]  Chief Complaint  Patient presents with   Coronary Artery Disease     History of Present Illness: James Mullins is a 60 y.o. male for follow up CAD.  Past medical history of inferior STEMI status post DES to mid RCA in 09/2015, history of 3 upper GI bleeds in 2017, ischemic cardiomyopathy, chronic systolic heart failure, HTN, HLD and OSA on CPAP.  Last ejection fraction 40 to 45% based on echocardiogram on 09/14/2015. 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  He is being seen by oncology at [facility]. Follow up CT in August showed no recurrence. With surgery he lost 100 lbs and was able to come off CPAP.  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 This showed a small fixed defect without ischemia. EF improved to 46%.   On follow up today he is doing very well. No chest pain or dyspnea. No edema or palpitations. 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     Past Medical History:  Diagnosis Date   CAD (coronary artery disease)    a. 09/2015 Inf STEMI/PCI: LM nl, LAD nl, RI nl, LCX nl, RCA 55m (3.5 x 16 Promus Premier MR DES).   Chronic combined systolic and diastolic CHF (congestive heart failure) ([place]) 09/2015   a. 09/2009 EF 35-45% by LV gram @ time of MI;  b. 09/2015 Echo: EF 40-45%, Gr2 DD, mild MR, mod-sev dil LA, triv TR, PASP 55mmHg.   Hyperlipidemia    Iron deficiency anemia 09/2015   Ischemic cardiomyopathy    a. 09/2015 Echo: EF 40-45%   MI (myocardial infarction) ([place]) 2//112017   Obesity (BMI 30.0-34.9) 2015   OSA on CPAP 08/2013   snoring. sleep study confirmed.  Dr Asencion Partridge Dohmeier, pt does not knoe cpap settings   Upper GI bleed 10/29/2015   a. 09/2015 EGD: multiple  subepithelial gastric nodules with overlying multiple, oozing ulcers.  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 Now followed @ [facility] wth plan for repeat EGD/colonoscopy.    Past Surgical History:  Procedure Laterality Date   CARDIAC CATHETERIZATION N/A 09/12/2015   Left Heart Cath and Coronary Angiography; Dr Caly Pellum M Martinique, MD;    CARDIAC CATHETERIZATION N/A 09/12/2015   placement Promus stent to mid RCA, Dr Veida Spira M Martinique, MD   ESOPHAGOGASTRODUODENOSCOPY N/A 10/29/2015   Procedure: ESOPHAGOGASTRODUODENOSCOPY (EGD);  Surgeon: Mauri Pole, MD;  Location: [facility];  Service: Endoscopy;  Laterality: N/A;   EUS N/A 11/05/2015   Procedure: FULL UPPER ENDOSCOPIC ULTRASOUND (EUS) RADIAL;  Surgeon: Milus Banister, MD;  Location: [facility];  Service: Endoscopy;  Laterality: N/A;    Current Outpatient Medications  Medication Sig Dispense Refill   aspirin EC 81 MG tablet Take 81 mg by mouth daily.     atorvastatin (LIPITOR) 80 MG tablet TAKE ONE TABLET BY MOUTH DAILY 30 tablet 1   carvedilol (COREG) 6.25 MG tablet TAKE ONE TABLET BY MOUTH TWICE A DAY WITH MEALS 90 tablet 0   cholecalciferol (VITAMIN D3) 25 MCG (1000 UNIT) tablet Take 1,000 Units by mouth daily. 1 Tablet Daily     cyanocobalamin (,VITAMIN B-12,) 1000 MCG/ML injection Inject into the muscle.     ferrous sulfate 325 (65 FE) MG EC  tablet Take 325 mg by mouth daily with breakfast. 1 Tablet Daily     losartan (COZAAR) 25 MG tablet Take 1 tablet (25 mg total) by mouth daily. KEEP OV. 30 tablet 1   Multiple Vitamins-Minerals (MULTIVITAMIN WITH MINERALS) tablet Take 1 tablet by mouth daily.     nitroGLYCERIN (NITROSTAT) 0.4 MG SL tablet Place 1 tablet (0.4 mg total) under the tongue every 5 (five) minutes as needed for chest pain (CP or SOB). 25 tablet 5   No current facility-administered medications for this visit.   Allergies:  Patient has no known allergies.   Social History: The patient  reports that he has never smoked. His smokeless  tobacco use includes chew. He reports current alcohol use. He reports that he does not use drugs.   Family History: The patient's family history includes Colon cancer in his mother; Stroke in his father.   ROS:  Please see the history of present illness. Otherwise, complete review of systems is positive for .  All other systems are reviewed and negative.   Physical Exam: VS:  BP 132/78   Pulse (!) 58   Ht 5\' 9"  (1.753 m)   Wt 188 lb 3.2 oz (85.4 kg)   SpO2 99%   BMI 27.79 kg/m , BMI Body mass index is 27.79 kg/m.  Wt Readings from Last 3 Encounters:  05/18/21 188 lb 3.2 oz (85.4 kg)  12/17/19 185 lb (83.9 kg)  09/04/19 185 lb (83.9 kg)    General: Patient appears comfortable at rest. HEENT: Conjunctiva and lids normal, oropharynx clear with moist mucosa. Neck: Supple, no elevated JVP or carotid bruits, no thyromegaly. Lungs: Clear to auscultation, nonlabored breathing at rest. Cardiac: Regular rate and rhythm, no S3 or significant systolic murmur, no pericardial rub. Abdomen: Soft, nontender, no hepatomegaly, bowel sounds present, no guarding or rebound. Extremities: No pitting edema, distal pulses 2+. Skin: Warm and dry. Musculoskeletal: No kyphosis. Neuropsychiatric: Alert and oriented x3, affect grossly appropriate.  ECG:  today shows NSR with normal Ecg. I have personally reviewed and interpreted this study.   Recent Labwork: No results found for requested labs within last 8760 hours.     Component Value Date/Time   CHOL 139 03/14/2018 1222   TRIG 91 03/14/2018 1222   HDL 75 03/14/2018 1222   CHOLHDL 1.9 03/14/2018 1222   CHOLHDL 3.8 11/20/2015 1001   VLDL 42 (H) 11/20/2015 1001   LDLCALC 46 03/14/2018 1222   Dated 08/28/20: cholesterol 154, triglycerides 68, HDL 88, LDL 53. CMET and TSH normal  Other Studies Reviewed Today:   Echo 09/14/15: Study Conclusions - Left ventricle: The cavity size was mildly dilated. Systolic   function was mildly to moderately  reduced. The estimated ejection   fraction was in the range of 40% to 45%. There is akinesis of the   basalinferior myocardium. Features are consistent with a   pseudonormal left ventricular filling pattern, with concomitant   abnormal relaxation and increased filling pressure (grade 2   diastolic dysfunction). - Mitral valve: There was mild regurgitation. - Left atrium: The atrium was moderately to severely dilated. - Tricuspid valve: There was trivial regurgitation. - Pulmonary arteries: PA peak pressure: 31 mm Hg (S).     Left heart cath 09/12/15: Study Conclusions  - Left ventricle: The cavity size was mildly dilated. Systolic   function was mildly to moderately reduced. The estimated ejection   fraction was in the range of 40% to 45%. There is akinesis of the  basalinferior myocardium. Features are consistent with a   pseudonormal left ventricular filling pattern, with concomitant   abnormal relaxation and increased filling pressure (grade 2   diastolic dysfunction). - Mitral valve: There was mild regurgitation. - Left atrium: The atrium was moderately to severely dilated. - Tricuspid valve: There was trivial regurgitation. - Pulmonary arteries: PA peak pressure: 31 mm Hg (S).   Study Highlights    The left ventricular ejection fraction is mildly decreased (45-54%). Nuclear stress EF: 46%. There was no ST segment deviation noted during stress. Findings consistent with prior myocardial infarction. This is a low risk study.   There is a small defect of mild severity present in the apical inferior and apex location. This defect is fixed, and likely represents prior infarct.    No ischemia on perfusion images  Assessment and Plan:  1. Coronary artery disease involving native coronary artery of native heart without angina pectoris   2. Chronic combined systolic and diastolic CHF (congestive heart failure) ([place])   3. Essential hypertension   4. Hyperlipidemia LDL goal <70      1.  CAD: Myoview in 2021 without ischemia.  Last PCI was in 2017. No symptoms of angina. On ASA, statin, beta blocker. 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   2.  Chronic combined systolic and diastolic heart failure/ischemic cardiomyopathy: EF 46%.  Euvolemic on physical exam  3. Hypertension: Blood pressure stable  4. Hyperlipidemia: On Lipitor 80 mg daily. Lipids have been excellent.   5. Obstructive sleep apnea: off CPAP since he lost 100 lbs.   6. Neuroendocrine tumor s/p resection. No recurrence.   Medication Adjustments/Labs and Tests Ordered: Current medicines are reviewed at length with the patient today.  Concerns regarding medicines are outlined above.    There are no Patient Instructions on file for this visit.       Signed, Levell July, NP 05/18/2021 1:54 PM    [facility] at [place], [place], [place] [address] Phone: [phone]; Fax: [phone]

## 2021-05-18 ENCOUNTER — Encounter: Payer: Self-pay | Admitting: Cardiology

## 2021-05-18 ENCOUNTER — Ambulatory Visit: Payer: Managed Care, Other (non HMO) | Admitting: Cardiology

## 2021-05-18 ENCOUNTER — Other Ambulatory Visit: Payer: Self-pay

## 2021-05-18 VITALS — BP 132/78 | HR 58 | Ht 69.0 in | Wt 188.2 lb

## 2021-05-18 DIAGNOSIS — I2510 Atherosclerotic heart disease of native coronary artery without angina pectoris: Secondary | ICD-10-CM

## 2021-05-18 DIAGNOSIS — I10 Essential (primary) hypertension: Secondary | ICD-10-CM

## 2021-05-18 DIAGNOSIS — E785 Hyperlipidemia, unspecified: Secondary | ICD-10-CM | POA: Diagnosis not present

## 2021-05-18 DIAGNOSIS — I5042 Chronic combined systolic (congestive) and diastolic (congestive) heart failure: Secondary | ICD-10-CM

## 2021-05-18 NOTE — Patient Instructions (Signed)
Medication Instructions:  Continue same medications *If you need a refill on your cardiac medications before your next appointment, please call your pharmacy*   Lab Work: None ordered   Testing/Procedures: Schedule Stress Myoview in March 2023   Follow-Up: At [facility], you and your health needs are our priority.  As part of our continuing mission to provide you with exceptional heart care, we have created designated Provider Care Teams.  These Care Teams include your primary Cardiologist (physician) and Advanced Practice Providers (APPs -  Physician Assistants and Nurse Practitioners) who all work together to provide you with the care you need, when you need it.  We recommend signing up for the patient portal called "MyChart".  Sign up information is provided on this After Visit Summary.  MyChart is used to connect with patients for Virtual Visits (Telemedicine).  Patients are able to view lab/test results, encounter notes, upcoming appointments, etc.  Non-urgent messages can be sent to your provider as well.   To learn more about what you can do with MyChart, go to [email or web address].      Your next appointment:  1 year   Call in July to schedule Oct appointment     The format for your next appointment: Office   Provider:  Dr.Jordan

## 2021-05-18 NOTE — Addendum Note (Signed)
Addended by: Kathyrn Lass on: 05/18/2021 02:01 PM   Modules accepted: Orders

## 2021-06-07 ENCOUNTER — Other Ambulatory Visit: Payer: Self-pay

## 2021-06-07 MED ORDER — LOSARTAN POTASSIUM 25 MG PO TABS
25.0000 mg | ORAL_TABLET | Freq: Every day | ORAL | 1 refills | Status: DC
Start: 2021-06-07 — End: 2021-08-10

## 2021-06-09 ENCOUNTER — Other Ambulatory Visit: Payer: Self-pay | Admitting: Cardiology

## 2021-06-19 ENCOUNTER — Other Ambulatory Visit: Payer: Self-pay | Admitting: Cardiology

## 2021-08-10 ENCOUNTER — Other Ambulatory Visit: Payer: Self-pay | Admitting: Cardiology

## 2021-08-12 ENCOUNTER — Other Ambulatory Visit: Payer: Self-pay | Admitting: Cardiology

## 2021-09-26 ENCOUNTER — Other Ambulatory Visit: Payer: Self-pay | Admitting: Cardiology

## 2021-10-07 ENCOUNTER — Telehealth (HOSPITAL_COMMUNITY): Payer: Self-pay | Admitting: *Deleted

## 2021-10-07 NOTE — Telephone Encounter (Signed)
Close encounter 

## 2021-10-08 ENCOUNTER — Other Ambulatory Visit: Payer: Self-pay

## 2021-10-08 ENCOUNTER — Ambulatory Visit (HOSPITAL_COMMUNITY)
Admission: RE | Admit: 2021-10-08 | Discharge: 2021-10-08 | Disposition: A | Discharge status: Home / Self Care | Payer: Managed Care, Other (non HMO) | Source: Ambulatory Visit | Attending: Cardiovascular Disease | Admitting: Cardiovascular Disease

## 2021-10-08 DIAGNOSIS — I2510 Atherosclerotic heart disease of native coronary artery without angina pectoris: Secondary | ICD-10-CM | POA: Diagnosis present

## 2021-10-08 DIAGNOSIS — E785 Hyperlipidemia, unspecified: Secondary | ICD-10-CM | POA: Diagnosis present

## 2021-10-08 DIAGNOSIS — I10 Essential (primary) hypertension: Secondary | ICD-10-CM | POA: Diagnosis present

## 2021-10-08 DIAGNOSIS — I5042 Chronic combined systolic (congestive) and diastolic (congestive) heart failure: Secondary | ICD-10-CM | POA: Diagnosis present

## 2021-10-08 LAB — MYOCARDIAL PERFUSION IMAGING
Estimated workload: 7
Exercise duration (min): 7 min
Exercise duration (sec): 0 s
LV dias vol: 172 mL (ref 62–150)
LV sys vol: 72 mL
MPHR: 160 {beats}/min
Nuc Stress EF: 58 %
Peak HR: 142 {beats}/min
Percent HR: 88 %
Rest HR: 45 {beats}/min
Rest Nuclear Isotope Dose: 10.4 mCi
SDS: 4
SRS: 1
SSS: 5
ST Depression (mm): 0 mm
Stress Nuclear Isotope Dose: 29.6 mCi
TID: 0.89

## 2021-10-08 MED ORDER — TECHNETIUM TC 99M TETROFOSMIN IV KIT
29.6000 | PACK | Freq: Once | INTRAVENOUS | Status: AC | PRN
  Administered 2021-10-08: 29.6 via INTRAVENOUS
  Filled 2021-10-08: qty 30

## 2021-10-08 MED ORDER — TECHNETIUM TC 99M TETROFOSMIN IV KIT
10.4000 | PACK | Freq: Once | INTRAVENOUS | Status: AC | PRN
  Administered 2021-10-08: 10.4 via INTRAVENOUS
  Filled 2021-10-08: qty 11

## 2021-10-23 ENCOUNTER — Other Ambulatory Visit: Payer: Self-pay | Admitting: Cardiology

## 2022-01-10 ENCOUNTER — Ambulatory Visit: Payer: Managed Care, Other (non HMO)

## 2022-03-27 ENCOUNTER — Other Ambulatory Visit: Payer: Self-pay | Admitting: Cardiology

## 2022-04-12 ENCOUNTER — Ambulatory Visit: Payer: Managed Care, Other (non HMO) | Admitting: Internal Medicine

## 2022-05-19 ENCOUNTER — Other Ambulatory Visit: Payer: Self-pay | Admitting: Cardiology

## 2022-08-21 ENCOUNTER — Other Ambulatory Visit: Payer: Self-pay | Admitting: Cardiology

## 2022-08-24 ENCOUNTER — Other Ambulatory Visit: Payer: Self-pay

## 2022-08-30 NOTE — Progress Notes (Unsigned)
Cardiology Office Note  Date: 05/18/2021   ID: James Mullins, DOB June 04, 1961, MRN [id number]  PCP:  James Small, MD (Inactive)  Cardiologist:  Freddye Cardamone Martinique, MD Electrophysiologist:  None  Oncologist: Dr. Leslie Mullins of [facility]  Chief Complaint  Patient presents with   Coronary Artery Disease     History of Present Illness: James Mullins is a 62 y.o. male for follow up CAD.  Past medical history of inferior STEMI status post DES to mid RCA in 09/2015, history of 3 upper GI bleeds in 2017, ischemic cardiomyopathy, chronic systolic heart failure, HTN, HLD and OSA on CPAP.  Last ejection fraction 40 to 45% based on echocardiogram on 09/14/2015. 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  He is being seen by oncology at [facility] Follow up CT in August showed no recurrence. With surgery he lost 100 lbs and was able to come off CPAP.  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 EF improved to 46%. Last Myoview in March 2023 he was able to walk 7 minutes on Bruce protocol. No chest pain or Ecg changes. Myoview was unchanged.  On follow up today he is doing very well. 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 Reports prior to this he was doing well.     Past Medical History:  Diagnosis Date   CAD (coronary artery disease)    a. 09/2015 Inf STEMI/PCI: LM nl, LAD nl, RI nl, LCX nl, RCA 94m(3.5 x 16 Promus Premier MR DES).   Chronic combined systolic and diastolic CHF (congestive heart failure) ([place] 09/2015   a. 09/2009 EF 35-45% by LV gram @ time of MI;  b. 09/2015 Echo: EF 40-45%, Gr2 DD, mild MR, mod-sev dil LA, triv TR, PASP 374mg.   Hyperlipidemia    Iron deficiency anemia 09/2015   Ischemic cardiomyopathy    a. 09/2015 Echo: EF 40-45%   [place]) [place]//112017   Obesity (BMI 30.0-34.9) 2015   OSA on CPAP  08/2013   snoring. sleep study confirmed.  Dr CaAsencion Partridgeohmeier, pt does not knoe cpap settings   Upper GI bleed 10/29/2015   a. 09/2015 EGD: multiple subepithelial gastric nodules with overlying multiple, oozing ulcers.  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 Now followed @ [facility] wth plan for repeat EGD/colonoscopy.    Past Surgical History:  Procedure Laterality Date   CARDIAC CATHETERIZATION N/A 09/12/2015   Left Heart Cath and Coronary Angiography; Dr James Mullins M JoMartiniqueMD;    CARDIAC CATHETERIZATION N/A 09/12/2015   placement Promus stent to mid RCA, Dr James Mullins M JoMartiniqueMD   ESOPHAGOGASTRODUODENOSCOPY N/A 10/29/2015   Procedure: ESOPHAGOGASTRODUODENOSCOPY (EGD);  Surgeon: James PoleMD;  [facility];  Service: Endoscopy;  Laterality: N/A;   EUS N/A 11/05/2015   Procedure: FULL UPPER ENDOSCOPIC ULTRASOUND (EUS) RADIAL;  Surgeon: James BanisterMD;  Location: [facility];  Service: Endoscopy;  Laterality: N/A;    Current Outpatient Medications  Medication Sig Dispense Refill   aspirin EC 81 MG tablet Take 81 mg by mouth daily.     atorvastatin (LIPITOR) 80 MG tablet TAKE ONE TABLET BY MOUTH DAILY 30 tablet 1   carvedilol (COREG) 6.25 MG tablet TAKE ONE TABLET BY MOUTH TWICE A DAY WITH MEALS 90 tablet 0   cholecalciferol (VITAMIN D3) 25 MCG (1000 UNIT) tablet Take 1,000  Units by mouth daily. 1 Tablet Daily     cyanocobalamin (,VITAMIN B-12,) 1000 MCG/ML injection Inject into the muscle.     ferrous sulfate 325 (65 FE) MG EC tablet Take 325 mg by mouth daily with breakfast. 1 Tablet Daily     losartan (COZAAR) 25 MG tablet Take 1 tablet (25 mg total) by mouth daily. KEEP OV. 30 tablet 1   Multiple Vitamins-Minerals (MULTIVITAMIN WITH MINERALS) tablet Take 1 tablet by mouth daily.     nitroGLYCERIN (NITROSTAT) 0.4 MG SL tablet Place 1 tablet (0.4 mg total) under the tongue every 5 (five) minutes as needed for chest pain (CP or SOB). 25 tablet 5   No current facility-administered  medications for this visit.   Allergies:  Patient has no known allergies.   Social History: The patient  reports that he has never smoked. His smokeless tobacco use includes chew. He reports current alcohol use. He reports that he does not use drugs.   Family History: The patient's family history includes Colon cancer in his mother; Stroke in his father.   ROS:  Please see the history of present illness. Otherwise, complete review of systems is positive for .  All other systems are reviewed and negative.   Physical Exam: VS:  BP 132/78   Pulse (!) 58   Ht '5\' 9"'$  (1.753 m)   Wt 188 lb 3.2 oz (85.4 kg)   SpO2 99%   BMI 27.79 kg/m , BMI Body mass index is 27.79 kg/m.  Wt Readings from Last 3 Encounters:  05/18/21 188 lb 3.2 oz (85.4 kg)  12/17/19 185 lb (83.9 kg)  09/04/19 185 lb (83.9 kg)    General: NAD HEENT:Normal Neck: Supple, no elevated JVP or carotid bruits, no thyromegaly. Lungs: Clear to auscultation, nonlabored breathing at rest. Cardiac: Regular rate and rhythm, no S3 or significant systolic murmur, no pericardial rub. Abdomen: Soft, nontender, no hepatomegaly, bowel sounds present, no guarding or rebound. Extremities: No pitting edema, distal pulses 2+. Skin: Warm and dry. Musculoskeletal: No kyphosis. Neuropsychiatric: Alert and oriented x3, affect grossly appropriate.  ECG:  today shows NSR with normal Ecg. Rate 63. I have personally reviewed and interpreted this study.   Recent Labwork: No results found for requested labs within last 8760 hours.     Component Value Date/Time   CHOL 139 03/14/2018 1222   TRIG 91 03/14/2018 1222   HDL 75 03/14/2018 1222   CHOLHDL 1.9 03/14/2018 1222   CHOLHDL 3.8 11/20/2015 1001   VLDL 42 (H) 11/20/2015 1001   LDLCALC 46 03/14/2018 1222   Dated 08/28/20: cholesterol 154, triglycerides 68, HDL 88, LDL 53. CMET and TSH normal Dated 06/22/22: cholesterol  154, triglycerides 76, HDL 84, LDL 56, CMET, TSH, CBC normal   Other  Studies Reviewed Today:   Echo 09/14/15: Study Conclusions - Left ventricle: The cavity size was mildly dilated. Systolic   function was mildly to moderately reduced. The estimated ejection   fraction was in the range of 40% to 45%. There is akinesis of the   basalinferior myocardium. Features are consistent with a   pseudonormal left ventricular filling pattern, with concomitant   abnormal relaxation and increased filling pressure (grade 2   diastolic dysfunction). - Mitral valve: There was mild regurgitation. - Left atrium: The atrium was moderately to severely dilated. - Tricuspid valve: There was trivial regurgitation. - Pulmonary arteries: PA peak pressure: 31 mm Hg (S).     Left heart cath 09/12/15: Study Conclusions  - Left  ventricle: The cavity size was mildly dilated. Systolic   function was mildly to moderately reduced. The estimated ejection   fraction was in the range of 40% to 45%. There is akinesis of the   basalinferior myocardium. Features are consistent with a   pseudonormal left ventricular filling pattern, with concomitant   abnormal relaxation and increased filling pressure (grade 2   diastolic dysfunction). - Mitral valve: There was mild regurgitation. - Left atrium: The atrium was moderately to severely dilated. - Tricuspid valve: There was trivial regurgitation. - Pulmonary arteries: PA peak pressure: 31 mm Hg (S).   Study Highlights    The left ventricular ejection fraction is mildly decreased (45-54%). Nuclear stress EF: 46%. There was no ST segment deviation noted during stress. Findings consistent with prior myocardial infarction. This is a low risk study.   There is a Mullins defect of mild severity present in the apical inferior and apex location. This defect is fixed, and likely represents prior infarct.    No ischemia on perfusion images  Myoview 10/08/21: Study Highlights      The study is normal. The study is low risk.   Fair exercise  capacity, achieved 7.0 METS   Max heart rate 142 bpm (88% max age-predicted heart rate)   Normal BP response to exercise   No ST deviation was noted.   LV perfusion is abnormal. There is no evidence of ischemia. There is no evidence of infarction. Defect 1: There is a medium defect with mild reduction in uptake present in the apical to basal inferior location(s) that is fixed. There is normal wall motion in the defect area. Consistent with artifact caused by diaphragmatic attenuation.   Left ventricular function is normal. End diastolic cavity size is moderately enlarged. End systolic cavity size is moderately enlarged.   Prior study available for comparison from 12/17/2019.   Fixed inferior perfusion defect with normal wall motion suggests artifact Low risk study  Assessment and Plan:  1. Coronary artery disease involving native coronary artery of native heart without angina pectoris   2. Chronic combined systolic and diastolic CHF (congestive heart failure) ([place])   3. Essential hypertension   4. Hyperlipidemia LDL goal <70     1.  CAD: Myoview in March 2023 looked good.  Last PCI was in 2017. No symptoms of angina. On ASA, statin, beta blocker. Will refill meds today.   2.  Chronic combined systolic and diastolic heart failure/ischemic cardiomyopathy: EF 46%.  Euvolemic on physical exam  3. Hypertension: Blood pressure elevated today but ran out of meds. Will renew and monitor.   4. Hyperlipidemia: On Lipitor 80 mg daily. Lipids have been excellent. LDL 56  5. Obstructive sleep apnea: off CPAP since he lost 100 lbs.   6. Neuroendocrine tumor s/p resection. No recurrence.   Medication Adjustments/Labs and Tests Ordered: Current medicines are reviewed at length with the patient today.  Concerns regarding medicines are outlined above.    Follow up one year   Signed, Levell July, NP 05/18/2021 1:54 PM    [facility] at [place], [place],  [place] [address] Phone: [phone]; Fax: [phone]

## 2022-09-05 ENCOUNTER — Ambulatory Visit: Payer: Managed Care, Other (non HMO) | Attending: Cardiology | Admitting: Cardiology

## 2022-09-05 ENCOUNTER — Encounter: Payer: Self-pay | Admitting: Cardiology

## 2022-09-05 VITALS — BP 170/98 | HR 63 | Ht 69.0 in | Wt 184.0 lb

## 2022-09-05 DIAGNOSIS — E785 Hyperlipidemia, unspecified: Secondary | ICD-10-CM

## 2022-09-05 DIAGNOSIS — I2510 Atherosclerotic heart disease of native coronary artery without angina pectoris: Secondary | ICD-10-CM

## 2022-09-05 DIAGNOSIS — I5042 Chronic combined systolic (congestive) and diastolic (congestive) heart failure: Secondary | ICD-10-CM

## 2022-09-05 DIAGNOSIS — I10 Essential (primary) hypertension: Secondary | ICD-10-CM | POA: Diagnosis not present

## 2022-09-05 MED ORDER — CARVEDILOL 6.25 MG PO TABS: 6.2500 mg | ORAL_TABLET | Freq: Two times a day (BID) | ORAL | 3 refills | Status: DC

## 2022-09-05 MED ORDER — ATORVASTATIN CALCIUM 80 MG PO TABS: 80.0000 mg | ORAL_TABLET | Freq: Every day | ORAL | 3 refills | Status: DC

## 2022-09-05 MED ORDER — NITROGLYCERIN 0.4 MG SL SUBL: 0.4000 mg | SUBLINGUAL_TABLET | SUBLINGUAL | 5 refills | Status: AC | PRN

## 2022-09-05 MED ORDER — LOSARTAN POTASSIUM 25 MG PO TABS: 25.0000 mg | ORAL_TABLET | Freq: Every day | ORAL | 3 refills | Status: DC

## 2022-09-05 NOTE — Patient Instructions (Signed)
Medication Instructions:   Your physician recommends that you continue on your current medications as directed. Please refer to the Current Medication list given to you today.  *If you need a refill on your cardiac medications before your next appointment, please call your pharmacy*  Lab Work: NONE ordered at this time of appointment   If you have labs (blood work) drawn today and your tests are completely normal, you will receive your results only by: MyChart Message (if you have MyChart) OR A paper copy in the mail If you have any lab test that is abnormal or we need to change your treatment, we will call you to review the results.  Testing/Procedures: NONE ordered at this time of appointment   Follow-Up: At [facility], you and your health needs are our priority.  As part of our continuing mission to provide you with exceptional heart care, we have created designated Provider Care Teams.  These Care Teams include your primary Cardiologist (physician) and Advanced Practice Providers (APPs -  Physician Assistants and Nurse Practitioners) who all work together to provide you with the care you need, when you need it.    Your next appointment:   1 year(s)  Provider:   Peter Jordan, MD     Other Instructions   

## 2022-09-06 NOTE — Addendum Note (Signed)
Addended by: Gean Birchwood on: 09/06/2022 04:13 PM   Modules accepted: Orders

## 2023-08-28 ENCOUNTER — Other Ambulatory Visit: Payer: Self-pay | Admitting: Cardiology

## 2023-09-03 NOTE — Progress Notes (Signed)
 Cardiology Office Note  Date: 09/06/2023   ID: James Mullins, DOB [birth year], MRN [id number]  PCP:  Douglass Ivanoff, MD  Cardiologist:  Lerlene Treadwell, MD Electrophysiologist:  None  Oncologist: Dr. Ozell Seat of [facility]  Chief Complaint  Patient presents with   Coronary Artery Disease     History of Present Illness: James Mullins is a 63 y.o. male for follow up CAD.  Past medical history of inferior STEMI status post DES to mid RCA in 09/2015, history of 3 upper GI bleeds in 2017, ischemic cardiomyopathy, chronic systolic heart failure, HTN, HLD and OSA on CPAP.  Last ejection fraction 40 to 45% based on echocardiogram on 09/14/2015. 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  He is being seen by oncology at [facility]. Follow up CT in August showed no recurrence. With surgery he lost 100 lbs and was able to come off CPAP.  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 This showed a small fixed defect without ischemia. EF improved to 46%. Last Myoview  in March 2023 he was able to walk 7 minutes on Bruce protocol. No chest pain or Ecg changes. Myoview  was unchanged. EF was 58%.   On follow up today he is doing very well. Still drives a truck for [employer]. Denies any chest pain or SOB. 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 Not able to walk on a treadmill due to bad knee. May need surgery. Is moving so has misplaced some of his meds. Even before BP typically 145/85.     Past Medical History:  Diagnosis Date   CAD (coronary artery disease)    a. 09/2015 Inf STEMI/PCI: LM nl, LAD nl, RI nl, LCX nl, RCA 53m (3.5 x 16 Promus Premier MR DES).   Chronic combined systolic and diastolic CHF (congestive heart failure) (HCC) 09/2015   a. 09/2009 EF 35-45% by LV gram @ time of MI;  b. 09/2015 Echo: EF 40-45%, Gr2 DD, mild MR, mod-sev dil LA, triv TR, PASP .   Hyperlipidemia    Iron deficiency anemia 09/2015   Ischemic cardiomyopathy     a. 09/2015 Echo: EF 40-45%   MI (myocardial infarction) (HCC) 2//112017   Obesity (BMI 30.0-34.9) 2015   OSA on CPAP 08/2013   snoring. sleep study confirmed.  Dr Dedra Dohmeier, pt does not knoe cpap settings   Upper GI bleed 10/29/2015   a. 09/2015 EGD: multiple subepithelial gastric nodules with overlying multiple, oozing ulcers.  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 Now followed @ [facility] wth plan for repeat EGD/colonoscopy.    Past Surgical History:  Procedure Laterality Date   CARDIAC CATHETERIZATION N/A 09/12/2015   Left Heart Cath and Coronary Angiography; Dr Gaberiel Youngblood M Marcy Bogosian, MD;    CARDIAC CATHETERIZATION N/A 09/12/2015   placement Promus stent to mid RCA, Dr Dorn Hartshorne M Sheva Mcdougle, MD   ESOPHAGOGASTRODUODENOSCOPY N/A 10/29/2015   Procedure: ESOPHAGOGASTRODUODENOSCOPY (EGD);  Surgeon: Gustav Shila GAILS, MD;  Location: [facility];  Service: Endoscopy;  Laterality: N/A;   EUS N/A 11/05/2015   Procedure: FULL UPPER ENDOSCOPIC ULTRASOUND (EUS) RADIAL;  Surgeon: Toribio SHAUNNA Cedar, MD;  Location: [facility];  Service: Endoscopy;  Laterality: N/A;    Current Outpatient Medications  Medication Sig Dispense Refill   aspirin  EC 81 MG tablet Take 81 mg by mouth daily.     atorvastatin  (LIPITOR ) 80 MG tablet Take 1 tablet (80 mg total) by mouth daily. Please keep scheduled appointment 90 tablet 3  carvedilol  (COREG ) 12.5 MG tablet Take 1 tablet (12.5 mg total) by mouth 2 (two) times daily. 180 tablet 3   cyanocobalamin (,VITAMIN B-12,) 1000 MCG/ML injection Inject into the muscle.     ferrous sulfate  325 (65 FE) MG EC tablet Take 325 mg by mouth daily with breakfast. 1 Tablet Daily     losartan  (COZAAR ) 50 MG tablet Take 1 tablet (50 mg total) by mouth daily. 90 tablet 3   Multiple Vitamins-Minerals (MULTIVITAMIN WITH MINERALS) tablet Take 1 tablet by mouth daily.     nitroGLYCERIN  (NITROSTAT ) 0.4 MG SL tablet Place 1 tablet (0.4 mg total) under the tongue every 5 (five) minutes as needed for chest pain (CP or  SOB). 25 tablet 5   No current facility-administered medications for this visit.   Allergies:  Patient has no known allergies.   Social History: The patient  reports that he has never smoked. His smokeless tobacco use includes chew. He reports current alcohol use. He reports that he does not use drugs.   Family History: The patient's family history includes Colon cancer in his mother; Stroke in his father.   ROS:  Please see the history of present illness. Otherwise, complete review of systems is positive for .  All other systems are reviewed and negative.   Physical Exam: VS:  BP (!) 168/96   Pulse 69   Ht 5' 10 (1.778 m)   Wt 189 lb 12.8 oz (86.1 kg)   SpO2 97%   BMI 27.23 kg/m , BMI Body mass index is 27.23 kg/m.  Wt Readings from Last 3 Encounters:  09/06/23 189 lb 12.8 oz (86.1 kg)  09/05/22 184 lb (83.5 kg)  10/08/21 188 lb (85.3 kg)    General: NAD HEENT:Normal Neck: Supple, no elevated JVP or carotid bruits, no thyromegaly. Lungs: Clear to auscultation, nonlabored breathing at rest. Cardiac: Regular rate and rhythm, no S3 or significant systolic murmur, no pericardial rub. Abdomen: Soft, nontender, no hepatomegaly, bowel sounds present, no guarding or rebound. Extremities: No pitting edema, distal pulses 2+. Skin: Warm and dry. Musculoskeletal: No kyphosis. Neuropsychiatric: Alert and oriented x3, affect grossly appropriate.  EKG Interpretation Date/Time:  Wednesday September 06 2023 11:23:30 EST Ventricular Rate:  64 PR Interval:  158 QRS Duration:  96 QT Interval:  422 QTC Calculation: 435 R Axis:   50  Text Interpretation: Normal sinus rhythm with sinus arrhythmia Normal ECG When compared with ECG of Sep 05, 2022 No significant change was found Confirmed by Sundai Probert [id number]) on 09/06/2023 11:29:23 AM    Recent Labwork: No results found for requested labs within last 365 days.     Component Value Date/Time   CHOL 139 03/14/2018 1222   TRIG 91  03/14/2018 1222   HDL 75 03/14/2018 1222   CHOLHDL 1.9 03/14/2018 1222   CHOLHDL 3.8 11/20/2015 1001   VLDL 42 (H) 11/20/2015 1001   LDLCALC 46 03/14/2018 1222   Dated 08/28/20: cholesterol 154, triglycerides 68, HDL 88, LDL 53. CMET and TSH normal Dated 06/22/22: cholesterol  154, triglycerides 76, HDL 84, LDL 56, CMET, TSH, CBC normal   Other Studies Reviewed Today:   Echo 09/14/15: Study Conclusions - Left ventricle: The cavity size was mildly dilated. Systolic   function was mildly to moderately reduced. The estimated ejection   fraction was in the range of 40% to 45%. There is akinesis of the   basalinferior myocardium. Features are consistent with a   pseudonormal left ventricular filling pattern, with concomitant  abnormal relaxation and increased filling pressure (grade 2   diastolic dysfunction). - Mitral valve: There was mild regurgitation. - Left atrium: The atrium was moderately to severely dilated. - Tricuspid valve: There was trivial regurgitation. - Pulmonary arteries: PA peak pressure: 31 mm Hg (S).     Left heart cath 09/12/15: Study Conclusions  - Left ventricle: The cavity size was mildly dilated. Systolic   function was mildly to moderately reduced. The estimated ejection   fraction was in the range of 40% to 45%. There is akinesis of the   basalinferior myocardium. Features are consistent with a   pseudonormal left ventricular filling pattern, with concomitant   abnormal relaxation and increased filling pressure (grade 2   diastolic dysfunction). - Mitral valve: There was mild regurgitation. - Left atrium: The atrium was moderately to severely dilated. - Tricuspid valve: There was trivial regurgitation. - Pulmonary arteries: PA peak pressure: 31 mm Hg (S).   Study Highlights    The left ventricular ejection fraction is mildly decreased (45-54%). Nuclear stress EF: 46%. There was no ST segment deviation noted during stress. Findings consistent with  prior myocardial infarction. This is a low risk study.   There is a small defect of mild severity present in the apical inferior and apex location. This defect is fixed, and likely represents prior infarct.    No ischemia on perfusion images  Myoview  10/08/21: Study Highlights      The study is normal. The study is low risk.   Fair exercise capacity, achieved 7.0 METS   Max heart rate 142 bpm (88% max age-predicted heart rate)   Normal BP response to exercise   No ST deviation was noted.   LV perfusion is abnormal. There is no evidence of ischemia. There is no evidence of infarction. Defect 1: There is a medium defect with mild reduction in uptake present in the apical to basal inferior location(s) that is fixed. There is normal wall motion in the defect area. Consistent with artifact caused by diaphragmatic attenuation.   Left ventricular function is normal. End diastolic cavity size is moderately enlarged. End systolic cavity size is moderately enlarged.   Prior study available for comparison from 12/17/2019.   Fixed inferior perfusion defect with normal wall motion suggests artifact Low risk study  Assessment and Plan:  1. Coronary artery disease involving native coronary artery of native heart without angina pectoris   2. Chronic combined systolic and diastolic CHF (congestive heart failure) (HCC)   3. Hyperlipidemia LDL goal <70   4. Essential hypertension     Assessment/plan:  1.  CAD: Myoview  in March 2023 looked good.  Last PCI was in 2017. No symptoms of angina. On ASA, statin, beta blocker. 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  2.  Chronic combined systolic and diastolic heart failure/ischemic cardiomyopathy: last EF improved to 58%. Continue losartan  and Coreg   Euvolemic on physical exam  3. Hypertension: Blood pressure is too high. Will increase Coreg  12.5 mg bid and losartan  to 50 mg daily.   4. Hyperlipidemia: On Lipitor  80  mg daily. Will update lab today  5. Obstructive sleep apnea: off CPAP since he lost 100 lbs.   6. Neuroendocrine tumor s/p resection. No recurrence.   Medication Adjustments/Labs and Tests Ordered: Current medicines are reviewed at length with the patient today.  Concerns regarding medicines are outlined above.    Follow up one year   Signed, Miraj Truss MD, FACC  09/06/2023  11:42 AM    [facility]

## 2023-09-06 ENCOUNTER — Ambulatory Visit: Payer: Managed Care, Other (non HMO) | Attending: Cardiology | Admitting: Cardiology

## 2023-09-06 ENCOUNTER — Encounter: Payer: Self-pay | Admitting: Cardiology

## 2023-09-06 VITALS — BP 168/96 | HR 69 | Ht 70.0 in | Wt 189.8 lb

## 2023-09-06 DIAGNOSIS — I10 Essential (primary) hypertension: Secondary | ICD-10-CM

## 2023-09-06 DIAGNOSIS — I2510 Atherosclerotic heart disease of native coronary artery without angina pectoris: Secondary | ICD-10-CM | POA: Diagnosis not present

## 2023-09-06 DIAGNOSIS — E785 Hyperlipidemia, unspecified: Secondary | ICD-10-CM

## 2023-09-06 DIAGNOSIS — I5042 Chronic combined systolic (congestive) and diastolic (congestive) heart failure: Secondary | ICD-10-CM

## 2023-09-06 MED ORDER — CARVEDILOL 12.5 MG PO TABS: 12.5000 mg | ORAL_TABLET | Freq: Two times a day (BID) | ORAL | 3 refills | Status: DC

## 2023-09-06 MED ORDER — LOSARTAN POTASSIUM 50 MG PO TABS: 50.0000 mg | ORAL_TABLET | Freq: Every day | ORAL | 3 refills | Status: DC

## 2023-09-06 NOTE — Patient Instructions (Signed)
 Medication Instructions:  Increase Losartan  to 50 mg daily Increase Carvedilol  to 12.5 mg twice a day Continue all other medications *If you need a refill on your cardiac medications before your next appointment, please call your pharmacy*   Lab Work: B12,bmet,lipid and hepatic panels today   Testing/Procedures: Lexiscan  Myoview   first available    Follow-Up: At [facility], you and your health needs are our priority.  As part of our continuing mission to provide you with exceptional heart care, we have created designated Provider Care Teams.  These Care Teams include your primary Cardiologist (physician) and Advanced Practice Providers (APPs -  Physician Assistants and Nurse Practitioners) who all work together to provide you with the care you need, when you need it.  We recommend signing up for the patient portal called MyChart.  Sign up information is provided on this After Visit Summary.  MyChart is used to connect with patients for Virtual Visits (Telemedicine).  Patients are able to view lab/test results, encounter notes, upcoming appointments, etc.  Non-urgent messages can be sent to your provider as well.   To learn more about what you can do with MyChart, go to [email or web address].    Your next appointment:  1 year   Call in Oct to schedule Feb appointment     Provider:  Dr.Jordan

## 2023-09-07 LAB — HEPATIC FUNCTION PANEL
ALT: 14 [IU]/L (ref 0–44)
AST: 24 [IU]/L (ref 0–40)
Albumin: 4.4 g/dL (ref 3.9–4.9)
Alkaline Phosphatase: 64 [IU]/L (ref 44–121)
Bilirubin Total: 0.7 mg/dL (ref 0.0–1.2)
Bilirubin, Direct: 0.28 mg/dL (ref 0.00–0.40)
Total Protein: 6.7 g/dL (ref 6.0–8.5)

## 2023-09-07 LAB — LIPID PANEL
Chol/HDL Ratio: 2.1 {ratio} (ref 0.0–5.0)
Cholesterol, Total: 165 mg/dL (ref 100–199)
HDL: 80 mg/dL (ref >39)
LDL Chol Calc (NIH): 68 mg/dL (ref 0–99)
Triglycerides: 95 mg/dL (ref 0–149)
VLDL Cholesterol Cal: 17 mg/dL (ref 5–40)

## 2023-09-07 LAB — BASIC METABOLIC PANEL
BUN/Creatinine Ratio: 12 (ref 10–24)
BUN: 11 mg/dL (ref 8–27)
CO2: 23 mmol/L (ref 20–29)
Calcium: 9.1 mg/dL (ref 8.6–10.2)
Chloride: 105 mmol/L (ref 96–106)
Creatinine, Ser: 0.91 mg/dL (ref 0.76–1.27)
Glucose: 83 mg/dL (ref 70–99)
Potassium: 4.2 mmol/L (ref 3.5–5.2)
Sodium: 142 mmol/L (ref 134–144)
eGFR: 95 mL/min/{1.73_m2} (ref >59)

## 2023-09-07 LAB — VITAMIN B12: Vitamin B-12: 159 pg/mL — ABNORMAL LOW (ref 232–1245)

## 2023-09-17 ENCOUNTER — Other Ambulatory Visit: Payer: Self-pay | Admitting: Cardiology

## 2023-09-19 ENCOUNTER — Other Ambulatory Visit: Payer: Self-pay

## 2023-09-19 ENCOUNTER — Encounter (HOSPITAL_COMMUNITY): Payer: Managed Care, Other (non HMO)

## 2023-09-19 DIAGNOSIS — E785 Hyperlipidemia, unspecified: Secondary | ICD-10-CM

## 2023-09-19 DIAGNOSIS — I2510 Atherosclerotic heart disease of native coronary artery without angina pectoris: Secondary | ICD-10-CM

## 2023-09-19 MED ORDER — ATORVASTATIN CALCIUM 80 MG PO TABS: 80.0000 mg | ORAL_TABLET | Freq: Every day | ORAL | 3 refills | Status: AC

## 2023-09-22 ENCOUNTER — Telehealth (HOSPITAL_COMMUNITY): Payer: Self-pay | Admitting: *Deleted

## 2023-09-22 NOTE — Telephone Encounter (Signed)
Left message on voicemail per DPR in reference to upcoming appointment scheduled on 09/26/23 at 10:30 with detailed instructions given per Myocardial Perfusion Study Information Sheet for the test. LM to arrive 15 minutes early, and that it is imperative to arrive on time for appointment to keep from having the test rescheduled. If you need to cancel or reschedule your appointment, please call the office within 24 hours of your appointment. Failure to do so may result in a cancellation of your appointment, and a $50 no show fee. Phone number given for call back for any questions.

## 2023-09-26 ENCOUNTER — Other Ambulatory Visit: Payer: Self-pay | Admitting: Cardiology

## 2023-09-26 ENCOUNTER — Ambulatory Visit (HOSPITAL_COMMUNITY): Payer: Managed Care, Other (non HMO) | Attending: Cardiology

## 2023-09-26 VITALS — Ht 62.0 in | Wt 189.0 lb

## 2023-09-26 DIAGNOSIS — E785 Hyperlipidemia, unspecified: Secondary | ICD-10-CM | POA: Diagnosis present

## 2023-09-26 DIAGNOSIS — I2510 Atherosclerotic heart disease of native coronary artery without angina pectoris: Secondary | ICD-10-CM

## 2023-09-26 DIAGNOSIS — I5042 Chronic combined systolic (congestive) and diastolic (congestive) heart failure: Secondary | ICD-10-CM | POA: Diagnosis not present

## 2023-09-26 DIAGNOSIS — I10 Essential (primary) hypertension: Secondary | ICD-10-CM

## 2023-09-26 LAB — MYOCARDIAL PERFUSION IMAGING
Estimated workload: 1
Exercise duration (min): 1 min
Exercise duration (sec): 0 s
LV dias vol: 130 mL (ref 62–150)
LV dias vol: 130 mL (ref 62–150)
LV sys vol: 72 mL
LV sys vol: 72 mL
MPHR: 158 {beats}/min
Nuc Stress EF: 45 %
Nuc Stress EF: 45 %
Peak HR: 83 {beats}/min
Peak HR: 75 {beats}/min
Percent HR: 52 %
Rest HR: 46 {beats}/min
Rest HR: 46 {beats}/min
Rest Nuclear Isotope Dose: 10.1 mCi
Rest Nuclear Isotope Dose: 10.3 mCi
SDS: 0
SDS: 0
SRS: 0
SRS: 0
SSS: 0
SSS: 0
ST Depression (mm): 0 mm
Stress Nuclear Isotope Dose: 31.5 mCi
Stress Nuclear Isotope Dose: 32.7 mCi
TID: 1
TID: 1

## 2023-09-26 MED ORDER — TECHNETIUM TC 99M TETROFOSMIN IV KIT
31.5000 | PACK | Freq: Once | INTRAVENOUS | Status: AC | PRN
  Administered 2023-09-26: 31.5 via INTRAVENOUS

## 2023-09-26 MED ORDER — REGADENOSON 0.4 MG/5ML IV SOLN
0.4000 mg | Freq: Once | INTRAVENOUS | Status: AC
  Administered 2023-09-26: 0.4 mg via INTRAVENOUS

## 2023-09-26 MED ORDER — TECHNETIUM TC 99M TETROFOSMIN IV KIT
10.1000 | PACK | Freq: Once | INTRAVENOUS | Status: AC | PRN
  Administered 2023-09-26: 10.1 via INTRAVENOUS

## 2023-10-01 ENCOUNTER — Other Ambulatory Visit: Payer: Self-pay | Admitting: Cardiology

## 2023-10-04 ENCOUNTER — Other Ambulatory Visit: Payer: Self-pay

## 2023-10-04 MED ORDER — CARVEDILOL 12.5 MG PO TABS: 12.5000 mg | ORAL_TABLET | Freq: Two times a day (BID) | ORAL | 3 refills | Status: AC

## 2023-12-19 ENCOUNTER — Ambulatory Visit: Payer: Self-pay | Admitting: Cardiology

## 2023-12-19 LAB — HEPATIC FUNCTION PANEL
ALT: 17 IU/L (ref 0–44)
AST: 26 IU/L (ref 0–40)
Albumin: 4.4 g/dL (ref 3.9–4.9)
Alkaline Phosphatase: 72 IU/L (ref 44–121)
Bilirubin Total: 0.5 mg/dL (ref 0.0–1.2)
Bilirubin, Direct: 0.2 mg/dL (ref 0.00–0.40)
Total Protein: 6.3 g/dL (ref 6.0–8.5)

## 2023-12-19 LAB — LIPID PANEL
Chol/HDL Ratio: 1.8 ratio (ref 0.0–5.0)
Cholesterol, Total: 135 mg/dL (ref 100–199)
HDL: 73 mg/dL (ref >39)
LDL Chol Calc (NIH): 44 mg/dL (ref 0–99)
Triglycerides: 101 mg/dL (ref 0–149)
VLDL Cholesterol Cal: 18 mg/dL (ref 5–40)

## 2024-03-18 ENCOUNTER — Telehealth: Payer: Self-pay

## 2024-03-18 NOTE — Telephone Encounter (Signed)
   Pre-operative Risk Assessment    Patient Name: James Mullins  DOB: 1961-07-23 MRN: [id number]   Date of last office visit: 09/06/23 PETER SWAZILAND, MD Date of next office visit: NONE   Request for Surgical Clearance    Procedure:  LEFT TOTAL KNEE ARTHROPLASTY  Date of Surgery:  Clearance 05/07/24                                Surgeon:  DR DEMPSEY MOAN Surgeon's Group or Practice Name:  [facility] Phone number:  [phone] Fax number:  [phone]  ATTN: KIRKE DUNN   Type of Clearance Requested:   - Medical  - Pharmacy:  Hold Aspirin      Type of Anesthesia:  CHOICE   Additional requests/questions:    SignedRucker, Pridgeon   03/18/2024, 11:27 AM

## 2024-03-19 NOTE — Telephone Encounter (Signed)
 Left message to call back to schedule tele pre op appt.

## 2024-03-19 NOTE — Telephone Encounter (Signed)
   Name: James Mullins  DOB: 1961-02-01  MRN: [id number]  Primary Cardiologist: Peter Swaziland, MD   Preoperative team, please contact this patient and set up a phone call appointment for further preoperative risk assessment. Please obtain consent and complete medication review. Thank you for your help.  I confirm that guidance regarding antiplatelet and oral anticoagulation therapy has been completed and, if necessary, noted below.  He may hold aspirin  for 5-7 days prior to procedure. Please resume aspirin  as soon as possible postprocedure, at the discretion of the surgeon.    I also confirmed the patient resides in the state of [place] . As per [facility] Medical [employer] telemedicine laws, the patient must reside in the state in which the provider is licensed.   Lum LITTIE Louis, NP 03/19/2024, 4:02 PM [facility]

## 2024-03-20 ENCOUNTER — Telehealth: Payer: Self-pay

## 2024-03-20 NOTE — Telephone Encounter (Signed)
 Pt returning call from 8/18 regarding Pre Op Tele Visit. Please advise

## 2024-03-20 NOTE — Telephone Encounter (Signed)
 When speaking with the pt, pt stated that the procedure was moved to November 10th, 2025. Scheduled pt TELE Preop appt 05/02/24. Med Rec and Consent done.

## 2024-03-20 NOTE — Telephone Encounter (Signed)
 Med Rec and Consent done.    Patient Consent for Virtual Visit        James Mullins has provided verbal consent on 03/20/2024 for a virtual visit (video or telephone).   CONSENT FOR VIRTUAL VISIT FOR:  James Mullins  By participating in this virtual visit I agree to the following:  I hereby voluntarily request, consent and authorize [facility] and its employed or contracted physicians, physician assistants, nurse practitioners or other licensed health care professionals (the Practitioner), to provide me with telemedicine health care services (the "Services) as deemed necessary by the treating Practitioner. I acknowledge and consent to receive the Services by the Practitioner via telemedicine. I understand that the telemedicine visit will involve communicating with the Practitioner through live audiovisual communication technology and the disclosure of certain medical information by electronic transmission. I acknowledge that I have been given the opportunity to request an in-person assessment or other available alternative prior to the telemedicine visit and am voluntarily participating in the telemedicine visit.  I understand that I have the right to withhold or withdraw my consent to the use of telemedicine in the course of my care at any time, without affecting my right to future care or treatment, and that the Practitioner or I may terminate the telemedicine visit at any time. I understand that I have the right to inspect all information obtained and/or recorded in the course of the telemedicine visit and may receive copies of available information for a reasonable fee.  I understand that some of the potential risks of receiving the Services via telemedicine include:  Delay or interruption in medical evaluation due to technological equipment failure or disruption; Information transmitted may not be sufficient (e.g. poor resolution of images) to allow for appropriate medical  decision making by the Practitioner; and/or  In rare instances, security protocols could fail, causing a breach of personal health information.  Furthermore, I acknowledge that it is my responsibility to provide information about my medical history, conditions and care that is complete and accurate to the best of my ability. I acknowledge that Practitioner's advice, recommendations, and/or decision may be based on factors not within their control, such as incomplete or inaccurate data provided by me or distortions of diagnostic images or specimens that may result from electronic transmissions. I understand that the practice of medicine is not an exact science and that Practitioner makes no warranties or guarantees regarding treatment outcomes. I acknowledge that a copy of this consent can be made available to me via my patient portal [facility] MyChart), or I can request a printed copy by calling the office of [facility].    I understand that my insurance will be billed for this visit.   I have read or had this consent read to me. I understand the contents of this consent, which adequately explains the benefits and risks of the Services being provided via telemedicine.  I have been provided ample opportunity to ask questions regarding this consent and the Services and have had my questions answered to my satisfaction. I give my informed consent for the services to be provided through the use of telemedicine in my medical care

## 2024-05-02 ENCOUNTER — Ambulatory Visit: Attending: Cardiology

## 2024-05-02 DIAGNOSIS — Z01810 Encounter for preprocedural cardiovascular examination: Secondary | ICD-10-CM | POA: Diagnosis not present

## 2024-05-02 NOTE — Progress Notes (Signed)
 Virtual Visit via Telephone Note   Because of Haward Pope Golson co-morbid illnesses, he is at least at moderate risk for complications without adequate follow up.  This format is felt to be most appropriate for this patient at this time.  Due to technical limitations with video connection Web designer), today's appointment will be conducted as an audio only telehealth visit, and James Mullins verbally agreed to proceed in this manner.   All issues noted in this document were discussed and addressed.  No physical exam could be performed with this format.  Evaluation Performed:  Preoperative cardiovascular risk assessment _____________   Date:  05/02/2024   Patient ID:  James Mullins, DOB 12/08/1960, MRN [id number] Patient Location:  Home Provider location:   Office  Primary Care Provider:  Douglass Ivanoff, MD Primary Cardiologist:  Peter Swaziland, MD  Chief Complaint / Patient Profile   63 y.o. y/o male with a h/o CAD s/p inferior STEMI 2017 with DES to mid RCA, ICM with ref, HTN, HLD, OSA (on CPAP) who is pending left total knee arthroplasty and presents today for telephonic preoperative cardiovascular risk assessment.  History of Present Illness    James Mullins is a 63 y.o. male who presents via audio/video conferencing for a telehealth visit today.  Pt was last seen in cardiology clinic on 09/06/2023 by Dr. Swaziland.  At that time James Mullins was doing well with elevated BP and carvedilol  was increased to 12.5 mg as well as losartan  to 50 mg.  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  The patient is now pending procedure as outlined above. Since his last visit, he has been doing well with no new cardiac complaints.  He reports that his blood pressures have improved and most recent blood pressure was 118/78 taken at his oncologist office.  He denies chest pain, shortness of breath, lower extremity edema, fatigue, palpitations, melena, hematuria, hemoptysis,  diaphoresis, weakness, presyncope, syncope, orthopnea, and PND.    Past Medical History    Past Medical History:  Diagnosis Date   CAD (coronary artery disease)    a. 09/2015 Inf STEMI/PCI: LM nl, LAD nl, RI nl, LCX nl, RCA 76m (3.5 x 16 Promus Premier MR DES).   Chronic combined systolic and diastolic CHF (congestive heart failure) (HCC) 09/2015   a. 09/2009 EF 35-45% by LV gram @ time of MI;  b. 09/2015 Echo: EF 40-45%, Gr2 DD, mild MR, mod-sev dil LA, triv TR, PASP .   Hyperlipidemia    Iron deficiency anemia 09/2015   Ischemic cardiomyopathy    a. 09/2015 Echo: EF 40-45%   MI (myocardial infarction) (HCC) 2//112017   Obesity (BMI 30.0-34.9) 2015   OSA on CPAP 08/2013   snoring. sleep study confirmed.  Dr Dedra Dohmeier, pt does not knoe cpap settings   Upper GI bleed 10/29/2015   a. 09/2015 EGD: multiple subepithelial gastric nodules with overlying multiple, oozing ulcers.  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 Now followed @ [facility] wth plan for repeat EGD/colonoscopy.   Past Surgical History:  Procedure Laterality Date   CARDIAC CATHETERIZATION N/A 09/12/2015   Left Heart Cath and Coronary Angiography; Dr Peter M Swaziland, MD;    CARDIAC CATHETERIZATION N/A 09/12/2015   placement Promus stent to mid RCA, Dr Peter M Swaziland, MD   ESOPHAGOGASTRODUODENOSCOPY N/A 10/29/2015   Procedure: ESOPHAGOGASTRODUODENOSCOPY (EGD);  Surgeon: Gustav Shila GAILS, MD;  Location: [facility];  Service: Endoscopy;  Laterality: N/A;   EUS N/A  11/05/2015   Procedure: FULL UPPER ENDOSCOPIC ULTRASOUND (EUS) RADIAL;  Surgeon: Toribio SHAUNNA Cedar, MD;  Location: [facility];  Service: Endoscopy;  Laterality: N/A;    Allergies  No Known Allergies  Home Medications    Prior to Admission medications   Medication Sig Start Date End Date Taking? Authorizing Provider  aspirin  EC 81 MG tablet Take 81 mg by mouth daily.    [provider]  atorvastatin  (LIPITOR ) 80 MG tablet Take 1 tablet (80 mg total) by mouth  daily. 09/19/23   Swaziland, Peter M, MD  carvedilol  (COREG ) 12.5 MG tablet Take 1 tablet (12.5 mg total) by mouth 2 (two) times daily. 10/04/23   Swaziland, Peter M, MD  cyanocobalamin (,VITAMIN B-12,) 1000 MCG/ML injection Inject into the muscle.    [provider]  ferrous sulfate  325 (65 FE) MG EC tablet Take 325 mg by mouth daily with breakfast. 1 Tablet Daily    [provider]  losartan  (COZAAR ) 50 MG tablet Take 1 tablet (50 mg total) by mouth daily. 09/06/23 03/20/24  Swaziland, Peter M, MD  Multiple Vitamins-Minerals (MULTIVITAMIN WITH MINERALS) tablet Take 1 tablet by mouth daily.    [provider]  nitroGLYCERIN  (NITROSTAT ) 0.4 MG SL tablet Place 1 tablet (0.4 mg total) under the tongue every 5 (five) minutes as needed for chest pain (CP or SOB). 09/05/22   Swaziland, Peter M, MD    Physical Exam    Vital Signs:  James Mullins does not have vital signs available for review today.  118/78  Given telephonic nature of communication, physical exam is limited. AAOx3. NAD. Normal affect.  Speech and respirations are unlabored.  Accessory Clinical Findings    None  Assessment & Plan    1.  Preoperative Cardiovascular Risk Assessment: - Patient's RCRI score is 11%  The patient affirms he has been doing well without any new cardiac symptoms. They are able to achieve 7 METS without cardiac limitations. Therefore, based on ACC/AHA guidelines, the patient would be at acceptable risk for the planned procedure without further cardiovascular testing. The patient was advised that if he develops new symptoms prior to surgery to contact our office to arrange for a follow-up visit, and he verbalized understanding.   He may hold aspirin  for 5-7 days prior to procedure. Please resume aspirin  as soon as possible postprocedure, at the discretion of the surgeon.   The patient was advised that if he develops new symptoms prior to surgery to contact our office to arrange for a follow-up  visit, and he verbalized understanding.   A copy of this note will be routed to requesting surgeon.  Time:   Today, I have spent 6 minutes with the patient with telehealth technology discussing medical history, symptoms, and management plan.     Wyn Raddle, Jackee Shove, NP  05/02/2024, 7:35 AM

## 2024-05-14 NOTE — H&P (Addendum)
 TOTAL KNEE ADMISSION H&P  Patient is being admitted for left total knee arthroplasty.  Subjective:  Chief Complaint: Left knee pain.  HPI: James Mullins, 63 y.o. male has a history of pain and functional disability in the left knee due to arthritis and has failed non-surgical conservative treatments for greater than 12 weeks to include NSAID's and/or analgesics, corticosteriod injections, and activity modification. Onset of symptoms was gradual, starting several years ago with gradually worsening course since that time. The patient noted no past surgery on the left knee.  Patient currently rates pain in the left knee at 9 out of 10 with activity. Patient has night pain, worsening of pain with activity and weight bearing, pain with passive range of motion, and crepitus. Patient has evidence of advanced bone-on-bone arthritis in the medial and patellofemoral compartments. The lateral view also shows bone-on-bone changes behind the patella by imaging studies. There is no active infection.  Patient Active Problem List   Diagnosis Date Noted   Gastric cancer (HCC) 06/10/2016   CAD (coronary artery disease), native coronary artery    Acute gouty arthritis    Acute upper GI bleed    Melena    Acute blood loss anemia 10/30/2015   Upper GI bleed 10/30/2015   Gastric mass    Bleeding gastrointestinal    GI bleed 10/29/2015   Cough 10/29/2015   Chronic combined systolic and diastolic CHF (congestive heart failure) (HCC)    Anemia 09/21/2015   ST elevation (STEMI) myocardial infarction involving right coronary artery (HCC) 09/12/2015   OSA (obstructive sleep apnea) 09/12/2015   Snoring 08/05/2013   Obesity (BMI 30.0-34.9)     Past Medical History:  Diagnosis Date   CAD (coronary artery disease)    a. 09/2015 Inf STEMI/PCI: LM nl, LAD nl, RI nl, LCX nl, RCA 53m (3.5 x 16 Promus Premier MR DES).   Chronic combined systolic and diastolic CHF (congestive heart failure) (HCC) 09/2015   a. 09/2009  EF 35-45% by LV gram @ time of MI;  b. 09/2015 Echo: EF 40-45%, Gr2 DD, mild MR, mod-sev dil LA, triv TR, PASP .   Hyperlipidemia    Iron deficiency anemia 09/2015   Ischemic cardiomyopathy    a. 09/2015 Echo: EF 40-45%   MI (myocardial infarction) (HCC) 2//112017   Obesity (BMI 30.0-34.9) 2015   OSA on CPAP 08/2013   snoring. sleep study confirmed.  Dr Dedra Dohmeier, pt does not knoe cpap settings   Upper GI bleed 10/29/2015   a. 09/2015 EGD: multiple subepithelial gastric nodules with overlying multiple, oozing ulcers.  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 Now followed @ [facility] wth plan for repeat EGD/colonoscopy.    Past Surgical History:  Procedure Laterality Date   CARDIAC CATHETERIZATION N/A 09/12/2015   Left Heart Cath and Coronary Angiography; Dr Peter M Jordan, MD;    CARDIAC CATHETERIZATION N/A 09/12/2015   placement Promus stent to mid RCA, Dr Peter M Jordan, MD   ESOPHAGOGASTRODUODENOSCOPY N/A 10/29/2015   Procedure: ESOPHAGOGASTRODUODENOSCOPY (EGD);  Surgeon: Gustav Shila GAILS, MD;  Location: [facility];  Service: Endoscopy;  Laterality: N/A;   EUS N/A 11/05/2015   Procedure: FULL UPPER ENDOSCOPIC ULTRASOUND (EUS) RADIAL;  Surgeon: Toribio SHAUNNA Cedar, MD;  Location: [facility];  Service: Endoscopy;  Laterality: N/A;    Prior to Admission medications   Medication Sig Start Date End Date Taking? Authorizing Provider  aspirin  EC 81 MG tablet Take 81 mg by mouth daily.    [provider]  atorvastatin  (LIPITOR )  80 MG tablet Take 1 tablet (80 mg total) by mouth daily. 09/19/23   Jordan, Peter M, MD  carvedilol  (COREG ) 12.5 MG tablet Take 1 tablet (12.5 mg total) by mouth 2 (two) times daily. 10/04/23   Jordan, Peter M, MD  cyanocobalamin (,VITAMIN B-12,) 1000 MCG/ML injection Inject into the muscle.    [provider]  ferrous sulfate  325 (65 FE) MG EC tablet Take 325 mg by mouth daily with breakfast. 1 Tablet Daily    [provider]  losartan  (COZAAR ) 50 MG  tablet Take 1 tablet (50 mg total) by mouth daily. 09/06/23 03/20/24  Jordan, Peter M, MD  Multiple Vitamins-Minerals (MULTIVITAMIN WITH MINERALS) tablet Take 1 tablet by mouth daily.    [provider]  nitroGLYCERIN  (NITROSTAT ) 0.4 MG SL tablet Place 1 tablet (0.4 mg total) under the tongue every 5 (five) minutes as needed for chest pain (CP or SOB). 09/05/22   Jordan, Peter M, MD    No Known Allergies  Social History   Socioeconomic History   Marital status: Married    Spouse name: renee   Number of children: 1   Years of education: college   Highest education level: Not on file  Occupational History   Occupation: truck driver    Comment: for [employer], flies and transports cars from all over the [place] .   Tobacco Use   Smoking status: Never   Smokeless tobacco: Current    Types: Chew  Substance and Sexual Activity   Alcohol use: Yes    Comment: couple of 6 packs on the weekends   Drug use: No   Sexual activity: Not on file  Other Topics Concern   Not on file  Social History Narrative   Not on file   Social Drivers of Health   Financial Resource Strain: Not on file  Food Insecurity: Not on file  Transportation Needs: Not on file  Physical Activity: Not on file  Stress: Not on file  Social Connections: Not on file  Intimate Partner Violence: Not on file    Tobacco Use: High Risk (05/02/2024)   Patient History    Smoking Tobacco Use: Never    Smokeless Tobacco Use: Current    Passive Exposure: Not on file   Social History   Substance and Sexual Activity  Alcohol Use Yes   Comment: couple of 6 packs on the weekends    Family History  Problem Relation Age of Onset   Colon cancer Mother    Stroke Father     Review of Systems  Constitutional:  Negative for chills and fever.  HENT:  Negative for congestion, sore throat and tinnitus.   Eyes:  Negative for double vision, photophobia and pain.  Respiratory:  Negative for cough, shortness of breath and wheezing.    Cardiovascular:  Negative for chest pain, palpitations and orthopnea.  Gastrointestinal:  Negative for heartburn, nausea and vomiting.  Genitourinary:  Negative for dysuria, frequency and urgency.  Musculoskeletal:  Positive for joint pain.  Neurological:  Negative for dizziness, weakness and headaches.    Objective:  Physical Exam: Well nourished and well developed.  General: Alert and oriented x3, cooperative and pleasant, no acute distress.  Head: normocephalic, atraumatic, neck supple.  Eyes: EOMI.  Musculoskeletal:  Left knee: Slight varus alignment, range of motion 0-125, slight effusion, Baker's cyst present, tenderness along the medial joint line, no lateral tenderness or instability.  Calves soft and nontender. Motor function intact in LE. Strength 5/5 LE bilaterally. Neuro: Distal  pulses 2+. Sensation to light touch intact in LE.  Imaging Review Plain radiographs demonstrate severe degenerative joint disease of the left knee. The overall alignment is neutral. The bone quality appears to be adequate for age and reported activity level.  Assessment/Plan:  End stage arthritis, left knee   The patient history, physical examination, clinical judgment of the provider and imaging studies are consistent with end stage degenerative joint disease of the left knee and total knee arthroplasty is deemed medically necessary. The treatment options including medical management, injection therapy arthroscopy and arthroplasty were discussed at length. The risks and benefits of total knee arthroplasty were presented and reviewed. The risks due to aseptic loosening, infection, stiffness, patella tracking problems, thromboembolic complications and other imponderables were discussed. The patient acknowledged the explanation, agreed to proceed with the plan and consent was signed. Patient is being admitted for inpatient treatment for surgery, pain control, PT, OT, prophylactic antibiotics, VTE  prophylaxis, progressive ambulation and ADLs and discharge planning. The patient is planning to be discharged home.   Patient's anticipated LOS is less than 2 midnights, meeting these requirements: - Younger than 23 - Lives within 1 hour of care - Has a competent adult at home to recover with post-op recover - NO history of  - Chronic pain requiring opioids  - Diabetes  - Heart failure  - Stroke  - DVT/VTE  - Cardiac arrhythmia  - Respiratory Failure/COPD  - Renal failure  - Anemia  - Advanced Liver disease  Therapy Plans: Outpatient therapy at [facility] Disposition: Home with wife Planned DVT Prophylaxis: Xarelto 10 mg QD DME Needed: Vannie PCP: Niels Roys, MD (clearance received) Cardiologist: Peter Jordan, MD (clearance received) TXA: IV Allergies: NKDA Metal Allergy: None Anesthesia Concerns: None BMI: 27.6 Last HgbA1c: Not diabetic Pain Regimen: Oxycodone, tramadol, gabapentin [facility]acy: [facility]  Other: - Stopping aspirin  5-7 days prior per cards  - Patient was instructed on what medications to stop prior to surgery. - Follow-up visit in 2 weeks with Dr. Melodi - Begin physical therapy following surgery - Pre-operative lab work as pre-surgical testing - Prescriptions will be provided in hospital at time of discharge  Roxie Mess, PA-C Orthopedic Surgery [facility] [place]

## 2024-05-27 NOTE — Patient Instructions (Signed)
 SURGICAL WAITING ROOM VISITATION Patients having surgery or a procedure may have no more than 2 support people in the waiting area - these visitors may rotate in the visitor waiting room.   Due to an increase in RSV and influenza rates and associated hospitalizations, children ages 1 and under may not visit patients in [facility] hospitals. If the patient needs to stay at the hospital during part of their recovery, the visitor guidelines for inpatient rooms apply.  PRE-OP VISITATION  Pre-op nurse will coordinate an appropriate time for 1 support person to accompany the patient in pre-op.  This support person may not rotate.  This visitor will be contacted when the time is appropriate for the visitor to come back in the pre-op area.  Please refer to the [facility] website for the visitor guidelines for Inpatients (after your surgery is over and you are in a regular room).  You are not required to quarantine at this time prior to your surgery. However, you must do this: Hand Hygiene often Do NOT share personal items Notify your provider if you are in close contact with someone who has COVID or you develop fever 100.4 or greater, new onset of sneezing, cough, sore throat, shortness of breath or body aches.  If you test positive for Covid or have been in contact with anyone that has tested positive in the last 10 days please notify you surgeon.    Your procedure is scheduled on:  06/10/24  Report to [facility] Main Entrance: [place] entrance where the [employer] is available.   Report to admitting at: 5:50 AM  Call this number if you have any questions or problems the morning of surgery [phone]  FOLLOW ANY ADDITIONAL PRE OP INSTRUCTIONS YOU RECEIVED FROM YOUR SURGEON'S OFFICE!!!  Do not eat food after Midnight the night prior to your surgery/procedure.  After Midnight you may have the following liquids until: 5:20 AM DAY OF SURGERY  Clear Liquid Diet Water Black  Coffee (sugar ok, NO MILK/CREAM OR CREAMERS)  Tea (sugar ok, NO MILK/CREAM OR CREAMERS) regular and decaf                             Plain Jell-O  with no fruit (NO RED)                                           Fruit ices (not with fruit pulp, NO RED)                                     Popsicles (NO RED)                                                                  Juice: NO CITRUS JUICES: only apple, WHITE grape, WHITE cranberry Sports drinks like Gatorade or Powerade (NO RED)   The day of surgery:  Drink ONE (1) Pre-Surgery Clear Ensure at : 5:20 AM the morning of surgery. Drink in one sitting. Do not sip.  This drink was given  to you during your hospital pre-op appointment visit. Nothing else to drink after completing the Pre-Surgery Clear Ensure or G2 : No candy, chewing gum or throat lozenges.    Oral Hygiene is also important to reduce your risk of infection.        Remember - BRUSH YOUR TEETH THE MORNING OF SURGERY WITH YOUR REGULAR TOOTHPASTE  Do NOT smoke after Midnight the night before surgery.  STOP TAKING all Vitamins, Herbs and supplements 1 week before your surgery.   Take ONLY these medicines the morning of surgery with A SIP OF WATER: carvedilol .  If You have been diagnosed with Sleep Apnea - Bring CPAP mask and tubing day of surgery. We will provide you with a CPAP machine on the day of your surgery.                   You may not have any metal on your body including hair pins, jewelry, and body piercing  Do not wear lotions, powders, perfumes / cologne, or deodorant  Men may shave face and neck.  Contacts, Hearing Aids, dentures or bridgework may not be worn into surgery. DENTURES WILL BE REMOVED PRIOR TO SURGERY PLEASE DO NOT APPLY Poly grip OR ADHESIVES!!!  You may bring a small overnight bag with you on the day of surgery, only pack items that are not valuable. [facility] IS NOT RESPONSIBLE   FOR VALUABLES THAT ARE LOST OR STOLEN.   Patients discharged  on the day of surgery will not be allowed to drive home.  Someone NEEDS to stay with you for the first 24 hours after anesthesia.  Do not bring your home medications to the hospital. The Pharmacy will dispense medications listed on your medication list to you during your admission in the Hospital.  Special Instructions: Bring a copy of your healthcare power of attorney and living will documents the day of surgery, if you wish to have them scanned into your [facility] Records- EPIC  Please read over the following fact sheets you were given: IF YOU HAVE QUESTIONS ABOUT YOUR PRE-OP INSTRUCTIONS, PLEASE CALL [phone]  PATIENT SIGNATURE_________________________________  NURSE SIGNATURE__________________________________  ________________________________________________________________________  Pre-operative 4 CHG Bath Instructions  DYNA-Hex 4 Chlorhexidine Gluconate 4% Solution Antiseptic 4 fl. oz   You can play a key role in reducing the risk of infection after surgery. Your skin needs to be as free of germs as possible. You can reduce the number of germs on your skin by washing with CHG (chlorhexidine gluconate) soap before surgery. CHG is an antiseptic soap that kills germs and continues to kill germs even after washing.   DO NOT use if you have an allergy to chlorhexidine/CHG or antibacterial soaps. If your skin becomes reddened or irritated, stop using the CHG and notify one of our RNs at   Please shower with the CHG soap starting 4 days before surgery using the following schedule:     Please keep in mind the following:  DO NOT shave, including legs and underarms, starting the day of your first shower.   You may shave your face at any point before/day of surgery.  Place clean sheets on your bed the day you start using CHG soap. Use a clean washcloth (not used since being washed) for each shower. DO NOT sleep with pets once you start using the CHG.  CHG Shower Instructions:   If you choose to wash your hair and private area, wash first with your normal shampoo/soap.  After you  use shampoo/soap, rinse your hair and body thoroughly to remove shampoo/soap residue.  Turn the water OFF and apply about 3 tablespoons (45 ml) of CHG soap to a CLEAN washcloth.  Apply CHG soap ONLY FROM YOUR NECK DOWN TO YOUR TOES (washing for 3-5 minutes)  DO NOT use CHG soap on face, private areas, open wounds, or sores.  Pay special attention to the area where your surgery is being performed.  If you are having back surgery, having someone wash your back for you may be helpful. Wait 2 minutes after CHG soap is applied, then you may rinse off the CHG soap.  Pat dry with a clean towel  Put on clean clothes/pajamas   If you choose to wear lotion, please use ONLY the CHG-compatible lotions on the back of this paper.     Additional instructions for the day of surgery: DO NOT APPLY any lotions, deodorants, cologne, or perfumes.   Put on clean/comfortable clothes.  Brush your teeth.  Ask your nurse before applying any prescription medications to the skin.   CHG Compatible Lotions   Aveeno Moisturizing lotion  Cetaphil Moisturizing Cream  Cetaphil Moisturizing Lotion  Clairol Herbal Essence Moisturizing Lotion, Dry Skin  Clairol Herbal Essence Moisturizing Lotion, Extra Dry Skin  Clairol Herbal Essence Moisturizing Lotion, Normal Skin  Curel Age Defying Therapeutic Moisturizing Lotion with Alpha Hydroxy  Curel Extreme Care Body Lotion  Curel Soothing Hands Moisturizing Hand Lotion  Curel Therapeutic Moisturizing Cream, Fragrance-Free  Curel Therapeutic Moisturizing Lotion, Fragrance-Free  Curel Therapeutic Moisturizing Lotion, Original Formula  Eucerin Daily Replenishing Lotion  Eucerin Dry Skin Therapy Plus Alpha Hydroxy Crme  Eucerin Dry Skin Therapy Plus Alpha Hydroxy Lotion  Eucerin Original Crme  Eucerin Original Lotion  Eucerin Plus Crme Eucerin Plus Lotion  Eucerin  TriLipid Replenishing Lotion  Keri Anti-Bacterial Hand Lotion  Keri Deep Conditioning Original Lotion Dry Skin Formula Softly Scented  Keri Deep Conditioning Original Lotion, Fragrance Free Sensitive Skin Formula  Keri Lotion Fast Absorbing Fragrance Free Sensitive Skin Formula  Keri Lotion Fast Absorbing Softly Scented Dry Skin Formula  Keri Original Lotion  Keri Skin Renewal Lotion Keri Silky Smooth Lotion  Keri Silky Smooth Sensitive Skin Lotion  Nivea Body Creamy Conditioning Oil  Nivea Body Extra Enriched Lotion  Nivea Body Original Lotion  Nivea Body Sheer Moisturizing Lotion Nivea Crme  Nivea Skin Firming Lotion  NutraDerm 30 Skin Lotion  NutraDerm Skin Lotion  NutraDerm Therapeutic Skin Cream  NutraDerm Therapeutic Skin Lotion  ProShield Protective Hand Cream  Provon moisturizing lotion  Incentive Spirometer  An incentive spirometer is a tool that can help keep your lungs clear and active. This tool measures how well you are filling your lungs with each breath. Taking long deep breaths may help reverse or decrease the chance of developing breathing (pulmonary) problems (especially infection) following: A long period of time when you are unable to move or be active. BEFORE THE PROCEDURE  If the spirometer includes an indicator to show your best effort, your nurse or respiratory therapist will set it to a desired goal. If possible, sit up straight or lean slightly forward. Try not to slouch. Hold the incentive spirometer in an upright position. INSTRUCTIONS FOR USE  Sit on the edge of your bed if possible, or sit up as far as you can in bed or on a chair. Hold the incentive spirometer in an upright position. Breathe out normally. Place the mouthpiece in your mouth and seal your lips tightly around it. Breathe  in slowly and as deeply as possible, raising the piston or the ball toward the top of the column. Hold your breath for 3-5 seconds or for as long as possible. Allow the  piston or ball to fall to the bottom of the column. Remove the mouthpiece from your mouth and breathe out normally. Rest for a few seconds and repeat Steps 1 through 7 at least 10 times every 1-2 hours when you are awake. Take your time and take a few normal breaths between deep breaths. The spirometer may include an indicator to show your best effort. Use the indicator as a goal to work toward during each repetition. After each set of 10 deep breaths, practice coughing to be sure your lungs are clear. If you have an incision (the cut made at the time of surgery), support your incision when coughing by placing a pillow or rolled up towels firmly against it. Once you are able to get out of bed, walk around indoors and cough well. You may stop using the incentive spirometer when instructed by your caregiver.  RISKS AND COMPLICATIONS Take your time so you do not get dizzy or light-headed. If you are in pain, you may need to take or ask for pain medication before doing incentive spirometry. It is harder to take a deep breath if you are having pain. AFTER USE Rest and breathe slowly and easily. It can be helpful to keep track of a log of your progress. Your caregiver can provide you with a simple table to help with this. If you are using the spirometer at home, follow these instructions: SEEK MEDICAL CARE IF:  You are having difficultly using the spirometer. You have trouble using the spirometer as often as instructed. Your pain medication is not giving enough relief while using the spirometer. You develop fever of 100.5 F (38.1 C) or higher. SEEK IMMEDIATE MEDICAL CARE IF:  You cough up bloody sputum that had not been present before. You develop fever of 102 F (38.9 C) or greater. You develop worsening pain at or near the incision site. MAKE SURE YOU:  Understand these instructions. Will watch your condition. Will get help right away if you are not doing well or get worse. Document  Released: 11/28/2006 Document Revised: 10/10/2011 Document Reviewed: 01/29/2007 [facility] Patient Information 2014 [place], [place].   ________________________________________________________________________

## 2024-05-28 ENCOUNTER — Other Ambulatory Visit: Payer: Self-pay

## 2024-05-28 ENCOUNTER — Encounter (HOSPITAL_COMMUNITY)
Admission: RE | Admit: 2024-05-28 | Discharge: 2024-05-28 | Disposition: A | Discharge status: Home / Self Care | Source: Ambulatory Visit | Attending: Orthopedic Surgery | Admitting: Orthopedic Surgery

## 2024-05-28 ENCOUNTER — Encounter (HOSPITAL_COMMUNITY): Payer: Self-pay

## 2024-05-28 VITALS — BP 147/92 | HR 67 | Temp 99.1°F | Ht 70.0 in | Wt 184.0 lb

## 2024-05-28 DIAGNOSIS — Z01812 Encounter for preprocedural laboratory examination: Secondary | ICD-10-CM | POA: Insufficient documentation

## 2024-05-28 DIAGNOSIS — Z01818 Encounter for other preprocedural examination: Secondary | ICD-10-CM

## 2024-05-28 DIAGNOSIS — I2510 Atherosclerotic heart disease of native coronary artery without angina pectoris: Secondary | ICD-10-CM | POA: Diagnosis not present

## 2024-05-28 DIAGNOSIS — I5042 Chronic combined systolic (congestive) and diastolic (congestive) heart failure: Secondary | ICD-10-CM | POA: Diagnosis not present

## 2024-05-28 DIAGNOSIS — M1712 Unilateral primary osteoarthritis, left knee: Secondary | ICD-10-CM | POA: Diagnosis not present

## 2024-05-28 DIAGNOSIS — Z955 Presence of coronary angioplasty implant and graft: Secondary | ICD-10-CM | POA: Insufficient documentation

## 2024-05-28 DIAGNOSIS — R911 Solitary pulmonary nodule: Secondary | ICD-10-CM | POA: Insufficient documentation

## 2024-05-28 DIAGNOSIS — I252 Old myocardial infarction: Secondary | ICD-10-CM | POA: Diagnosis not present

## 2024-05-28 DIAGNOSIS — G4733 Obstructive sleep apnea (adult) (pediatric): Secondary | ICD-10-CM | POA: Diagnosis not present

## 2024-05-28 HISTORY — DX: Unspecified osteoarthritis, unspecified site: M19.90

## 2024-05-28 LAB — SURGICAL PCR SCREEN
MRSA, PCR: POSITIVE — AB
Staphylococcus aureus: POSITIVE — AB

## 2024-05-28 NOTE — Progress Notes (Signed)
 Lab. Results: PCR: + MRSA/+ STAPH

## 2024-05-28 NOTE — Progress Notes (Signed)
 For Anesthesia: PCP - Douglass Ivanoff, MD  Cardiologist - Jordan, Peter M, MD LOV: 09/06/23 Clearance:Dick, Jackee VEAR Raddle., NP : 05/02/24 Bowel Prep reminder:  Chest x-ray -  EKG - 09/06/23.  Stress Test -  ECHO - 09/14/15 Cardiac Cath - 09/12/15 Gated spectrum: 09/26/23 Pacemaker/ICD device last checked: Pacemaker orders received: Device Rep notified:  Spinal Cord Stimulator:N/A  Sleep Study - Yes CPAP - NO  Fasting Blood Sugar - N/A Checks Blood Sugar _____ times a day Date and result of last Hgb A1c-  Last dose of GLP1 agonist- N/A GLP1 instructions: Hold 7 days prior to schedule (Hold 24 hours-daily)   Last dose of SGLT-2 inhibitors- N/A SGLT-2 instructions: Hold 72 hours prior to surgery  Blood Thinner Instructions:N/A Last Dose: Time last taken:  Aspirin  Instructions: Will be hold 5 days before surgery. Last Dose: Time last taken:  Activity level: Can go up a flight of stairs and activities of daily living without stopping and without chest pain and/or shortness of breath   Able to exercise without chest pain and/or shortness of breath  Anesthesia review: Hx: CHF,CAD,MI,OSA(NO CPAP).  Patient denies shortness of breath, fever, cough and chest pain at PAT appointment   Patient verbalized understanding of instructions that were reviewed over the telephone.

## 2024-05-29 ENCOUNTER — Encounter (HOSPITAL_COMMUNITY): Payer: Self-pay

## 2024-05-29 NOTE — Anesthesia Preprocedure Evaluation (Addendum)
 Anesthesia Evaluation  Patient identified by MRN, date of birth, ID band Patient awake    Reviewed: Allergy & Precautions, NPO status , Patient's Chart, lab work & pertinent test results, reviewed documented beta blocker date and time   Airway Mallampati: II  TM Distance: >3 FB Neck ROM: Full    Dental  (+) Teeth Intact, Dental Advisory Given   Pulmonary sleep apnea and Continuous Positive Airway Pressure Ventilation    Pulmonary exam normal breath sounds clear to auscultation       Cardiovascular hypertension, Pt. on home beta blockers and Pt. on medications + CAD, + Past MI, + Cardiac Stents and +CHF  Normal cardiovascular exam Rhythm:Regular Rate:Normal  09/26/23: myoview  shows normal perfusion. EF 45% consistent with prior studies   Neuro/Psych negative neurological ROS     GI/Hepatic negative GI ROS, Neg liver ROS,,,  Endo/Other  negative endocrine ROS    Renal/GU negative Renal ROS     Musculoskeletal  (+) Arthritis , Osteoarthritis,    Abdominal   Peds  Hematology negative hematology ROS (+)   Anesthesia Other Findings   Reproductive/Obstetrics                              Anesthesia Physical Anesthesia Plan  ASA: 3  Anesthesia Plan: Spinal   Post-op Pain Management: Regional block* and Tylenol  PO (pre-op)*   Induction: Intravenous  PONV Risk Score and Plan: 1 and TIVA, Treatment may vary due to age or medical condition, Midazolam , Dexamethasone and Ondansetron   Airway Management Planned: Natural Airway and Simple Face Mask  Additional Equipment:   Intra-op Plan:   Post-operative Plan:   Informed Consent: I have reviewed the patients History and Physical, chart, labs and discussed the procedure including the risks, benefits and alternatives for the proposed anesthesia with the patient or authorized representative who has indicated his/her understanding and acceptance.      Dental advisory given  Plan Discussed with: CRNA  Anesthesia Plan Comments: (See PAT note from 10/28)         Anesthesia Quick Evaluation

## 2024-05-29 NOTE — Progress Notes (Signed)
 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  He had a STEMI in 2017 which led to stenting of the mid RCA.  He had associated ischemic cardiomyopathy.  Last seen in clinic on 09/06/2023 by Dr. Jordan.  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  His chest test came back with no evidence of ischemia and low risk.  EF estimated 45 to 54%.  Cardiac clearance televisit done on 05/02/2024 for upcoming knee surgery.  Risk assessment done:  Preoperative Cardiovascular Risk Assessment: - Patient's RCRI score is 11%   The patient affirms he has been doing well without any new cardiac symptoms. They are able to achieve 7 METS without cardiac limitations. Therefore, based on ACC/AHA guidelines, the patient would be at acceptable risk for the planned procedure without further cardiovascular testing. The patient was advised that if he develops new symptoms prior to surgery to contact our office to arrange for a follow-up visit, and he verbalized understanding.  Patient follows with Oncology at [facility] for hx of neuroendocrine tumor of the stomach s/p total gastrectomy in 2017. He is currently in remission and undergoing surveillance. Last seen on 03/18/24. CT chest/abd/pelvis on 03/18/24 showed no evidence of mets but new pulmonary nodules. Repeat imaging ordered for 6 months.  VS: BP (!) 147/92   Pulse 67   Temp 37.3 C (Oral)   Ht 5' 10 (1.778 m)   Wt 83.5 kg   SpO2 100%   BMI 26.40 kg/m    PROVIDERS: Douglass Ivanoff, MD   LABS: CBC, BMP done at [facility] on 05/22/24. WBC 5, Hgb 14, Hct 40, Platelets 165. Na 138, K 4.9, CO3 30, BUN 13, Creatinine 1.01, Glucose 90.  Labs Reviewed  SURGICAL PCR SCREEN - Abnormal; Notable for the following components:      Result Value   MRSA, PCR POSITIVE (*)    Staphylococcus aureus POSITIVE (*)    All other components within normal limits     CT chest/abdomen/pelvis 03/18/2024:  Impression: 1.  No evidence of metastatic disease in the chest, abdomen or pelvis. 2.  New right lower lobe 2 mm pulmonary nodule, recommend attention on follow-up.  EKG 09/06/2023:  Normal sinus rhythm with sinus arrhythmia Normal ECG  Stress test 09/26/2023:    Findings are consistent with no ischemia. The study is low risk.   No ST deviation was noted.   LV perfusion is equivocal. There is no evidence of ischemia. There is no evidence of infarction.   Left ventricular function is abnormal. Global function is mildly reduced. Nuclear stress EF: 45%. The left ventricular ejection fraction is mildly decreased (45-54%). End diastolic cavity size is mildly enlarged.   Prior study available for comparison from 10/08/2021. There are changes compared to prior study which appear to be new. The left ventricular ejection fraction has decreased.   No evidence of ischemia. There is high gut uptake and low cardiac counts; this leads to likely artifact of mild  perfusion defect at rest that improves on stress imaging. EF is mildly reduced with mild inferior wall motion abnormality. This may also be due to artifact as visually it appears within normal limits. Can consider echo for more definitive evaluation of wall motion/function if clinically indicated Past Medical History:  Diagnosis Date   Arthritis    CAD (coronary artery disease)    a. 09/2015 Inf STEMI/PCI: LM nl, LAD nl, RI nl, LCX nl, RCA 46m (3.5 x 16 Promus Premier MR DES).   Chronic combined systolic and diastolic  CHF (congestive heart failure) (HCC) 09/2015   a. 09/2009 EF 35-45% by LV gram @ time of MI;  b. 09/2015 Echo: EF 40-45%, Gr2 DD, mild MR, mod-sev dil LA, triv TR, PASP .   Hyperlipidemia    Iron deficiency anemia 09/2015   Ischemic cardiomyopathy    a. 09/2015 Echo: EF 40-45%   MI (myocardial infarction) (HCC) 2//112017   Obesity (BMI 30.0-34.9) 2015   OSA on CPAP 08/2013   snoring. sleep study confirmed.  Dr Dedra Dohmeier, pt does not knoe cpap settings   Upper GI bleed 10/29/2015   a. 09/2015 EGD: multiple subepithelial gastric nodules with overlying multiple, oozing ulcers.  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 Now followed @ [facility] wth plan for repeat EGD/colonoscopy.    Past Surgical History:  Procedure Laterality Date   CARDIAC CATHETERIZATION N/A 09/12/2015   Left Heart Cath and Coronary Angiography; Dr Peter M Jordan, MD;    CARDIAC CATHETERIZATION N/A 09/12/2015   placement Promus stent to mid RCA, Dr Peter M Jordan, MD   ESOPHAGOGASTRODUODENOSCOPY N/A 10/29/2015   Procedure: ESOPHAGOGASTRODUODENOSCOPY (EGD);  Surgeon: Gustav Shila GAILS, MD;  Location: [facility];  Service: Endoscopy;  Laterality: N/A;   EUS N/A 11/05/2015   Procedure: FULL UPPER ENDOSCOPIC ULTRASOUND (EUS) RADIAL;  Surgeon: Toribio SHAUNNA Cedar, MD;  Location: [facility];  Service: Endoscopy;  Laterality: N/A;    MEDICATIONS:  aspirin  EC 81 MG tablet   atorvastatin  (LIPITOR ) 80 MG tablet   carvedilol  (COREG ) 12.5 MG tablet   cyanocobalamin (,VITAMIN B-12,) 1000 MCG/ML injection   ferrous sulfate  325 (65 FE) MG EC tablet   losartan  (COZAAR ) 50 MG tablet   Multiple Vitamins-Minerals (MULTIVITAMIN WITH MINERALS) tablet   nitroGLYCERIN  (NITROSTAT ) 0.4 MG SL tablet   No current facility-administered medications for this encounter.   Burnard CHRISTELLA Odis DEVONNA [facility] Phone [phone] 05/29/2024 1:36 PM

## 2024-06-10 ENCOUNTER — Ambulatory Visit (HOSPITAL_BASED_OUTPATIENT_CLINIC_OR_DEPARTMENT_OTHER): Payer: Self-pay | Admitting: Anesthesiology

## 2024-06-10 ENCOUNTER — Observation Stay (HOSPITAL_COMMUNITY)
Admission: RE | Admit: 2024-06-10 | Discharge: 2024-06-11 | Disposition: A | Source: Ambulatory Visit | Attending: Orthopedic Surgery | Admitting: Orthopedic Surgery

## 2024-06-10 ENCOUNTER — Encounter (HOSPITAL_COMMUNITY): Payer: Self-pay | Admitting: Orthopedic Surgery

## 2024-06-10 ENCOUNTER — Ambulatory Visit (HOSPITAL_COMMUNITY): Payer: Self-pay | Admitting: Medical

## 2024-06-10 ENCOUNTER — Other Ambulatory Visit: Payer: Self-pay

## 2024-06-10 ENCOUNTER — Encounter (HOSPITAL_COMMUNITY)
Admission: RE | Disposition: A | Discharge status: Home / Self Care | Payer: Self-pay | Source: Ambulatory Visit | Attending: Orthopedic Surgery

## 2024-06-10 DIAGNOSIS — Z85028 Personal history of other malignant neoplasm of stomach: Secondary | ICD-10-CM | POA: Insufficient documentation

## 2024-06-10 DIAGNOSIS — I5042 Chronic combined systolic (congestive) and diastolic (congestive) heart failure: Secondary | ICD-10-CM | POA: Insufficient documentation

## 2024-06-10 DIAGNOSIS — M179 Osteoarthritis of knee, unspecified: Principal | ICD-10-CM | POA: Diagnosis present

## 2024-06-10 DIAGNOSIS — I2510 Atherosclerotic heart disease of native coronary artery without angina pectoris: Secondary | ICD-10-CM | POA: Insufficient documentation

## 2024-06-10 DIAGNOSIS — M1712 Unilateral primary osteoarthritis, left knee: Principal | ICD-10-CM | POA: Diagnosis present

## 2024-06-10 DIAGNOSIS — Z79899 Other long term (current) drug therapy: Secondary | ICD-10-CM | POA: Diagnosis not present

## 2024-06-10 DIAGNOSIS — Z951 Presence of aortocoronary bypass graft: Secondary | ICD-10-CM | POA: Diagnosis not present

## 2024-06-10 DIAGNOSIS — I110 Hypertensive heart disease with heart failure: Secondary | ICD-10-CM | POA: Insufficient documentation

## 2024-06-10 DIAGNOSIS — M25562 Pain in left knee: Secondary | ICD-10-CM | POA: Diagnosis present

## 2024-06-10 HISTORY — PX: TOTAL KNEE ARTHROPLASTY: SHX125

## 2024-06-10 SURGERY — ARTHROPLASTY, KNEE, TOTAL
Anesthesia: Spinal | Site: Knee | Laterality: Left

## 2024-06-10 MED ORDER — 0.9 % SODIUM CHLORIDE (POUR BTL) OPTIME
TOPICAL | Status: DC | PRN
Start: 2024-06-10 — End: 2024-06-10
  Administered 2024-06-10: 1000 mL

## 2024-06-10 MED ORDER — SODIUM CHLORIDE 0.9 % IV SOLN: INTRAVENOUS | Status: DC

## 2024-06-10 MED ORDER — OXYCODONE HCL 5 MG PO TABS
5.0000 mg | ORAL_TABLET | ORAL | Status: DC | PRN
  Administered 2024-06-10 – 2024-06-11 (×5): 10 mg via ORAL
  Filled 2024-06-10 (×5): qty 2

## 2024-06-10 MED ORDER — ONDANSETRON HCL 4 MG/2ML IJ SOLN
INTRAMUSCULAR | Status: DC | PRN
  Administered 2024-06-10: 4 mg via INTRAVENOUS

## 2024-06-10 MED ORDER — PROPOFOL 1000 MG/100ML IV EMUL
INTRAVENOUS | Status: AC
  Filled 2024-06-10: qty 200

## 2024-06-10 MED ORDER — ATORVASTATIN CALCIUM 40 MG PO TABS
80.0000 mg | ORAL_TABLET | Freq: Every day | ORAL | Status: DC
  Administered 2024-06-11: 80 mg via ORAL
  Filled 2024-06-10: qty 2

## 2024-06-10 MED ORDER — FENTANYL CITRATE (PF) 50 MCG/ML IJ SOSY
50.0000 ug | PREFILLED_SYRINGE | INTRAMUSCULAR | Status: DC
  Administered 2024-06-10: 50 ug via INTRAVENOUS
  Filled 2024-06-10: qty 2

## 2024-06-10 MED ORDER — MENTHOL 3 MG MT LOZG: 1.0000 | LOZENGE | OROMUCOSAL | Status: DC | PRN

## 2024-06-10 MED ORDER — PROPOFOL 10 MG/ML IV BOLUS
INTRAVENOUS | Status: DC | PRN
  Administered 2024-06-10: 20 mg via INTRAVENOUS

## 2024-06-10 MED ORDER — CEFAZOLIN SODIUM-DEXTROSE 2-4 GM/100ML-% IV SOLN
2.0000 g | INTRAVENOUS | Status: AC
  Administered 2024-06-10: 2 g via INTRAVENOUS
  Filled 2024-06-10: qty 100

## 2024-06-10 MED ORDER — MUPIROCIN 2 % EX OINT: 1.0000 | TOPICAL_OINTMENT | Freq: Two times a day (BID) | CUTANEOUS | 0 refills | Status: DC

## 2024-06-10 MED ORDER — ACETAMINOPHEN 500 MG PO TABS
1000.0000 mg | ORAL_TABLET | Freq: Four times a day (QID) | ORAL | Status: AC
  Administered 2024-06-10 – 2024-06-11 (×2): 1000 mg via ORAL
  Filled 2024-06-10 (×2): qty 2

## 2024-06-10 MED ORDER — DEXAMETHASONE SOD PHOSPHATE PF 10 MG/ML IJ SOLN: 8.0000 mg | Freq: Once | INTRAMUSCULAR | Status: DC

## 2024-06-10 MED ORDER — CHLORHEXIDINE GLUCONATE 0.12 % MT SOLN
15.0000 mL | Freq: Once | OROMUCOSAL | Status: AC
  Administered 2024-06-10: 15 mL via OROMUCOSAL

## 2024-06-10 MED ORDER — CEFAZOLIN SODIUM-DEXTROSE 2-4 GM/100ML-% IV SOLN
2.0000 g | Freq: Four times a day (QID) | INTRAVENOUS | Status: AC
  Administered 2024-06-10 (×2): 2 g via INTRAVENOUS
  Filled 2024-06-10 (×2): qty 100

## 2024-06-10 MED ORDER — PROPOFOL 500 MG/50ML IV EMUL
INTRAVENOUS | Status: DC | PRN
  Administered 2024-06-10: 85 ug/kg/min via INTRAVENOUS

## 2024-06-10 MED ORDER — CHLORHEXIDINE GLUCONATE 4 % EX SOLN: 1.0000 | CUTANEOUS | 1 refills | Status: DC

## 2024-06-10 MED ORDER — LACTATED RINGERS IV SOLN: INTRAVENOUS | Status: DC

## 2024-06-10 MED ORDER — DEXAMETHASONE SOD PHOSPHATE PF 10 MG/ML IJ SOLN
10.0000 mg | Freq: Once | INTRAMUSCULAR | Status: AC
  Administered 2024-06-11: 10 mg via INTRAVENOUS

## 2024-06-10 MED ORDER — LOSARTAN POTASSIUM 50 MG PO TABS
50.0000 mg | ORAL_TABLET | Freq: Every day | ORAL | Status: DC
  Administered 2024-06-11: 50 mg via ORAL
  Filled 2024-06-10: qty 1

## 2024-06-10 MED ORDER — DOCUSATE SODIUM 100 MG PO CAPS
100.0000 mg | ORAL_CAPSULE | Freq: Two times a day (BID) | ORAL | Status: DC
  Administered 2024-06-10 – 2024-06-11 (×3): 100 mg via ORAL
  Filled 2024-06-10 (×3): qty 1

## 2024-06-10 MED ORDER — DEXAMETHASONE SOD PHOSPHATE PF 10 MG/ML IJ SOLN
INTRAMUSCULAR | Status: DC | PRN
  Administered 2024-06-10: 10 mg via INTRAVENOUS

## 2024-06-10 MED ORDER — SODIUM CHLORIDE 0.9 % IR SOLN
Status: DC | PRN
  Administered 2024-06-10: 1000 mL

## 2024-06-10 MED ORDER — SODIUM CHLORIDE (PF) 0.9 % IJ SOLN
INTRAMUSCULAR | Status: AC
Start: 2024-06-10 — End: 2024-06-10
  Filled 2024-06-10: qty 10

## 2024-06-10 MED ORDER — STERILE WATER FOR IRRIGATION IR SOLN
Status: DC | PRN
  Administered 2024-06-10: 2000 mL

## 2024-06-10 MED ORDER — METHOCARBAMOL 1000 MG/10ML IJ SOLN: 500.0000 mg | Freq: Four times a day (QID) | INTRAMUSCULAR | Status: DC | PRN

## 2024-06-10 MED ORDER — HYDROMORPHONE HCL 1 MG/ML IJ SOLN
0.5000 mg | INTRAMUSCULAR | Status: DC | PRN
  Administered 2024-06-10 (×2): 1 mg via INTRAVENOUS
  Filled 2024-06-10 (×2): qty 1

## 2024-06-10 MED ORDER — BUPIVACAINE IN DEXTROSE 0.75-8.25 % IT SOLN
INTRATHECAL | Status: DC | PRN
Start: 2024-06-10 — End: 2024-06-10
  Administered 2024-06-10: 1.6 mL via INTRATHECAL

## 2024-06-10 MED ORDER — NITROGLYCERIN 0.4 MG SL SUBL: 0.4000 mg | SUBLINGUAL_TABLET | SUBLINGUAL | Status: DC | PRN

## 2024-06-10 MED ORDER — MIDAZOLAM HCL (PF) 2 MG/2ML IJ SOLN
1.0000 mg | INTRAMUSCULAR | Status: DC
  Administered 2024-06-10: 2 mg via INTRAVENOUS
  Filled 2024-06-10: qty 2

## 2024-06-10 MED ORDER — FLEET ENEMA RE ENEM
1.0000 | ENEMA | Freq: Once | RECTAL | Status: DC | PRN
Start: 2024-06-10 — End: 2024-06-11

## 2024-06-10 MED ORDER — RIVAROXABAN 10 MG PO TABS
10.0000 mg | ORAL_TABLET | Freq: Every day | ORAL | Status: DC
  Administered 2024-06-11: 10 mg via ORAL
  Filled 2024-06-10: qty 1

## 2024-06-10 MED ORDER — METOCLOPRAMIDE HCL 5 MG PO TABS: 5.0000 mg | ORAL_TABLET | Freq: Three times a day (TID) | ORAL | Status: DC | PRN

## 2024-06-10 MED ORDER — METOCLOPRAMIDE HCL 5 MG/ML IJ SOLN: 5.0000 mg | Freq: Three times a day (TID) | INTRAMUSCULAR | Status: DC | PRN

## 2024-06-10 MED ORDER — DIPHENHYDRAMINE HCL 12.5 MG/5ML PO ELIX: 12.5000 mg | ORAL_SOLUTION | ORAL | Status: DC | PRN

## 2024-06-10 MED ORDER — BUPIVACAINE LIPOSOME 1.3 % IJ SUSP: 20.0000 mL | Freq: Once | INTRAMUSCULAR | Status: DC

## 2024-06-10 MED ORDER — BISACODYL 10 MG RE SUPP
10.0000 mg | Freq: Every day | RECTAL | Status: DC | PRN
Start: 2024-06-10 — End: 2024-06-11

## 2024-06-10 MED ORDER — PHENOL 1.4 % MT LIQD: 1.0000 | OROMUCOSAL | Status: DC | PRN

## 2024-06-10 MED ORDER — FENTANYL CITRATE (PF) 50 MCG/ML IJ SOSY: 25.0000 ug | PREFILLED_SYRINGE | INTRAMUSCULAR | Status: DC | PRN

## 2024-06-10 MED ORDER — ONDANSETRON HCL 4 MG/2ML IJ SOLN: 4.0000 mg | Freq: Once | INTRAMUSCULAR | Status: DC | PRN

## 2024-06-10 MED ORDER — BUPIVACAINE LIPOSOME 1.3 % IJ SUSP
INTRAMUSCULAR | Status: AC
  Filled 2024-06-10: qty 20

## 2024-06-10 MED ORDER — POLYETHYLENE GLYCOL 3350 17 G PO PACK: 17.0000 g | PACK | Freq: Every day | ORAL | Status: DC | PRN

## 2024-06-10 MED ORDER — SODIUM CHLORIDE (PF) 0.9 % IJ SOLN
INTRAMUSCULAR | Status: DC | PRN
  Administered 2024-06-10: 80 mL

## 2024-06-10 MED ORDER — ONDANSETRON HCL 4 MG/2ML IJ SOLN
INTRAMUSCULAR | Status: AC
  Filled 2024-06-10: qty 2

## 2024-06-10 MED ORDER — ORAL CARE MOUTH RINSE
15.0000 mL | Freq: Once | OROMUCOSAL | Status: AC
Start: 2024-06-10 — End: 2024-06-10

## 2024-06-10 MED ORDER — TRAMADOL HCL 50 MG PO TABS: 50.0000 mg | ORAL_TABLET | Freq: Four times a day (QID) | ORAL | Status: DC | PRN

## 2024-06-10 MED ORDER — POVIDONE-IODINE 10 % EX SWAB: 2.0000 | Freq: Once | CUTANEOUS | Status: DC

## 2024-06-10 MED ORDER — ONDANSETRON HCL 4 MG PO TABS: 4.0000 mg | ORAL_TABLET | Freq: Four times a day (QID) | ORAL | Status: DC | PRN

## 2024-06-10 MED ORDER — CARVEDILOL 12.5 MG PO TABS
12.5000 mg | ORAL_TABLET | Freq: Two times a day (BID) | ORAL | Status: DC
  Administered 2024-06-10 – 2024-06-11 (×2): 12.5 mg via ORAL
  Filled 2024-06-10 (×2): qty 1

## 2024-06-10 MED ORDER — GABAPENTIN 300 MG PO CAPS
300.0000 mg | ORAL_CAPSULE | Freq: Three times a day (TID) | ORAL | Status: DC
  Administered 2024-06-10 – 2024-06-11 (×3): 300 mg via ORAL
  Filled 2024-06-10 (×3): qty 1

## 2024-06-10 MED ORDER — ONDANSETRON HCL 4 MG/2ML IJ SOLN
4.0000 mg | Freq: Four times a day (QID) | INTRAMUSCULAR | Status: DC | PRN
  Administered 2024-06-10: 4 mg via INTRAVENOUS
  Filled 2024-06-10: qty 2

## 2024-06-10 MED ORDER — ROPIVACAINE HCL 5 MG/ML IJ SOLN
INTRAMUSCULAR | Status: DC | PRN
  Administered 2024-06-10: 20 mL via PERINEURAL

## 2024-06-10 MED ORDER — TRANEXAMIC ACID-NACL 1000-0.7 MG/100ML-% IV SOLN
1000.0000 mg | INTRAVENOUS | Status: AC
  Administered 2024-06-10: 1000 mg via INTRAVENOUS
  Filled 2024-06-10: qty 100

## 2024-06-10 MED ORDER — SODIUM CHLORIDE (PF) 0.9 % IJ SOLN
INTRAMUSCULAR | Status: AC
  Filled 2024-06-10: qty 50

## 2024-06-10 MED ORDER — VANCOMYCIN HCL IN DEXTROSE 1-5 GM/200ML-% IV SOLN
1000.0000 mg | Freq: Once | INTRAVENOUS | Status: AC
  Administered 2024-06-10: 1000 mg via INTRAVENOUS
  Filled 2024-06-10: qty 200

## 2024-06-10 MED ORDER — METHOCARBAMOL 500 MG PO TABS
500.0000 mg | ORAL_TABLET | Freq: Four times a day (QID) | ORAL | Status: DC | PRN
  Administered 2024-06-10 – 2024-06-11 (×3): 500 mg via ORAL
  Filled 2024-06-10 (×3): qty 1

## 2024-06-10 MED ORDER — ACETAMINOPHEN 10 MG/ML IV SOLN
1000.0000 mg | Freq: Four times a day (QID) | INTRAVENOUS | Status: DC
  Administered 2024-06-10: 1000 mg via INTRAVENOUS
  Filled 2024-06-10: qty 100

## 2024-06-10 MED ORDER — AMISULPRIDE (ANTIEMETIC) 5 MG/2ML IV SOLN: 10.0000 mg | Freq: Once | INTRAVENOUS | Status: DC | PRN

## 2024-06-10 SURGICAL SUPPLY — 43 items
ATTUNE MED DOME PAT 41 KNEE (Knees) IMPLANT
ATTUNE PS FEM LT SZ 7 CEM KNEE (Femur) IMPLANT
ATTUNE PSRP INSR SZ7 12 KNEE (Insert) IMPLANT
BAG COUNTER SPONGE SURGICOUNT (BAG) IMPLANT
BAG ZIPLOCK 12X15 (MISCELLANEOUS) ×2 IMPLANT
BASE TIBIAL ROT PLAT SZ 8 KNEE (Knees) IMPLANT
BLADE SAG 18X100X1.27 (BLADE) ×2 IMPLANT
BLADE SAW SGTL 11.0X1.19X90.0M (BLADE) ×2 IMPLANT
BNDG ELASTIC 4X5.8 VLCR NS LF (GAUZE/BANDAGES/DRESSINGS) IMPLANT
BNDG ELASTIC 6INX 5YD STR LF (GAUZE/BANDAGES/DRESSINGS) ×2 IMPLANT
BOWL SMART MIX CTS (DISPOSABLE) ×2 IMPLANT
CEMENT HV SMART SET (Cement) ×4 IMPLANT
COVER SURGICAL LIGHT HANDLE (MISCELLANEOUS) ×2 IMPLANT
CUFF TRNQT CYL 34X4.125X (TOURNIQUET CUFF) ×2 IMPLANT
DERMABOND ADVANCED .7 DNX12 (GAUZE/BANDAGES/DRESSINGS) ×2 IMPLANT
DRAPE U-SHAPE 47X51 STRL (DRAPES) ×2 IMPLANT
DRESSING AQUACEL AG SP 3.5X10 (GAUZE/BANDAGES/DRESSINGS) IMPLANT
DRSG AQUACEL AG ADV 3.5X10 (GAUZE/BANDAGES/DRESSINGS) ×2 IMPLANT
DURAPREP 26ML APPLICATOR (WOUND CARE) ×2 IMPLANT
ELECT REM PT RETURN 15FT ADLT (MISCELLANEOUS) ×2 IMPLANT
GLOVE BIO SURGEON STRL SZ 6.5 (GLOVE) IMPLANT
GLOVE BIO SURGEON STRL SZ8 (GLOVE) ×2 IMPLANT
GLOVE BIOGEL PI IND STRL 7.0 (GLOVE) ×2 IMPLANT
GLOVE BIOGEL PI IND STRL 8 (GLOVE) ×2 IMPLANT
GOWN STRL REUS W/ TWL LRG LVL3 (GOWN DISPOSABLE) ×2 IMPLANT
HOLDER FOLEY CATH W/STRAP (MISCELLANEOUS) ×2 IMPLANT
IMMOBILIZER KNEE 20 THIGH 36 (SOFTGOODS) ×2 IMPLANT
KIT TURNOVER KIT A (KITS) ×2 IMPLANT
MANIFOLD NEPTUNE II (INSTRUMENTS) ×2 IMPLANT
NS IRRIG 1000ML POUR BTL (IV SOLUTION) ×2 IMPLANT
PACK TOTAL KNEE CUSTOM (KITS) ×2 IMPLANT
PADDING CAST COTTON 6X4 STRL (CAST SUPPLIES) ×4 IMPLANT
PENCIL SMOKE EVACUATOR (MISCELLANEOUS) ×2 IMPLANT
PIN STEINMAN FIXATION KNEE (PIN) IMPLANT
PROTECTOR NERVE ULNAR (MISCELLANEOUS) ×2 IMPLANT
SET HNDPC FAN SPRY TIP SCT (DISPOSABLE) ×2 IMPLANT
SUT MNCRL AB 4-0 PS2 18 (SUTURE) ×2 IMPLANT
SUT VIC AB 2-0 CT1 TAPERPNT 27 (SUTURE) ×6 IMPLANT
SUTURE STRATFX 0 PDS 27 VIOLET (SUTURE) ×2 IMPLANT
TOWEL GREEN STERILE FF (TOWEL DISPOSABLE) ×2 IMPLANT
TRAY FOLEY MTR SLVR 16FR STAT (SET/KITS/TRAYS/PACK) ×2 IMPLANT
TUBE SUCTION HIGH CAP CLEAR NV (SUCTIONS) ×2 IMPLANT
WRAP KNEE MAXI GEL POST OP (GAUZE/BANDAGES/DRESSINGS) ×2 IMPLANT

## 2024-06-10 NOTE — Transfer of Care (Signed)
 Immediate Anesthesia Transfer of Care Note  Patient: James Mullins  Procedure(s) Performed: ARTHROPLASTY, KNEE, TOTAL (Left: Knee)  Patient Location: PACU  Anesthesia Type:Spinal  Level of Consciousness: awake, alert , and oriented  Airway & Oxygen Therapy: Patient Spontanous Breathing  Post-op Assessment: Report given to RN  Post vital signs: Reviewed and stable  Last Vitals:  Vitals Value Taken Time  BP 121/72 06/10/24 09:45  Temp    Pulse 53 06/10/24 09:46  Resp 12 06/10/24 09:46  SpO2 98 % 06/10/24 09:46  Vitals shown include unfiled device data.  Last Pain:  Vitals:   06/10/24 9361  TempSrc:   PainSc: 0-No pain         Complications: No notable events documented.

## 2024-06-10 NOTE — Interval H&P Note (Signed)
 History and Physical Interval Note:  06/10/2024 6:30 AM  James Mullins  has presented today for surgery, with the diagnosis of Left knee osteoarthritis.  The various methods of treatment have been discussed with the patient and family. After consideration of risks, benefits and other options for treatment, the patient has consented to  Procedure(s): ARTHROPLASTY, KNEE, TOTAL (Left) as a surgical intervention.  The patient's history has been reviewed, patient examined, no change in status, stable for surgery.  I have reviewed the patient's chart and labs.  Questions were answered to the patient's satisfaction.     Dempsey Tajon Moring

## 2024-06-10 NOTE — Evaluation (Signed)
 Physical Therapy Evaluation Patient Details Name: James Mullins MRN: [id number] DOB: 05/30/1961 Today's Date: 06/10/2024  History of Present Illness  63 yo male presents to therapy s/p L TKA on 06/10/2024 due to failure of conservative measures. Pt PMH includes but is not limited to: gastric ca, gouty arthritis, GI bleed, CAD, s and dCHF, OSA, and STEMI.  Clinical Impression    ENIO HORNBACK is a 63 y.o. male POD 0 s/p L TKA. Patient reports IND with mobility at baseline. Patient is now limited by functional impairments (see PT problem list below) and requires CGA for bed mobility and CGA for transfers. Patient was able to ambulate 40 feet with RW and CGA level of assist. Patient instructed in exercise to facilitate ROM and circulation to manage edema. Patient will benefit from continued skilled PT interventions to address impairments and progress towards PLOF. Acute PT will follow to progress mobility and stair training in preparation for safe discharge home with family support and OPPT services.       If plan is discharge home, recommend the following: A little help with walking and/or transfers;A little help with bathing/dressing/bathroom;Assistance with cooking/housework;Assist for transportation   Can travel by private vehicle        Equipment Recommendations Rolling walker (2 wheels)  Recommendations for Other Services       Functional Status Assessment Patient has had a recent decline in their functional status and demonstrates the ability to make significant improvements in function in a reasonable and predictable amount of time.     Precautions / Restrictions Precautions Precautions: Fall;Knee Restrictions Weight Bearing Restrictions Per Provider Order: Yes LLE Weight Bearing Per Provider Order: Weight bearing as tolerated      Mobility  Bed Mobility Overal bed mobility: Needs Assistance Bed Mobility: Supine to Sit     Supine to sit: Contact guard, HOB  elevated     General bed mobility comments: min cues    Transfers Overall transfer level: Needs assistance Equipment used: Rolling walker (2 wheels) Transfers: Sit to/from Stand Sit to Stand: Contact guard assist           General transfer comment: min cues    Ambulation/Gait Ambulation/Gait assistance: Contact guard assist Gait Distance (Feet): 40 Feet Assistive device: Rolling walker (2 wheels) Gait Pattern/deviations: Step-to pattern, Decreased stance time - left, Antalgic, Trunk flexed Gait velocity: decreased     General Gait Details: slight trunk flexion with B UE support at RW to offload L LE in stance phase min cues for safety and RW management  Stairs            Wheelchair Mobility     Tilt Bed    Modified Rankin (Stroke Patients Only)       Balance Overall balance assessment: Needs assistance Sitting-balance support: Feet supported Sitting balance-Leahy Scale: Good     Standing balance support: Bilateral upper extremity supported, During functional activity, Reliant on assistive device for balance Standing balance-Leahy Scale: Poor                               Pertinent Vitals/Pain Pain Assessment Pain Assessment: 0-10 Pain Score: 5  Pain Location: L knee and LE Pain Descriptors / Indicators: Aching, Constant, Discomfort, Dull, Grimacing, Operative site guarding Pain Intervention(s): Limited activity within patient's tolerance, Monitored during session, Premedicated before session, Repositioned, Ice applied    Home Living Family/patient expects to be discharged to:: Private residence Living Arrangements: Spouse/significant  other Available Help at Discharge: Family Type of Home: House (townhome) Home Access: Ramped entrance     Alternate Level Stairs-Number of Steps: flight Home Layout: Two level;Able to live on main level with bedroom/bathroom Home Equipment: Rolling Walker (2 wheels);Cane - single point      Prior  Function Prior Level of Function : Independent/Modified Independent;Driving             Mobility Comments: IND no AD for all ADLs, self care tasks and IADLs       Extremity/Trunk Assessment        Lower Extremity Assessment Lower Extremity Assessment: LLE deficits/detail LLE Deficits / Details: ankle DF/PF 5/5;SLR < 10 degree lag LLE Sensation: WNL    Cervical / Trunk Assessment Cervical / Trunk Assessment: Normal  Communication   Communication Communication: No apparent difficulties    Cognition Arousal: Alert Behavior During Therapy: WFL for tasks assessed/performed   PT - Cognitive impairments: No apparent impairments                         Following commands: Intact       Cueing       General Comments      Exercises Total Joint Exercises Ankle Circles/Pumps: AROM, Both, 10 reps   Assessment/Plan    PT Assessment Patient needs continued PT services  PT Problem List Decreased strength;Decreased range of motion;Decreased activity tolerance;Decreased balance;Decreased mobility;Decreased coordination;Pain       PT Treatment Interventions DME instruction;Gait training;Stair training;Functional mobility training;Therapeutic activities;Therapeutic exercise;Balance training;Neuromuscular re-education;Patient/family education;Modalities    PT Goals (Current goals can be found in the Care Plan section)  Acute Rehab PT Goals Patient Stated Goal: to get back on the golf course PT Goal Formulation: With patient Time For Goal Achievement: 06/24/24 Potential to Achieve Goals: Good    Frequency 7X/week     Co-evaluation               AM-PAC PT 6 Clicks Mobility  Outcome Measure Help needed turning from your back to your side while in a flat bed without using bedrails?: A Little Help needed moving from lying on your back to sitting on the side of a flat bed without using bedrails?: A Little Help needed moving to and from a bed to a chair  (including a wheelchair)?: A Little Help needed standing up from a chair using your arms (e.g., wheelchair or bedside chair)?: A Little Help needed to walk in hospital room?: A Little Help needed climbing 3-5 steps with a railing? : A Lot 6 Click Score: 17    End of Session Equipment Utilized During Treatment: Gait belt Activity Tolerance: Patient tolerated treatment well;No increased pain Patient left: in chair;with call bell/phone within reach;with chair alarm set;with family/visitor present Nurse Communication: Mobility status PT Visit Diagnosis: Unsteadiness on feet (R26.81);Other abnormalities of gait and mobility (R26.89);Muscle weakness (generalized) (M62.81);Difficulty in walking, not elsewhere classified (R26.2);Pain Pain - Right/Left: Left Pain - part of body: Knee;Leg    Time: 8484-8471 PT Time Calculation (min) (ACUTE ONLY): 13 min   Charges:   PT Evaluation $PT Eval Low Complexity: 1 Low   PT General Charges $$ ACUTE PT VISIT: 1 Visit         [place], PT Acute Rehab   [place] VEAR Drone 06/10/2024, 6:56 PM

## 2024-06-10 NOTE — Op Note (Signed)
 OPERATIVE REPORT-TOTAL KNEE ARTHROPLASTY   Pre-operative diagnosis- Osteoarthritis  Left knee(s)  Post-operative diagnosis- Osteoarthritis Left knee(s)  Procedure-  Left  Total Knee Arthroplasty  Surgeon- Dempsey GAILS. Aariel Ems, MD  Assistant- Corean Sender, PA-C   Anesthesia-  Adductor canal block and spinal  EBL- 25 ml   Drains None  Tourniquet time-  Total Tourniquet Time Documented: Thigh (Left) - 33 minutes Total: Thigh (Left) - 33 minutes     Complications- None  Condition-PACU - hemodynamically stable.   Brief Clinical Note   James Mullins is a 63 y.o. year old male with end stage OA of his left knee with progressively worsening pain and dysfunction. He has constant pain, with activity and at rest and significant functional deficits with difficulties even with ADLs. He has had extensive non-op management including analgesics, injections of cortisone and viscosupplements, and home exercise program, but remains in significant pain with significant dysfunction. Radiographs show bone on bone arthritis medial and patellofemoral. He presents now for left Total Knee Arthroplasty.     Procedure in detail---   The patient is brought into the operating room and positioned supine on the operating table. After successful administration of  Adductor canal block and spinal,   a tourniquet is placed high on the  Left thigh(s) and the lower extremity is prepped and draped in the usual sterile fashion. Time out is performed by the operating team and then the  Left lower extremity is wrapped in Esmarch, knee flexed and the tourniquet inflated to 300 mmHg.       A midline incision is made with a ten blade through the subcutaneous tissue to the level of the extensor mechanism. A fresh blade is used to make a medial parapatellar arthrotomy. Soft tissue over the proximal medial tibia is subperiosteally elevated to the joint line with a knife and into the semimembranosus bursa with a Cobb  elevator. Soft tissue over the proximal lateral tibia is elevated with attention being paid to avoiding the patellar tendon on the tibial tubercle. The patella is everted, knee flexed 90 degrees and the ACL and PCL are removed. Findings are bone on bone medial and patellofemoral with large global osteophytes        The drill is used to create a starting hole in the distal femur and the canal is thoroughly irrigated with sterile saline to remove the fatty contents. The 5 degree Left  valgus alignment guide is placed into the femoral canal and the distal femoral cutting block is pinned to remove 10 mm off the distal femur. Resection is made with an oscillating saw.      The tibia is subluxed forward and the menisci are removed. The extramedullary alignment guide is placed referencing proximally at the medial aspect of the tibial tubercle and distally along the second metatarsal axis and tibial crest. The block is pinned to remove 2mm off the more deficient medial  side. Resection is made with an oscillating saw. Size 8is the most appropriate size for the tibia and the proximal tibia is prepared with the modular drill and keel punch for that size.      The femoral sizing guide is placed and size 7 is most appropriate. Rotation is marked off the epicondylar axis and confirmed by creating a rectangular flexion gap at 90 degrees. The size 7 cutting block is pinned in this rotation and the anterior, posterior and chamfer cuts are made with the oscillating saw. The intercondylar block is then placed and that cut  is made.      Trial size 8 tibial component, trial size 7 posterior stabilized femur and a 12  mm posterior stabilized rotating platform insert trial is placed. Full extension is achieved with excellent varus/valgus and anterior/posterior balance throughout full range of motion. The patella is everted and thickness measured to be 27  mm. Free hand resection is taken to 15 mm, a 41 template is placed, lug holes  are drilled, trial patella is placed, and it tracks normally. Osteophytes are removed off the posterior femur with the trial in place. All trials are removed and the cut bone surfaces prepared with pulsatile lavage. Cement is mixed and once ready for implantation, the size 8 tibial implant, size  7 posterior stabilized femoral component, and the size 41 patella are cemented in place and the patella is held with the clamp. The trial insert is placed and the knee held in full extension. The Exparel (20 ml mixed with 60 ml saline) is injected into the extensor mechanism, posterior capsule, medial and lateral gutters and subcutaneous tissues.  All extruded cement is removed and once the cement is hard the permanent 12 mm posterior stabilized rotating platform insert is placed into the tibial tray.      The wound is copiously irrigated with saline solution and the extensor mechanism closed with # 0 Stratofix suture. The tourniquet is released for a total tourniquet time of 33  minutes. Flexion against gravity is 140 degrees and the patella tracks normally. Subcutaneous tissue is closed with 2.0 vicryl and subcuticular with running 4.0 Monocryl. The incision is cleaned and dried and steri-strips and a bulky sterile dressing are applied. The limb is placed into a knee immobilizer and the patient is awakened and transported to recovery in stable condition.      Please note that a surgical assistant was a medical necessity for this procedure in order to perform it in a safe and expeditious manner. Surgical assistant was necessary to retract the ligaments and vital neurovascular structures to prevent injury to them and also necessary for proper positioning of the limb to allow for anatomic placement of the prosthesis.   Dempsey ROCKFORD Kiaraliz Rafuse, MD    06/10/2024, 9:17 AM

## 2024-06-10 NOTE — Anesthesia Procedure Notes (Signed)
 Anesthesia Regional Block: Adductor canal block   Pre-Anesthetic Checklist: , timeout performed,  Correct Patient, Correct Site, Correct Laterality,  Correct Procedure, Correct Position, site marked,  Risks and benefits discussed,  Surgical consent,  Pre-op evaluation,  At surgeon's request and post-op pain management  Laterality: Left  Prep: chloraprep       Needles:  Injection technique: Single-shot  Needle Type: Echogenic Needle     Needle Length: 9cm  Needle Gauge: 21     Additional Needles:   Procedures:,,,, ultrasound used (permanent image in chart),,    Narrative:  Start time: 06/10/2024 7:48 AM End time: 06/10/2024 7:55 AM Injection made incrementally with aspirations every 5 mL.  Performed by: Personally  Anesthesiologist: Corinne Garnette BRAVO, MD  Additional Notes: No pain on injection. No increased resistance to injection. Injection made in 5cc increments.  Good needle visualization.  Patient tolerated procedure well.

## 2024-06-10 NOTE — Discharge Instructions (Addendum)
 James Moan, MD Total Joint Specialist [facility] [address] [place], [place] [address] [phone]  TOTAL KNEE REPLACEMENT POSTOPERATIVE DIRECTIONS    Knee Rehabilitation, Guidelines Following Surgery  Results after knee surgery are often greatly improved when you follow the exercise, range of motion and muscle strengthening exercises prescribed by your doctor. Safety measures are also important to protect the knee from further injury. If any of these exercises cause you to have increased pain or swelling in your knee joint, decrease the amount until you are comfortable again and slowly increase them. If you have problems or questions, call your caregiver or physical therapist for advice.   BLOOD CLOT PREVENTION Take a 10 mg Xarelto once a day for three weeks following surgery. Then resume taking 81mg  aspirin  daily. You may resume your vitamins/supplements once you have discontinued the Xarelto. Do not take any NSAIDs (Advil, Aleve, Ibuprofen, Meloxicam, etc.) until you have discontinued the Xarelto.   HOME CARE INSTRUCTIONS  Remove items at home which could result in a fall. This includes throw rugs or furniture in walking pathways.  ICE to the affected knee as much as tolerated. Icing helps control swelling. If the swelling is well controlled you will be more comfortable and rehab easier. Continue to use ice on the knee for pain and swelling from surgery. You may notice swelling that will progress down to the foot and ankle. This is normal after surgery. Elevate the leg when you are not up walking on it.    Continue to use the breathing machine which will help keep your temperature down. It is common for your temperature to cycle up and down following surgery, especially at night when you are not up moving around and exerting yourself. The breathing machine keeps your lungs expanded and your temperature down. Do not place pillow under the operative  knee, focus on keeping the knee straight while resting  DIET You may resume your previous home diet once you are discharged from the hospital.  DRESSING / WOUND CARE / SHOWERING Keep your bulky bandage on for 2 days. On the third post-operative day you may remove the Ace bandage and gauze. There is a waterproof adhesive bandage on your skin which will stay in place until your first follow-up appointment. Once you remove this you will not need to place another bandage You may begin showering 3 days following surgery, but do not submerge the incision under water.  ACTIVITY For the first 5 days, the key is rest and control of pain and swelling Do your home exercises twice a day starting on post-operative day 3. On the days you go to physical therapy, just do the home exercises once that day. You should rest, ice and elevate the leg for 50 minutes out of every hour. Get up and walk/stretch for 10 minutes per hour. After 5 days you can increase your activity slowly as tolerated. Walk with your walker as instructed. Use the walker until you are comfortable transitioning to a cane. Walk with the cane in the opposite hand of the operative leg. You may discontinue the cane once you are comfortable and walking steadily. Avoid periods of inactivity such as sitting longer than an hour when not asleep. This helps prevent blood clots.  You may discontinue the knee immobilizer once you are able to perform a straight leg raise while lying down. You may resume a sexual relationship in one month or when given the OK by your doctor.  You  may return to work once you are cleared by your doctor.  Do not drive a car for 6 weeks or until released by your surgeon.  Do not drive while taking narcotics.  TED HOSE STOCKINGS Wear the elastic stockings on both legs for three weeks following surgery during the day. You may remove them at night for sleeping.  WEIGHT BEARING Weight bearing as tolerated with assist device  (walker, cane, etc) as directed, use it as long as suggested by your surgeon or therapist, typically at least 4-6 weeks.  POSTOPERATIVE CONSTIPATION PROTOCOL Constipation - defined medically as fewer than three stools per week and severe constipation as less than one stool per week.  One of the most common issues patients have following surgery is constipation.  Even if you have a regular bowel pattern at home, your normal regimen is likely to be disrupted due to multiple reasons following surgery.  Combination of anesthesia, postoperative narcotics, change in appetite and fluid intake all can affect your bowels.  In order to avoid complications following surgery, here are some recommendations in order to help you during your recovery period.  Colace (docusate) - Pick up an over-the-counter form of Colace or another stool softener and take twice a day as long as you are requiring postoperative pain medications.  Take with a full glass of water daily.  If you experience loose stools or diarrhea, hold the colace until you stool forms back up. If your symptoms do not get better within 1 week or if they get worse, check with your doctor. Dulcolax (bisacodyl) - Pick up over-the-counter and take as directed by the product packaging as needed to assist with the movement of your bowels.  Take with a full glass of water.  Use this product as needed if not relieved by Colace only.  MiraLax (polyethylene glycol) - Pick up over-the-counter to have on hand. MiraLax is a solution that will increase the amount of water in your bowels to assist with bowel movements.  Take as directed and can mix with a glass of water, juice, soda, coffee, or tea. Take if you go more than two days without a movement. Do not use MiraLax more than once per day. Call your doctor if you are still constipated or irregular after using this medication for 7 days in a row.  If you continue to have problems with postoperative constipation, please  contact the office for further assistance and recommendations.  If you experience the worst abdominal pain ever or develop nausea or vomiting, please contact the office immediatly for further recommendations for treatment.  ITCHING If you experience itching with your medications, try taking only a single pain pill, or even half a pain pill at a time.  You can also use Benadryl over the counter for itching or also to help with sleep.   MEDICATIONS See your medication summary on the "After Visit Summary" that the nursing staff will review with you prior to discharge.  You may have some home medications which will be placed on hold until you complete the course of blood thinner medication.  It is important for you to complete the blood thinner medication as prescribed by your surgeon.  Continue your approved medications as instructed at time of discharge.  PRECAUTIONS If you experience chest pain or shortness of breath - call 911 immediately for transfer to the hospital emergency department.  If you develop a fever greater that 101 F, purulent drainage from wound, increased redness or drainage from wound,  foul odor from the wound/dressing, or calf pain - CONTACT YOUR SURGEON.                                                   FOLLOW-UP APPOINTMENTS Make sure you keep all of your appointments after your operation with your surgeon and caregivers. You should call the office at the above phone number and make an appointment for approximately two weeks after the date of your surgery or on the date instructed by your surgeon outlined in the After Visit Summary.  RANGE OF MOTION AND STRENGTHENING EXERCISES  Rehabilitation of the knee is important following a knee injury or an operation. After just a few days of immobilization, the muscles of the thigh which control the knee become weakened and shrink (atrophy). Knee exercises are designed to build up the tone and strength of the thigh muscles and to  improve knee motion. Often times heat used for twenty to thirty minutes before working out will loosen up your tissues and help with improving the range of motion but do not use heat for the first two weeks following surgery. These exercises can be done on a training (exercise) mat, on the floor, on a table or on a bed. Use what ever works the best and is most comfortable for you Knee exercises include:  Leg Lifts - While your knee is still immobilized in a splint or cast, you can do straight leg raises. Lift the leg to 60 degrees, hold for 3 sec, and slowly lower the leg. Repeat 10-20 times 2-3 times daily. Perform this exercise against resistance later as your knee gets better.  Quad and Hamstring Sets - Tighten up the muscle on the front of the thigh (Quad) and hold for 5-10 sec. Repeat this 10-20 times hourly. Hamstring sets are done by pushing the foot backward against an object and holding for 5-10 sec. Repeat as with quad sets.  Leg Slides: Lying on your back, slowly slide your foot toward your buttocks, bending your knee up off the floor (only go as far as is comfortable). Then slowly slide your foot back down until your leg is flat on the floor again. Angel Wings: Lying on your back spread your legs to the side as far apart as you can without causing discomfort.  A rehabilitation program following serious knee injuries can speed recovery and prevent re-injury in the future due to weakened muscles. Contact your doctor or a physical therapist for more information on knee rehabilitation.   POST-OPERATIVE OPIOID TAPER INSTRUCTIONS: It is important to wean off of your opioid medication as soon as possible. If you do not need pain medication after your surgery it is ok to stop day one. Opioids include: Codeine, Hydrocodone (Norco, Vicodin), Oxycodone(Percocet, oxycontin) and hydromorphone amongst others.  Long term and even short term use of opiods can cause: Increased pain  response Dependence Constipation Depression Respiratory depression And more.  Withdrawal symptoms can include Flu like symptoms Nausea, vomiting And more Techniques to manage these symptoms Hydrate well Eat regular healthy meals Stay active Use relaxation techniques(deep breathing, meditating, yoga) Do Not substitute Alcohol to help with tapering If you have been on opioids for less than two weeks and do not have pain than it is ok to stop all together.  Plan to wean off of opioids This plan should start within  one week post op of your joint replacement. Maintain the same interval or time between taking each dose and first decrease the dose.  Cut the total daily intake of opioids by one tablet each day Next start to increase the time between doses. The last dose that should be eliminated is the evening dose.   IF YOU ARE TRANSFERRED TO A SKILLED REHAB FACILITY If the patient is transferred to a skilled rehab facility following release from the hospital, a list of the current medications will be sent to the facility for the patient to continue.  When discharged from the skilled rehab facility, please have the facility set up the patient's Home Health Physical Therapy prior to being released. Also, the skilled facility will be responsible for providing the patient with their medications at time of release from the facility to include their pain medication, the muscle relaxants, and their blood thinner medication. If the patient is still at the rehab facility at time of the two week follow up appointment, the skilled rehab facility will also need to assist the patient in arranging follow up appointment in our office and any transportation needs.  MAKE SURE YOU:  Understand these instructions.  Get help right away if you are not doing well or get worse.   DENTAL ANTIBIOTICS:  In most cases prophylactic antibiotics for Dental procdeures after total joint surgery are not  necessary.  Exceptions are as follows:  1. History of prior total joint infection  2. Severely immunocompromised (Organ Transplant, cancer chemotherapy, Rheumatoid biologic meds such as Humera)  3. Poorly controlled diabetes (A1C &gt; 8.0, blood glucose over 200)  If you have one of these conditions, contact your surgeon for an antibiotic prescription, prior to your dental procedure.    Pick up stool softner and laxative for home use following surgery while on pain medications. Do not submerge incision under water. Please use good hand washing techniques while changing dressing each day. May shower starting three days after surgery. Please use a clean towel to pat the incision dry following showers. Continue to use ice for pain and swelling after surgery. Do not use any lotions or creams on the incision until instructed by your surgeon.   Information on my medicine - XARELTO (Rivaroxaban)  This medication education was reviewed with me or my healthcare representative as part of my discharge preparation.    Why was Xarelto prescribed for you? Xarelto was prescribed for you to reduce the risk of blood clots forming after orthopedic surgery. The medical term for these abnormal blood clots is venous thromboembolism (VTE).  What do you need to know about xarelto ? Take your Xarelto ONCE DAILY at the same time every day. You may take it either with or without food.  If you have difficulty swallowing the tablet whole, you may crush it and mix in applesauce just prior to taking your dose.  Take Xarelto exactly as prescribed by your doctor and DO NOT stop taking Xarelto without talking to the doctor who prescribed the medication.  Stopping without other VTE prevention medication to take the place of Xarelto may increase your risk of developing a clot.  After discharge, you should have regular check-up appointments with your healthcare provider that is prescribing your Xarelto.     What do you do if you miss a dose? If you miss a dose, take it as soon as you remember on the same day then continue your regularly scheduled once daily regimen the next day. Do not  take two doses of Xarelto on the same day.   Important Safety Information A possible side effect of Xarelto is bleeding. You should call your healthcare provider right away if you experience any of the following: Bleeding from an injury or your nose that does not stop. Unusual colored urine (red or dark brown) or unusual colored stools (red or black). Unusual bruising for unknown reasons. A serious fall or if you hit your head (even if there is no bleeding).  Some medicines may interact with Xarelto and might increase your risk of bleeding while on Xarelto. To help avoid this, consult your healthcare provider or pharmacist prior to using any new prescription or non-prescription medications, including herbals, vitamins, non-steroidal anti-inflammatory drugs (NSAIDs) and supplements.  This website has more information on Xarelto: visitdestination.com.br.

## 2024-06-10 NOTE — Progress Notes (Signed)
 Orthopedic Tech Progress Note Patient Details:  James Mullins 07-Sep-1960 [id number]  Patient ID: James Mullins, male   DOB: 03-Sep-1960, 63 y.o.   MRN: [id number] Removed CPM from pt.  Morna Pink 06/10/2024, 2:18 PM

## 2024-06-10 NOTE — Progress Notes (Signed)
 Orthopedic Tech Progress Note Patient Details:  James Mullins [facility] 02-04-1961 [id number] Applied CPM per order. Will remove at 2:11 pm.  CPM Left Knee CPM Left Knee: On Left Knee Flexion (Degrees): 40 Left Knee Extension (Degrees): 10  Post Interventions Patient Tolerated: Well Instructions Provided: Adjustment of device, Care of device, Poper ambulation with device Ortho Devices Type of Ortho Device: CPM padding Ortho Device/Splint Location: LLE Ortho Device/Splint Interventions: Ordered, Application, Adjustment   Post Interventions Patient Tolerated: Well Instructions Provided: Adjustment of device, Care of device, Poper ambulation with device  James Mullins 06/10/2024, 10:17 AM

## 2024-06-10 NOTE — Anesthesia Postprocedure Evaluation (Signed)
 Anesthesia Post Note  Patient: Euan Wandler [place]  Procedure(s) Performed: ARTHROPLASTY, KNEE, TOTAL (Left: Knee)     Patient location during evaluation: Nursing Unit Anesthesia Type: Spinal Level of consciousness: awake, awake and alert and oriented Pain management: pain level controlled Vital Signs Assessment: post-procedure vital signs reviewed and stable Respiratory status: spontaneous breathing, nonlabored ventilation and respiratory function stable Cardiovascular status: blood pressure returned to baseline and stable Postop Assessment: no headache, no backache, spinal receding and no apparent nausea or vomiting Anesthetic complications: no   No notable events documented.  Last Vitals:  Vitals:   06/10/24 1306 06/10/24 1636  BP: (!) 150/83 (!) 157/89  Pulse: (!) 55 (!) 57  Resp: 16 18  Temp: 36.4 C   SpO2: 98% 96%    Last Pain:  Vitals:   06/10/24 1735  TempSrc:   PainSc: 3                  Garnette FORBES [place]

## 2024-06-10 NOTE — Plan of Care (Signed)
   Problem: Education: Goal: Knowledge of General Education information will improve Description: Including pain rating scale, medication(s)/side effects and non-pharmacologic comfort measures Outcome: Progressing   Problem: Nutrition: Goal: Adequate nutrition will be maintained Outcome: Progressing

## 2024-06-10 NOTE — Anesthesia Procedure Notes (Signed)
 Spinal  Patient location during procedure: OR Start time: 06/10/2024 8:14 AM End time: 06/10/2024 8:16 AM Reason for block: surgical anesthesia Staffing Performed: resident/CRNA  Resident/CRNA: Aaran Enberg C, CRNA Performed by: Latoyia Tecson C, CRNA Authorized by: Corinne Garnette BRAVO, MD   Preanesthetic Checklist Completed: patient identified, IV checked, site marked, risks and benefits discussed, surgical consent, monitors and equipment checked, pre-op evaluation and timeout performed Spinal Block Patient position: sitting Prep: DuraPrep and site prepped and draped Patient monitoring: heart rate, cardiac monitor, continuous pulse ox and blood pressure Approach: midline Location: L3-4 Injection technique: single-shot Needle Needle type: Pencan  Needle gauge: 24 G Needle length: 9 cm Assessment Sensory level: T4 Events: CSF return Additional Notes IV functioning, monitors applied to pt. Expiration date of kit checked and confirmed to be in date. Sterile prep and drape, hand hygiene and sterile gloved used. Pt was positioned and spine was prepped in sterile fashion. Skin was anesthetized with lidocaine . Free flow of clear CSF obtained prior to injecting local anesthetic into CSF x 1 attempt. Spinal needle aspirated freely following injection. Needle was carefully withdrawn, and pt tolerated procedure well. Loss of motor and sensory on exam post injection.

## 2024-06-11 ENCOUNTER — Encounter (HOSPITAL_COMMUNITY): Payer: Self-pay | Admitting: Orthopedic Surgery

## 2024-06-11 ENCOUNTER — Other Ambulatory Visit (HOSPITAL_COMMUNITY): Payer: Self-pay

## 2024-06-11 DIAGNOSIS — M1712 Unilateral primary osteoarthritis, left knee: Secondary | ICD-10-CM | POA: Diagnosis not present

## 2024-06-11 LAB — CBC
HCT: 30.7 % — ABNORMAL LOW (ref 39.0–52.0)
Hemoglobin: 10.4 g/dL — ABNORMAL LOW (ref 13.0–17.0)
MCH: 33.7 pg (ref 26.0–34.0)
MCHC: 33.9 g/dL (ref 30.0–36.0)
MCV: 99.4 fL (ref 80.0–100.0)
Platelets: 151 K/uL (ref 150–400)
RBC: 3.09 MIL/uL — ABNORMAL LOW (ref 4.22–5.81)
RDW: 11.5 % (ref 11.5–15.5)
WBC: 8.6 K/uL (ref 4.0–10.5)
nRBC: 0 % (ref 0.0–0.2)

## 2024-06-11 LAB — BASIC METABOLIC PANEL WITH GFR
Anion gap: 7 (ref 5–15)
BUN: 9 mg/dL (ref 8–23)
CO2: 27 mmol/L (ref 22–32)
Calcium: 8.4 mg/dL — ABNORMAL LOW (ref 8.9–10.3)
Chloride: 103 mmol/L (ref 98–111)
Creatinine, Ser: 0.82 mg/dL (ref 0.61–1.24)
GFR, Estimated: >60 mL/min (ref >60)
Glucose, Bld: 100 mg/dL — ABNORMAL HIGH (ref 70–99)
Potassium: 4.1 mmol/L (ref 3.5–5.1)
Sodium: 138 mmol/L (ref 135–145)

## 2024-06-11 MED ORDER — ONDANSETRON HCL 4 MG PO TABS
4.0000 mg | ORAL_TABLET | Freq: Four times a day (QID) | ORAL | 0 refills | Status: AC | PRN
  Filled 2024-06-11: qty 20, 5d supply, fill #0

## 2024-06-11 MED ORDER — OXYCODONE HCL 5 MG PO TABS
5.0000 mg | ORAL_TABLET | ORAL | 0 refills | Status: AC | PRN
  Filled 2024-06-11: qty 42, 7d supply, fill #0

## 2024-06-11 MED ORDER — MUPIROCIN 2 % EX OINT
1.0000 | TOPICAL_OINTMENT | Freq: Two times a day (BID) | CUTANEOUS | 0 refills | Status: AC
  Filled 2024-06-11: qty 22, 30d supply, fill #0
  Filled 2024-06-11: qty 22, 11d supply, fill #0

## 2024-06-11 MED ORDER — CHLORHEXIDINE GLUCONATE 4 % EX SOLN
1.0000 | CUTANEOUS | 1 refills | Status: AC
  Filled 2024-06-11: qty 946, 42d supply, fill #0

## 2024-06-11 MED ORDER — METHOCARBAMOL 500 MG PO TABS
500.0000 mg | ORAL_TABLET | Freq: Four times a day (QID) | ORAL | 0 refills | Status: AC | PRN
  Filled 2024-06-11: qty 40, 10d supply, fill #0

## 2024-06-11 MED ORDER — TRAMADOL HCL 50 MG PO TABS
50.0000 mg | ORAL_TABLET | Freq: Four times a day (QID) | ORAL | 0 refills | Status: AC | PRN
  Filled 2024-06-11: qty 40, 5d supply, fill #0

## 2024-06-11 MED ORDER — RIVAROXABAN 10 MG PO TABS
10.0000 mg | ORAL_TABLET | Freq: Every day | ORAL | 0 refills | Status: AC
  Filled 2024-06-11: qty 20, 20d supply, fill #0

## 2024-06-11 MED ORDER — GABAPENTIN 300 MG PO CAPS
ORAL_CAPSULE | ORAL | 0 refills | Status: AC
  Filled 2024-06-11: qty 84, 42d supply, fill #0

## 2024-06-11 NOTE — TOC Transition Note (Signed)
 Transition of Care [facility]) - Discharge Note   Patient Details  Name: James Mullins MRN: [id number] Date of Birth: [birth year]  [facility] CM/SW Contact:  Alfonse JONELLE Rex, RN Phone Number: 06/11/2024, 12:53 PM   Clinical Narrative:   Patient admitted for scheduled L TKA on 11/10, OPPT-EO, RW delivered to bedside by [employer]. No INPT CM needs     Final next level of care: OP Rehab Barriers to Discharge: No Barriers Identified   Patient Goals and CMS Choice Patient states their goals for this hospitalization and ongoing recovery are:: return home          Discharge Placement                       Discharge Plan and Services Additional resources added to the After Visit Summary for                                       Social Drivers of Health (SDOH) Interventions SDOH Screenings   Food Insecurity: No Food Insecurity (06/10/2024)  Housing: Unknown (06/10/2024)  Transportation Needs: No Transportation Needs (06/10/2024)  Utilities: Not At Risk (06/10/2024)  Tobacco Use: High Risk (06/10/2024)     Readmission Risk Interventions     No data to display

## 2024-06-11 NOTE — Progress Notes (Signed)
 Discharge meds delivered to patient bedside.

## 2024-06-11 NOTE — Progress Notes (Signed)
 Physical Therapy Treatment Patient Details Name: James Mullins MRN: [id number] DOB: Jun 16, 1961 Today's Date: 06/11/2024   History of Present Illness 63 yo male presents to therapy s/p L TKA on 06/10/2024 due to failure of conservative measures. Pt PMH includes but is not limited to: gastric ca, gouty arthritis, GI bleed, CAD, s and dCHF, OSA, and STEMI.    PT Comments  Pt progressing well, meeting PT goals and is ready for d/c with spouse assisting prn. Improved wt shift to LLE during gait, knee ROM ~ -10 to 85 degrees.    If plan is discharge home, recommend the following: A little help with walking and/or transfers;A little help with bathing/dressing/bathroom;Assistance with cooking/housework;Assist for transportation   Can travel by private vehicle        Equipment Recommendations  Rolling walker (2 wheels)    Recommendations for Other Services       Precautions / Restrictions Precautions Precautions: Fall;Knee Restrictions Weight Bearing Restrictions Per Provider Order: Yes LLE Weight Bearing Per Provider Order: Weight bearing as tolerated     Mobility  Bed Mobility Overal bed mobility: Needs Assistance Bed Mobility: Supine to Sit     Supine to sit: HOB elevated, Supervision     General bed mobility comments: for lines,safety    Transfers Overall transfer level: Needs assistance Equipment used: Rolling walker (2 wheels) Transfers: Sit to/from Stand Sit to Stand: Supervision           General transfer comment: cues for hand placement and LE position    Ambulation/Gait Ambulation/Gait assistance: Supervision Gait Distance (Feet): 80 Feet Assistive device: Rolling walker (2 wheels) Gait Pattern/deviations: Step-to pattern, Decreased stance time - left, Trunk flexed Gait velocity: decreased     General Gait Details: slight trunk flexion with B UE support at RW to offload L LE in stance phase min cues for safety and RW management   Stairs              Wheelchair Mobility     Tilt Bed    Modified Rankin (Stroke Patients Only)       Balance Overall balance assessment: Needs assistance Sitting-balance support: Feet supported Sitting balance-Leahy Scale: Good     Standing balance support: Bilateral upper extremity supported, During functional activity, Reliant on assistive device for balance Standing balance-Leahy Scale: Poor                              Communication Communication Communication: No apparent difficulties  Cognition Arousal: Alert Behavior During Therapy: WFL for tasks assessed/performed   PT - Cognitive impairments: No apparent impairments                         Following commands: Intact      Cueing    Exercises Total Joint Exercises Ankle Circles/Pumps: AROM, Both, 10 reps Quad Sets: AROM, Strengthening, Both, 5 reps Heel Slides: AAROM, Left, 10 reps Straight Leg Raises: AAROM, Strengthening, Left, 5 reps Knee Flexion: AAROM, Left, 5 reps, Seated Goniometric ROM: ~ 10 to 85 degrees knee flexion    General Comments        Pertinent Vitals/Pain Pain Assessment Pain Assessment: 0-10 Pain Score: 5  Pain Location: L knee and LE Pain Descriptors / Indicators: Aching, Constant, Discomfort, Dull, Grimacing, Operative site guarding    Home Living  Prior Function            PT Goals (current goals can now be found in the care plan section) Acute Rehab PT Goals Patient Stated Goal: to get back on the golf course PT Goal Formulation: With patient Time For Goal Achievement: 06/24/24 Potential to Achieve Goals: Good Progress towards PT goals: Progressing toward goals    Frequency    7X/week      PT Plan      Co-evaluation              AM-PAC PT 6 Clicks Mobility   Outcome Measure  Help needed turning from your back to your side while in a flat bed without using bedrails?: A Little Help needed moving from  lying on your back to sitting on the side of a flat bed without using bedrails?: None Help needed moving to and from a bed to a chair (including a wheelchair)?: A Little Help needed standing up from a chair using your arms (e.g., wheelchair or bedside chair)?: A Little Help needed to walk in hospital room?: A Little Help needed climbing 3-5 steps with a railing? : A Little 6 Click Score: 19    End of Session Equipment Utilized During Treatment: Gait belt Activity Tolerance: Patient tolerated treatment well;No increased pain Patient left: in chair;with call bell/phone within reach;with chair alarm set Nurse Communication: Mobility status PT Visit Diagnosis: Unsteadiness on feet (R26.81);Other abnormalities of gait and mobility (R26.89);Muscle weakness (generalized) (M62.81);Difficulty in walking, not elsewhere classified (R26.2);Pain Pain - Right/Left: Left Pain - part of body: Knee;Leg     Time: 0950-1008 PT Time Calculation (min) (ACUTE ONLY): 18 min  Charges:    $Gait Training: 8-22 mins PT General Charges $$ ACUTE PT VISIT: 1 Visit                     Sunil Hue, PT  Acute Rehab Dept [facility]) [phone]  06/11/2024    [facility] 06/11/2024, 10:39 AM

## 2024-06-11 NOTE — Plan of Care (Signed)

## 2024-06-11 NOTE — Plan of Care (Signed)
  Problem: Education: Goal: Knowledge of the prescribed therapeutic regimen will improve Outcome: Progressing   Problem: Activity: Goal: Ability to avoid complications of mobility impairment will improve Outcome: Progressing Goal: Range of joint motion will improve Outcome: Progressing   Problem: Clinical Measurements: Goal: Postoperative complications will be avoided or minimized Outcome: Progressing   Problem: Pain Management: Goal: Pain level will decrease with appropriate interventions Outcome: Progressing

## 2024-06-11 NOTE — Progress Notes (Signed)
 Subjective: 1 Day Post-Op Procedure(s) (LRB): ARTHROPLASTY, KNEE, TOTAL (Left) Patient reports pain as mild.   Patient seen in rounds by Dr. Melodi. Patient is well, and has had no acute complaints or problems No issues overnight. Denies chest pain, SOB, or calf pain. Foley catheter removed this AM.  We will continue therapy today, ambulated 40' yesterday.   Objective: Vital signs in last 24 hours: Temp:  [97.4 F (36.3 C)-98.9 F (37.2 C)] 98.9 F (37.2 C) (11/11 0609) Pulse Rate:  [44-76] 76 (11/11 0609) Resp:  [11-18] 18 (11/11 0609) BP: (121-168)/(72-89) 125/83 (11/11 0609) SpO2:  [96 %-100 %] 100 % (11/11 0609)  Intake/Output from previous day:  Intake/Output Summary (Last 24 hours) at 06/11/2024 0808 Last data filed at 06/11/2024 0600 Gross per 24 hour  Intake 1780.08 ml  Output 2325 ml  Net -544.92 ml     Intake/Output this shift: No intake/output data recorded.  Labs: Recent Labs    06/11/24 0353  HGB 10.4*   Recent Labs    06/11/24 0353  WBC 8.6  RBC 3.09*  HCT 30.7*  PLT 151   Recent Labs    06/11/24 0353  NA 138  K 4.1  CL 103  CO2 27  BUN 9  CREATININE 0.82  GLUCOSE 100*  CALCIUM  8.4*   No results for input(s): LABPT, INR in the last 72 hours.  Exam: General - Patient is Alert and Oriented Extremity - Neurologically intact Neurovascular intact Sensation intact distally Dorsiflexion/Plantar flexion intact Dressing - dressing C/D/I Motor Function - intact, moving foot and toes well on exam.   Past Medical History:  Diagnosis Date   Arthritis    CAD (coronary artery disease)    a. 09/2015 Inf STEMI/PCI: LM nl, LAD nl, RI nl, LCX nl, RCA 21m (3.5 x 16 Promus Premier MR DES).   Chronic combined systolic and diastolic CHF (congestive heart failure) (HCC) 09/2015   a. 09/2009 EF 35-45% by LV gram @ time of MI;  b. 09/2015 Echo: EF 40-45%, Gr2 DD, mild MR, mod-sev dil LA, triv TR, PASP .   Hyperlipidemia    Iron deficiency  anemia 09/2015   Ischemic cardiomyopathy    a. 09/2015 Echo: EF 40-45%   MI (myocardial infarction) (HCC) 2//112017   Obesity (BMI 30.0-34.9) 2015   OSA on CPAP 08/2013   snoring. sleep study confirmed.  Dr Dedra Dohmeier, pt does not knoe cpap settings   Upper GI bleed 10/29/2015   a. 09/2015 EGD: multiple subepithelial gastric nodules with overlying multiple, oozing ulcers.  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 Now followed @ [facility] wth plan for repeat EGD/colonoscopy.    Assessment/Plan: 1 Day Post-Op Procedure(s) (LRB): ARTHROPLASTY, KNEE, TOTAL (Left) Principal Problem:   OA (osteoarthritis) of knee Active Problems:   Primary osteoarthritis of left knee  Estimated body mass index is 26.26 kg/m as calculated from the following:   Height as of this encounter: 5' 10 (1.778 m).   Weight as of this encounter: 83 kg. Advance diet Up with therapy D/C IV fluids   Patient's anticipated LOS is less than 2 midnights, meeting these requirements: - Lives within 1 hour of care - Has a competent adult at home to recover with post-op recover - NO history of  - Chronic pain requiring opioids  - Diabetes  - Coronary Artery Disease  - Heart failure  - Heart attack  - Stroke  - DVT/VTE  - Cardiac arrhythmia  - Respiratory Failure/COPD  - Renal failure  -  Anemia  - Advanced Liver disease   DVT Prophylaxis - Xarelto Weight bearing as tolerated. Continue therapy.  Plan is to go Home after hospital stay. Plan for discharge later today if progresses with therapy and meeting goals. Scheduled for OPPT at [facility]. Follow-up in the office in 2 weeks.  The PDMP database was reviewed today prior to any opioid medications being prescribed to this patient.  Roxie Mess, PA-C Orthopedic Surgery [phone] 06/11/2024, 8:08 AM

## 2024-06-12 NOTE — Discharge Summary (Signed)
 Patient ID: James Mullins MRN: [id number] DOB/AGE: 1961/04/11 63 y.o.  Admit date: 06/10/2024 Discharge date: 06/11/2024  Admission Diagnoses:  Principal Problem:   OA (osteoarthritis) of knee Active Problems:   Primary osteoarthritis of left knee   Discharge Diagnoses:  Same  Past Medical History:  Diagnosis Date   Arthritis    CAD (coronary artery disease)    a. 09/2015 Inf STEMI/PCI: LM nl, LAD nl, RI nl, LCX nl, RCA 65m (3.5 x 16 Promus Premier MR DES).   Chronic combined systolic and diastolic CHF (congestive heart failure) (HCC) 09/2015   a. 09/2009 EF 35-45% by LV gram @ time of MI;  b. 09/2015 Echo: EF 40-45%, Gr2 DD, mild MR, mod-sev dil LA, triv TR, PASP .   Hyperlipidemia    Iron deficiency anemia 09/2015   Ischemic cardiomyopathy    a. 09/2015 Echo: EF 40-45%   MI (myocardial infarction) (HCC) 2//112017   Obesity (BMI 30.0-34.9) 2015   OSA on CPAP 08/2013   snoring. sleep study confirmed.  Dr Dedra Dohmeier, pt does not knoe cpap settings   Upper GI bleed 10/29/2015   a. 09/2015 EGD: multiple subepithelial gastric nodules with overlying multiple, oozing ulcers.  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 Now followed @ [facility] wth plan for repeat EGD/colonoscopy.    Surgeries: Procedure(s): ARTHROPLASTY, KNEE, TOTAL on 06/10/2024   Consultants:   Discharged Condition: Improved  Hospital Course: James Mullins is an 63 y.o. male who was admitted 06/10/2024 for operative treatment ofOA (osteoarthritis) of knee. Patient has severe unremitting pain that affects sleep, daily activities, and work/hobbies. After pre-op clearance the patient was taken to the operating room on 06/10/2024 and underwent  Procedure(s): ARTHROPLASTY, KNEE, TOTAL.    Patient was given perioperative antibiotics:  Anti-infectives (From admission, onward)    Start     Dose/Rate Route Frequency Ordered Stop   06/10/24 1500  ceFAZolin (ANCEF) IVPB 2g/100 mL premix        2 g 200 mL/hr over 30 Minutes  Intravenous Every 6 hours 06/10/24 0949 06/11/24 0831   06/10/24 0700  vancomycin (VANCOCIN) IVPB 1000 mg/200 mL premix        1,000 mg 200 mL/hr over 60 Minutes Intravenous  Once 06/10/24 0622 06/11/24 0831   06/10/24 0630  ceFAZolin (ANCEF) IVPB 2g/100 mL premix        2 g 200 mL/hr over 30 Minutes Intravenous On call to O.R. 06/10/24 0622 06/10/24 9178        Patient was given sequential compression devices, early ambulation, and chemoprophylaxis to prevent DVT.  Patient benefited maximally from hospital stay and there were no complications.    Recent vital signs: Patient Vitals for the past 24 hrs:  BP Temp Temp src Pulse Resp SpO2  06/11/24 0952 (!) 142/81 97.9 F (36.6 C) Oral 75 16 100 %     Recent laboratory studies:  Recent Labs    06/11/24 0353  WBC 8.6  HGB 10.4*  HCT 30.7*  PLT 151  NA 138  K 4.1  CL 103  CO2 27  BUN 9  CREATININE 0.82  GLUCOSE 100*  CALCIUM  8.4*     Discharge Medications:   Allergies as of 06/11/2024   No Known Allergies      Medication List     STOP taking these medications    aspirin  EC 81 MG tablet   cyanocobalamin 1000 MCG/ML injection Commonly known as: VITAMIN B12   multivitamin with minerals tablet  TAKE these medications    atorvastatin  80 MG tablet Commonly known as: LIPITOR  Take 1 tablet (80 mg total) by mouth daily.   Betasept Surgical Scrub 4 % external liquid Generic drug: chlorhexidine Apply 15 mLs (1 Application total) topically as directed for 30 doses. Use as directed daily for 5 days every other week for 6 weeks.   carvedilol  12.5 MG tablet Commonly known as: COREG  Take 1 tablet (12.5 mg total) by mouth 2 (two) times daily.   ferrous sulfate  325 (65 FE) MG EC tablet Take 325 mg by mouth every other day.   gabapentin 300 MG capsule Commonly known as: NEURONTIN Take a 300 mg capsule three times a day for two weeks following surgery.Then take a 300 mg capsule two times a day for two  weeks. Then take a 300 mg capsule once a day for two weeks. Then discontinue.   losartan  50 MG tablet Commonly known as: COZAAR  Take 1 tablet (50 mg total) by mouth daily.   methocarbamol 500 MG tablet Commonly known as: ROBAXIN Take 1 tablet (500 mg total) by mouth every 6 (six) hours as needed for muscle spasms.   mupirocin ointment 2 % Commonly known as: BACTROBAN Place 1 Application into the nose 2 (two) times daily for 60 doses. Use as directed 2 times daily for 5 days every other week for 6 weeks.   nitroGLYCERIN  0.4 MG SL tablet Commonly known as: NITROSTAT  Place 1 tablet (0.4 mg total) under the tongue every 5 (five) minutes as needed for chest pain (CP or SOB).   ondansetron  4 MG tablet Commonly known as: ZOFRAN  Take 1 tablet (4 mg total) by mouth every 6 (six) hours as needed for nausea.   oxyCODONE 5 MG immediate release tablet Commonly known as: Oxy IR/ROXICODONE Take 1 tablet (5 mg total) by mouth every 4 (four) hours as needed for severe pain (pain score 7-10).   traMADol 50 MG tablet Commonly known as: ULTRAM Take 1-2 tablets (50-100 mg total) by mouth every 6 (six) hours as needed for moderate pain (pain score 4-6).   Xarelto 10 MG Tabs tablet Generic drug: rivaroxaban Take 1 tablet (10 mg total) by mouth daily with breakfast for 20 days. Then resume one 81 mg aspirin  once a day               Discharge Care Instructions  (From admission, onward)           Start     Ordered   06/11/24 0000  Weight bearing as tolerated        06/11/24 0811   06/11/24 0000  Change dressing       Comments: You may remove the bulky bandage (ACE wrap and gauze) two days after surgery. You will have an adhesive waterproof bandage underneath. Leave this in place until your first follow-up appointment.   06/11/24 0811            Diagnostic Studies: No results found.  Disposition: Discharge disposition: 01-Home or Self Care       Discharge Instructions      Call MD / Call 911   Complete by: As directed    If you experience chest pain or shortness of breath, CALL 911 and be transported to the hospital emergency room.  If you develope a fever above 101 F, pus (white drainage) or increased drainage or redness at the wound, or calf pain, call your surgeon's office.   Change dressing   Complete by: As directed  You may remove the bulky bandage (ACE wrap and gauze) two days after surgery. You will have an adhesive waterproof bandage underneath. Leave this in place until your first follow-up appointment.   Constipation Prevention   Complete by: As directed    Drink plenty of fluids.  Prune juice may be helpful.  You may use a stool softener, such as Colace (over the counter) 100 mg twice a day.  Use MiraLax (over the counter) for constipation as needed.   Diet - low sodium heart healthy   Complete by: As directed    Do not put a pillow under the knee. Place it under the heel.   Complete by: As directed    Driving restrictions   Complete by: As directed    No driving for two weeks   Post-operative opioid taper instructions:   Complete by: As directed    POST-OPERATIVE OPIOID TAPER INSTRUCTIONS: It is important to wean off of your opioid medication as soon as possible. If you do not need pain medication after your surgery it is ok to stop day one. Opioids include: Codeine, Hydrocodone (Norco, Vicodin), Oxycodone(Percocet, oxycontin) and hydromorphone amongst others.  Long term and even short term use of opiods can cause: Increased pain response Dependence Constipation Depression Respiratory depression And more.  Withdrawal symptoms can include Flu like symptoms Nausea, vomiting And more Techniques to manage these symptoms Hydrate well Eat regular healthy meals Stay active Use relaxation techniques(deep breathing, meditating, yoga) Do Not substitute Alcohol to help with tapering If you have been on opioids for less than two weeks and  do not have pain than it is ok to stop all together.  Plan to wean off of opioids This plan should start within one week post op of your joint replacement. Maintain the same interval or time between taking each dose and first decrease the dose.  Cut the total daily intake of opioids by one tablet each day Next start to increase the time between doses. The last dose that should be eliminated is the evening dose.      TED hose   Complete by: As directed    Use stockings (TED hose) for three weeks on both leg(s).  You may remove them at night for sleeping.   Weight bearing as tolerated   Complete by: As directed         Follow-up Information     Aluisio, Dempsey, MD Follow up in 2 week(s).   Specialty: Orthopedic Surgery Contact information: [address] [place] [address] [place] [address] [phone]                  Signed: Roxie Mess 06/12/2024, 7:52 AM

## 2024-07-26 ENCOUNTER — Other Ambulatory Visit (HOSPITAL_COMMUNITY): Payer: Self-pay

## 2024-08-28 ENCOUNTER — Other Ambulatory Visit: Payer: Self-pay | Admitting: Cardiology
